# Patient Record
Sex: Female | Born: 1935 | Race: White | Hispanic: No | Smoking: Former smoker
Health system: Southern US, Community
[De-identification: ages and names within clinical notes are randomized; demographics above are authoritative.]

## PROBLEM LIST (undated history)

## (undated) DIAGNOSIS — I10 Essential (primary) hypertension: Secondary | ICD-10-CM

## (undated) DIAGNOSIS — I609 Nontraumatic subarachnoid hemorrhage, unspecified: Secondary | ICD-10-CM

## (undated) DIAGNOSIS — I671 Cerebral aneurysm, nonruptured: Secondary | ICD-10-CM

## (undated) DIAGNOSIS — H5440 Blindness, one eye, unspecified eye: Secondary | ICD-10-CM

## (undated) DIAGNOSIS — F329 Major depressive disorder, single episode, unspecified: Secondary | ICD-10-CM

## (undated) DIAGNOSIS — D7581 Myelofibrosis: Secondary | ICD-10-CM

## (undated) DIAGNOSIS — Z720 Tobacco use: Secondary | ICD-10-CM

## (undated) DIAGNOSIS — F32A Depression, unspecified: Secondary | ICD-10-CM

## (undated) HISTORY — PX: CATARACT EXTRACTION: SUR2

## (undated) HISTORY — PX: CHOLECYSTECTOMY: SHX55

## (undated) HISTORY — DX: Myelofibrosis: D75.81

## (undated) HISTORY — PX: ABDOMINAL HYSTERECTOMY: SHX81

## (undated) HISTORY — PX: ANEURYSM COILING: SHX5349

---

## 1998-02-20 ENCOUNTER — Ambulatory Visit (HOSPITAL_COMMUNITY): Admission: RE | Admit: 1998-02-20 | Discharge: 1998-02-20 | Payer: Self-pay

## 2004-04-02 ENCOUNTER — Ambulatory Visit: Payer: Self-pay | Admitting: Unknown Physician Specialty

## 2004-09-01 ENCOUNTER — Ambulatory Visit: Payer: Self-pay | Admitting: Internal Medicine

## 2005-05-04 ENCOUNTER — Ambulatory Visit: Payer: Self-pay | Admitting: Unknown Physician Specialty

## 2006-02-07 DIAGNOSIS — I609 Nontraumatic subarachnoid hemorrhage, unspecified: Secondary | ICD-10-CM

## 2006-02-07 HISTORY — DX: Nontraumatic subarachnoid hemorrhage, unspecified: I60.9

## 2006-03-03 ENCOUNTER — Inpatient Hospital Stay (HOSPITAL_COMMUNITY): Admission: EM | Admit: 2006-03-03 | Discharge: 2006-03-24 | Payer: Self-pay | Admitting: Neurosurgery

## 2006-03-03 ENCOUNTER — Ambulatory Visit: Payer: Self-pay | Admitting: Internal Medicine

## 2006-03-03 ENCOUNTER — Emergency Department: Payer: Self-pay | Admitting: Internal Medicine

## 2006-03-24 ENCOUNTER — Inpatient Hospital Stay (HOSPITAL_COMMUNITY)
Admission: RE | Admit: 2006-03-24 | Discharge: 2006-04-05 | Payer: Self-pay | Admitting: Physical Medicine & Rehabilitation

## 2006-03-24 ENCOUNTER — Ambulatory Visit: Payer: Self-pay | Admitting: Physical Medicine & Rehabilitation

## 2006-04-10 ENCOUNTER — Encounter: Payer: Self-pay | Admitting: Physical Medicine & Rehabilitation

## 2006-04-20 ENCOUNTER — Encounter: Payer: Self-pay | Admitting: Interventional Radiology

## 2006-05-04 ENCOUNTER — Ambulatory Visit: Payer: Self-pay | Admitting: Physical Medicine & Rehabilitation

## 2006-05-04 ENCOUNTER — Encounter
Admission: RE | Admit: 2006-05-04 | Discharge: 2006-08-02 | Payer: Self-pay | Admitting: Physical Medicine & Rehabilitation

## 2006-05-09 ENCOUNTER — Encounter: Payer: Self-pay | Admitting: Physical Medicine & Rehabilitation

## 2006-05-19 ENCOUNTER — Ambulatory Visit (HOSPITAL_COMMUNITY): Admission: RE | Admit: 2006-05-19 | Discharge: 2006-05-19 | Payer: Self-pay | Admitting: Interventional Radiology

## 2006-05-31 ENCOUNTER — Emergency Department: Payer: Self-pay | Admitting: Emergency Medicine

## 2006-06-02 ENCOUNTER — Ambulatory Visit (HOSPITAL_COMMUNITY): Admission: RE | Admit: 2006-06-02 | Discharge: 2006-06-02 | Payer: Self-pay | Admitting: Interventional Radiology

## 2006-06-09 ENCOUNTER — Emergency Department: Payer: Self-pay | Admitting: Unknown Physician Specialty

## 2006-06-12 ENCOUNTER — Encounter: Payer: Self-pay | Admitting: Physical Medicine & Rehabilitation

## 2006-08-21 ENCOUNTER — Ambulatory Visit: Payer: Self-pay | Admitting: Physical Medicine & Rehabilitation

## 2006-08-21 ENCOUNTER — Encounter
Admission: RE | Admit: 2006-08-21 | Discharge: 2006-08-22 | Payer: Self-pay | Admitting: Physical Medicine & Rehabilitation

## 2007-02-26 ENCOUNTER — Ambulatory Visit (HOSPITAL_COMMUNITY): Admission: RE | Admit: 2007-02-26 | Discharge: 2007-02-26 | Payer: Self-pay | Admitting: Interventional Radiology

## 2007-04-24 ENCOUNTER — Ambulatory Visit: Payer: Self-pay | Admitting: Ophthalmology

## 2007-05-08 ENCOUNTER — Ambulatory Visit: Payer: Self-pay | Admitting: Internal Medicine

## 2007-07-17 ENCOUNTER — Ambulatory Visit: Payer: Self-pay | Admitting: Ophthalmology

## 2007-07-23 ENCOUNTER — Ambulatory Visit: Payer: Self-pay | Admitting: Ophthalmology

## 2007-08-24 ENCOUNTER — Ambulatory Visit (HOSPITAL_COMMUNITY): Admission: RE | Admit: 2007-08-24 | Discharge: 2007-08-24 | Payer: Self-pay | Admitting: Interventional Radiology

## 2007-11-22 IMAGING — CT CT HEAD W/O CM
1 of 2 series · 15 of 30 positions shown, 19 images · non-contrast
Comparison: 03/13/2006.

CLINICAL DATA: Followup subarachnoid and intraventricular hemorrhage.

HEAD CT WITHOUT CONTRAST
TECHNIQUE: 5mm collimated images were obtained from the base of the skull
through the vertex according to standard protocol without contrast.

[Series 2: brain · axial · 0.47mm/px · z∈[+140,+271]mm · 15 of 64 slices shown, 19 images]
[im 4/64  brain]
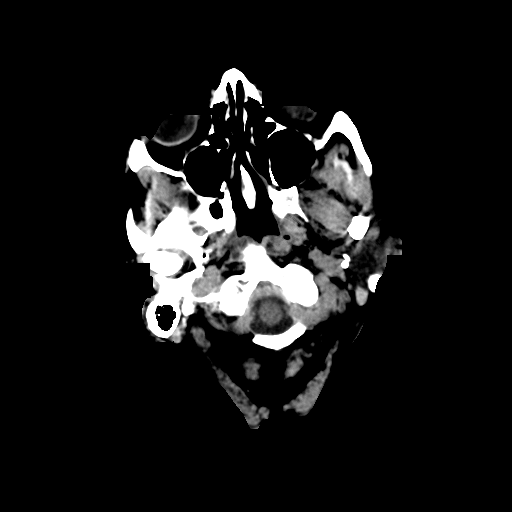
[im 4/64  bone]
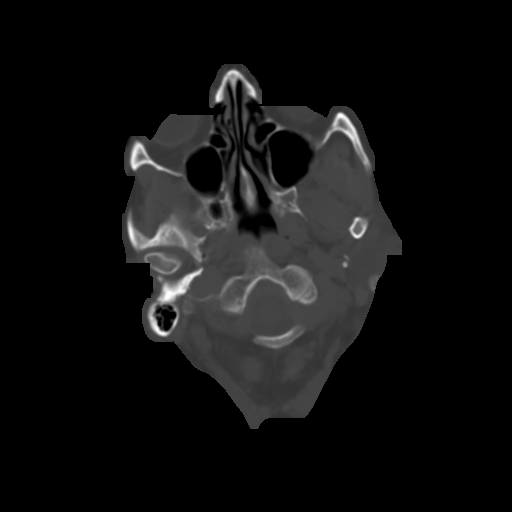
[im 8/64  brain]
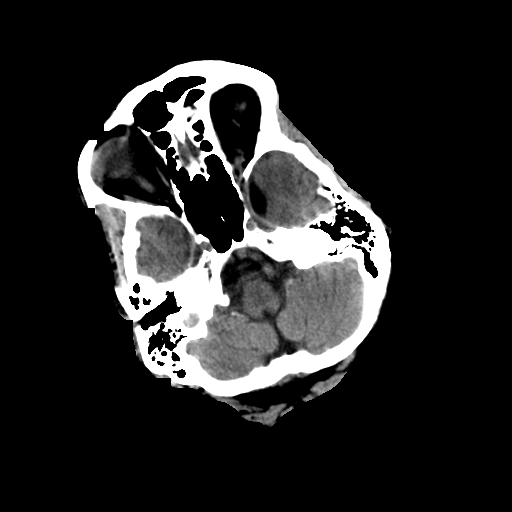
[im 12/64  brain]
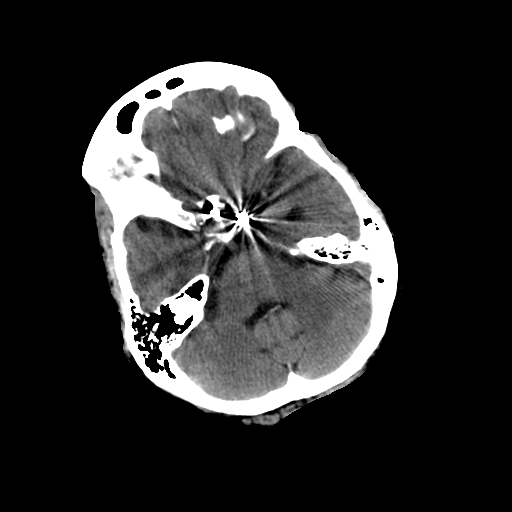
[im 16/64  brain]
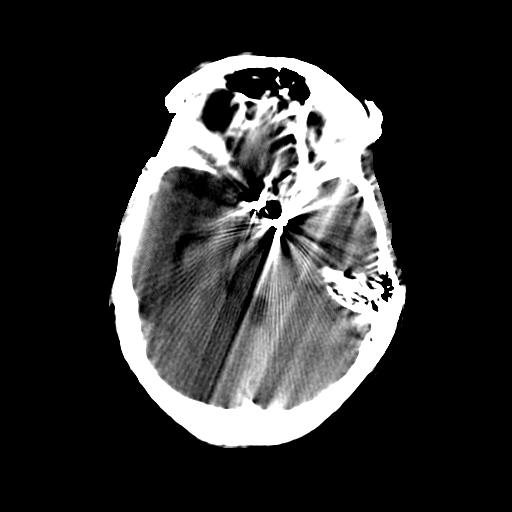
[im 20/64  brain]
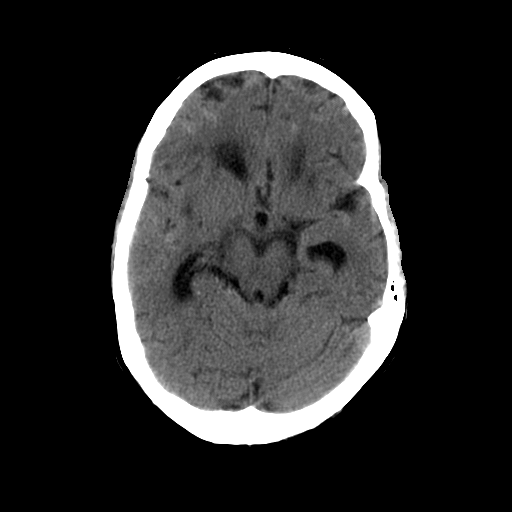
[im 20/64  bone]
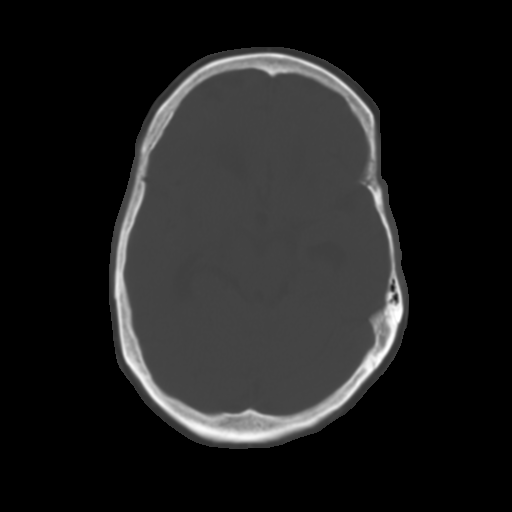
[im 24/64  brain]
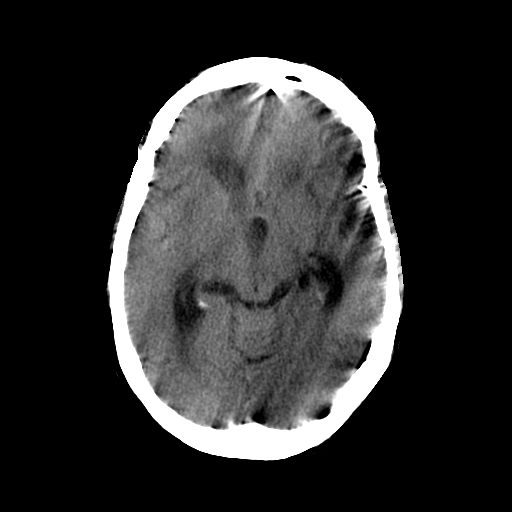
[im 28/64  brain]
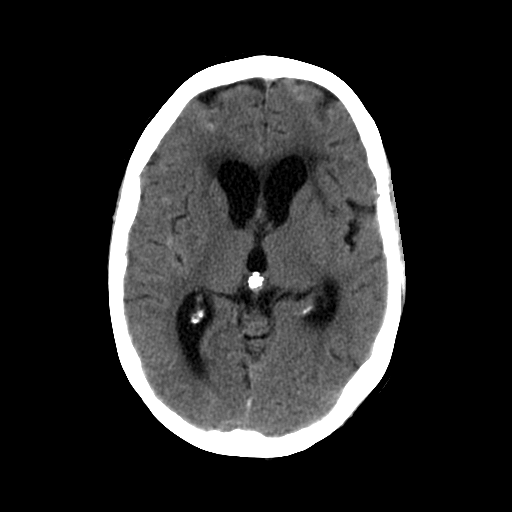
[im 32/64  brain]
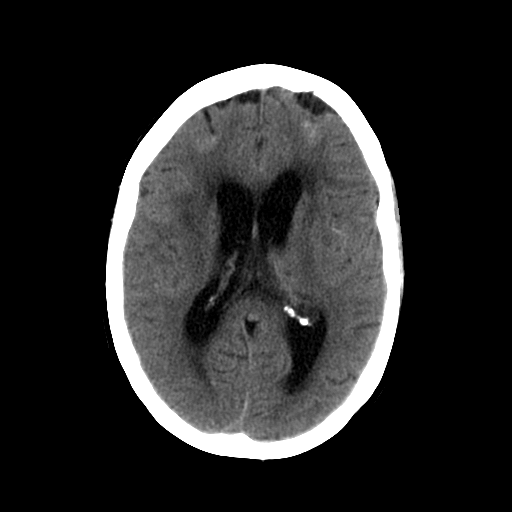
[im 36/64  brain]
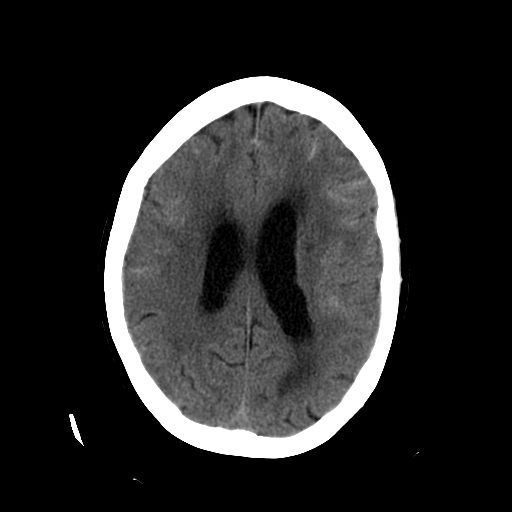
[im 36/64  bone]
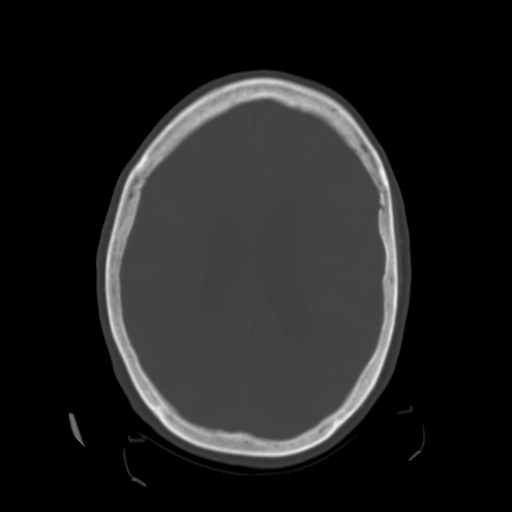
[im 40/64  brain]
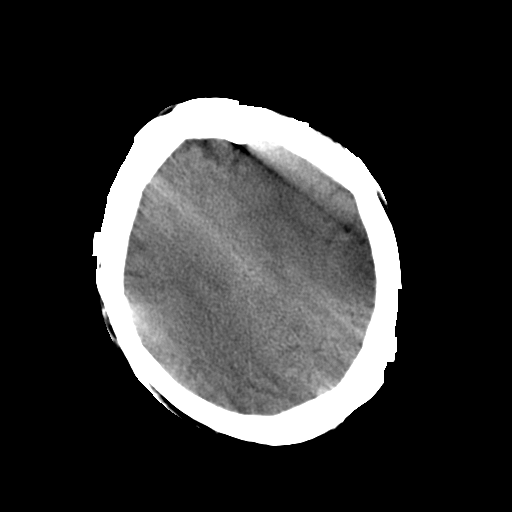
[im 44/64  brain]
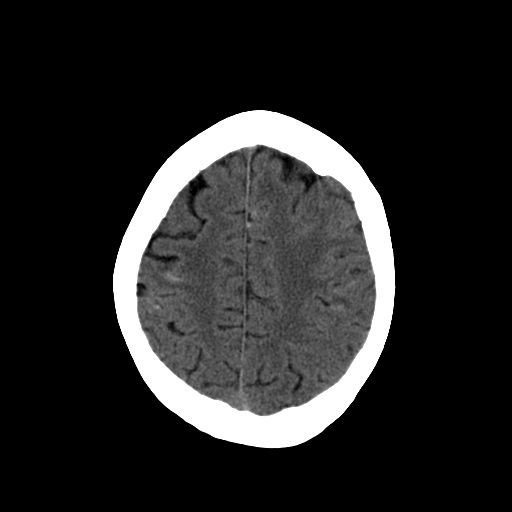
[im 48/64  brain]
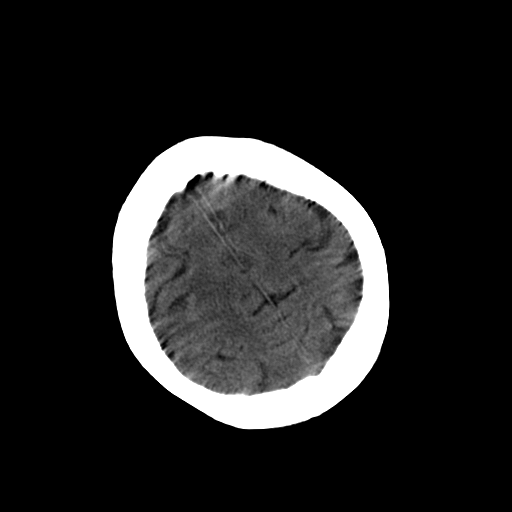
[im 52/64  brain]
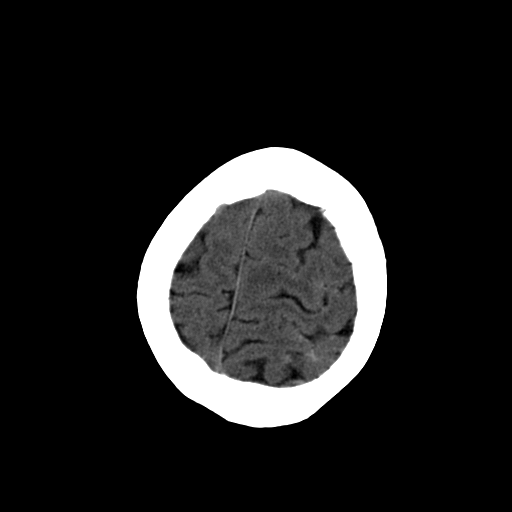
[im 52/64  bone]
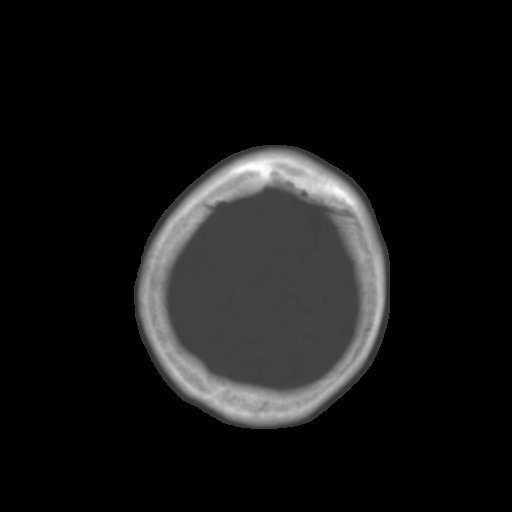
[im 56/64  brain]
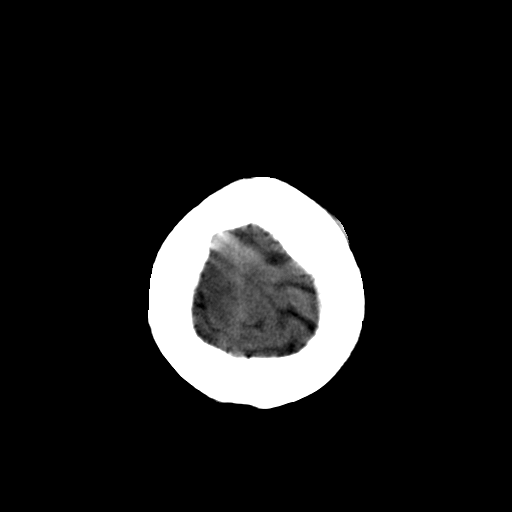
[im 60/64  brain]
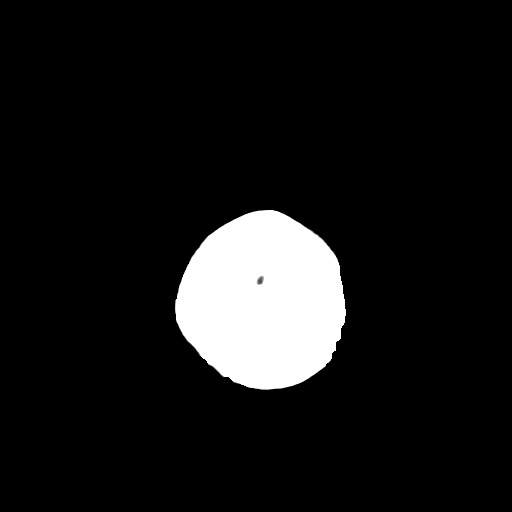

[15 of 30 positions shown; findings below may reference images not displayed]

FINDINGS: Interval decrease in subarachnoid and intraventricular hemorrhage.
Stable mild enlargement of the ventricles. Patchy white matter low density in
the cerebral hemispheres is more prominent. And left parietal lacunar infarct
and small bilateral basal ganglia lacunar infarcts are stable. No no new
hemorrhage or mass-effect. Stable aneurysm coils. A right nasal feeding tube is
in place.

IMPRESSION

1. Improving subarachnoid and intraventricular hemorrhage.
2. Stable mild hydrocephalus.
3. Progressively visible small vessel white matter ischemic changes in both
cerebral hemispheres.
4. Stable left parietal and bilateral basal ganglia lacunar infarcts.

## 2008-04-17 ENCOUNTER — Ambulatory Visit (HOSPITAL_COMMUNITY): Admission: RE | Admit: 2008-04-17 | Discharge: 2008-04-17 | Payer: Self-pay | Admitting: Interventional Radiology

## 2008-07-29 ENCOUNTER — Ambulatory Visit: Payer: Self-pay | Admitting: Internal Medicine

## 2008-08-20 ENCOUNTER — Ambulatory Visit: Payer: Self-pay | Admitting: Unknown Physician Specialty

## 2009-01-14 ENCOUNTER — Ambulatory Visit (HOSPITAL_COMMUNITY): Admission: RE | Admit: 2009-01-14 | Discharge: 2009-01-14 | Payer: Self-pay | Admitting: Interventional Radiology

## 2009-02-16 ENCOUNTER — Encounter: Payer: Self-pay | Admitting: Interventional Radiology

## 2009-04-07 ENCOUNTER — Inpatient Hospital Stay (HOSPITAL_COMMUNITY): Admission: RE | Admit: 2009-04-07 | Discharge: 2009-04-08 | Payer: Self-pay | Admitting: Interventional Radiology

## 2009-04-22 ENCOUNTER — Ambulatory Visit (HOSPITAL_COMMUNITY): Admission: RE | Admit: 2009-04-22 | Discharge: 2009-04-22 | Payer: Self-pay | Admitting: Interventional Radiology

## 2009-10-22 ENCOUNTER — Ambulatory Visit: Payer: Self-pay | Admitting: Internal Medicine

## 2009-10-30 ENCOUNTER — Ambulatory Visit (HOSPITAL_COMMUNITY): Admission: RE | Admit: 2009-10-30 | Discharge: 2009-10-30 | Payer: Self-pay | Admitting: Interventional Radiology

## 2010-04-22 LAB — CBC
MCHC: 31.7 g/dL (ref 30.0–36.0)
Platelets: 338 10*3/uL (ref 150–400)

## 2010-04-22 LAB — BASIC METABOLIC PANEL
BUN: 16 mg/dL (ref 6–23)
Chloride: 106 mEq/L (ref 96–112)
Creatinine, Ser: 1.02 mg/dL (ref 0.4–1.2)
Glucose, Bld: 98 mg/dL (ref 70–99)
Potassium: 4.2 mEq/L (ref 3.5–5.1)
Sodium: 142 mEq/L (ref 135–145)

## 2010-04-22 LAB — PROTIME-INR
INR: 0.92 (ref 0.00–1.49)
Prothrombin Time: 12.6 s (ref 11.6–15.2)

## 2010-04-22 LAB — APTT: aPTT: 30 s (ref 24–37)

## 2010-04-26 ENCOUNTER — Other Ambulatory Visit (HOSPITAL_COMMUNITY): Payer: Self-pay | Admitting: Interventional Radiology

## 2010-04-26 DIAGNOSIS — I729 Aneurysm of unspecified site: Secondary | ICD-10-CM

## 2010-04-26 DIAGNOSIS — I609 Nontraumatic subarachnoid hemorrhage, unspecified: Secondary | ICD-10-CM

## 2010-04-28 LAB — CBC
HCT: 39.9 % (ref 36.0–46.0)
MCHC: 34.4 g/dL (ref 30.0–36.0)
MCV: 95.8 fL (ref 78.0–100.0)
RBC: 4.17 MIL/uL (ref 3.87–5.11)
WBC: 8.9 10*3/uL (ref 4.0–10.5)

## 2010-04-28 LAB — DIFFERENTIAL
Basophils Relative: 0 % (ref 0–1)
Eosinophils Absolute: 0.5 10*3/uL (ref 0.0–0.7)
Eosinophils Relative: 6 % — ABNORMAL HIGH (ref 0–5)
Lymphs Abs: 1.8 10*3/uL (ref 0.7–4.0)
Monocytes Absolute: 0.5 10*3/uL (ref 0.1–1.0)
Monocytes Relative: 6 % (ref 3–12)
Neutrophils Relative %: 69 % (ref 43–77)

## 2010-04-28 LAB — BASIC METABOLIC PANEL
BUN: 16 mg/dL (ref 6–23)
CO2: 29 mEq/L (ref 19–32)
Chloride: 105 mEq/L (ref 96–112)
Creatinine, Ser: 0.88 mg/dL (ref 0.4–1.2)
GFR calc Af Amer: >60 mL/min (ref >60)
Potassium: 4.1 mEq/L (ref 3.5–5.1)

## 2010-05-02 LAB — CBC
HCT: 28.1 % — ABNORMAL LOW (ref 36.0–46.0)
Hemoglobin: 12.7 g/dL (ref 12.0–15.0)
Hemoglobin: 9.7 g/dL — ABNORMAL LOW (ref 12.0–15.0)
MCHC: 34.6 g/dL (ref 30.0–36.0)
MCV: 95.9 fL (ref 78.0–100.0)
MCV: 97.2 fL (ref 78.0–100.0)
RBC: 2.93 MIL/uL — ABNORMAL LOW (ref 3.87–5.11)
RBC: 3.83 MIL/uL — ABNORMAL LOW (ref 3.87–5.11)
WBC: 7.8 10*3/uL (ref 4.0–10.5)
WBC: 8.6 10*3/uL (ref 4.0–10.5)

## 2010-05-02 LAB — BASIC METABOLIC PANEL
CO2: 25 mEq/L (ref 19–32)
Chloride: 108 mEq/L (ref 96–112)
Creatinine, Ser: 0.73 mg/dL (ref 0.4–1.2)
GFR calc Af Amer: >60 mL/min (ref >60)
Potassium: 3.5 mEq/L (ref 3.5–5.1)
Sodium: 138 mEq/L (ref 135–145)

## 2010-05-02 LAB — PROTIME-INR: Prothrombin Time: 14.7 seconds (ref 11.6–15.2)

## 2010-05-02 LAB — HEPARIN LEVEL (UNFRACTIONATED): Heparin Unfractionated: 0.16 IU/mL — ABNORMAL LOW (ref 0.30–0.70)

## 2010-05-04 ENCOUNTER — Ambulatory Visit (HOSPITAL_COMMUNITY): Admission: RE | Admit: 2010-05-04 | Payer: MEDICARE | Source: Ambulatory Visit

## 2010-05-04 ENCOUNTER — Ambulatory Visit (HOSPITAL_COMMUNITY)
Admission: RE | Admit: 2010-05-04 | Discharge: 2010-05-04 | Disposition: A | Discharge status: Home / Self Care | Payer: MEDICARE | Source: Ambulatory Visit | Attending: Interventional Radiology | Admitting: Interventional Radiology

## 2010-05-04 ENCOUNTER — Other Ambulatory Visit (HOSPITAL_COMMUNITY): Payer: Self-pay | Admitting: Interventional Radiology

## 2010-05-04 DIAGNOSIS — I609 Nontraumatic subarachnoid hemorrhage, unspecified: Secondary | ICD-10-CM

## 2010-05-04 DIAGNOSIS — I729 Aneurysm of unspecified site: Secondary | ICD-10-CM

## 2010-05-04 DIAGNOSIS — R51 Headache: Secondary | ICD-10-CM | POA: Insufficient documentation

## 2010-05-04 DIAGNOSIS — I671 Cerebral aneurysm, nonruptured: Secondary | ICD-10-CM | POA: Insufficient documentation

## 2010-05-04 LAB — BUN: BUN: 16 mg/dL (ref 6–23)

## 2010-05-04 LAB — CREATININE, SERUM: GFR calc non Af Amer: 57 mL/min — ABNORMAL LOW (ref >60)

## 2010-05-04 MED ORDER — GADOBENATE DIMEGLUMINE 529 MG/ML IV SOLN
13.0000 mL | Freq: Once | INTRAVENOUS | Status: AC
  Administered 2010-05-04: 13 mL via INTRAVENOUS

## 2010-05-11 LAB — CBC
Hemoglobin: 14.8 g/dL (ref 12.0–15.0)
MCV: 97.1 fL (ref 78.0–100.0)
RBC: 4.47 MIL/uL (ref 3.87–5.11)
WBC: 9 10*3/uL (ref 4.0–10.5)

## 2010-05-11 LAB — PROTIME-INR
INR: 0.95 (ref 0.00–1.49)
Prothrombin Time: 12.6 seconds (ref 11.6–15.2)

## 2010-05-11 LAB — BASIC METABOLIC PANEL
Calcium: 8.9 mg/dL (ref 8.4–10.5)
Chloride: 101 mEq/L (ref 96–112)
Creatinine, Ser: 0.9 mg/dL (ref 0.4–1.2)
GFR calc Af Amer: >60 mL/min (ref >60)
Sodium: 138 mEq/L (ref 135–145)

## 2010-05-20 LAB — CBC
HCT: 41.5 % (ref 36.0–46.0)
Hemoglobin: 14.4 g/dL (ref 12.0–15.0)
RBC: 4.38 MIL/uL (ref 3.87–5.11)
RDW: 14.1 % (ref 11.5–15.5)
WBC: 10.2 10*3/uL (ref 4.0–10.5)

## 2010-05-20 LAB — BASIC METABOLIC PANEL
Calcium: 9 mg/dL (ref 8.4–10.5)
GFR calc Af Amer: >60 mL/min (ref >60)
GFR calc non Af Amer: >60 mL/min (ref >60)
Sodium: 142 mEq/L (ref 135–145)

## 2010-05-20 LAB — PROTIME-INR: INR: 1 (ref 0.00–1.49)

## 2010-05-20 LAB — APTT: aPTT: 29 seconds (ref 24–37)

## 2010-06-22 NOTE — Assessment & Plan Note (Signed)
FOLLOWUP OFFICE NOTE   [name] is back regarding her left subarachnoid hemorrhage.  She is now  home alone and doing well.  She denies pain.  She has had some extra  stress related to her son, who has been very ill and staying with her.  She has driven some short distances without problems.  She has had no  problem coming off the Ritalin.  She still has some issues with day-to-  day memory, but apparently is safe within the house according to the  daughter.  Blood pressure has been improved.   REVIEW OF SYSTEMS:  Notable for occasional bowel and bladder control  problems.  Other pertinent positives listed below and full review is in  the written health and history section.   SOCIAL HISTORY:  The patient is widowed.   PHYSICAL EXAM:  Blood pressure 151/68, pulse is 66, respiratory rate is  18.  She is satting 95% on room air.  The patient is pleasant, alert and oriented x3.  Affect is bright and  appropriate.  She occasionally has some word-finding issues, but this is  minimal.  She has some issues with attention and focus at times, but usually when  she takes her time and settles down, she does fairly well.  Reflexes are  2+.  Coordination is excellent.  Sensation is intact.  She really has  minimal side-to-side differences in strength and really was near 5/5  bilaterally.  She had some difficulty with sequencing some numbers  today, but when she took her time, she was able to perform the task.  She was able to spell the word world forward and backwards without  significant problems.  She displayed fair insight and awareness overall.   ASSESSMENT:  1. Subarachnoid hemorrhage.  2. Hypertension.  3. History of depression.   PLAN:  1. The patient is doing well at this point.  I gave her permission to      drive locally during the day.  2. Encourage use of regular memory book/calendar, which she needs to      keep on her person throughout the day.  She still has lapses in  short term memory and attention, which this will help.  3. I will see her back on an as needed basis in the future.  She will      follow up with Dr. Clearance Coots for further medical care.      Ranelle Oyster, M.D.  Electronically Signed     ZTS/MedQ  D:  08/22/2006 13:10:23  T:  08/22/2006 22:00:01  Job #:  [id number]   cc:   Diona Fanti  Fax: [phone]

## 2010-06-25 NOTE — Discharge Summary (Signed)
[name], [name]            ACCOUNT NO.:  [id number]   MEDICAL RECORD NO.:  [id number]          PATIENT TYPE:  INP   LOCATION:  3103                         FACILITY:  [facility]   PHYSICIAN:  Clydene Fake, M.D.  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   PROCEDURES PERFORMED:  Coiling of both aneurysms.   REASON FOR ADMISSION:  The patient is a 77 year old woman with sudden  headache, nausea and vomiting, with lethargy.  [facility] to [name]ency room.  CT scan showed subarachnoid hemorrhage and the patient  was transferred to [facility] and admitted to the ICU.   HOSPITAL COURSE:  The patient was lethargic, but arousable, followed  commands on admission.  CT scan did show some early hydrocephalus, and a  significant fixed subarachnoid hemorrhage.  The patient had an angiogram  and found to have A-COM aneurysm, small; and a left P-COM aneurysm;  unsure which hemorrhaged, but our guess was the P-COM aneurysm, but it  was hard to tell.  After consultation with Dr. Corliss Skains, it was thought  to go ahead and coil these aneurysms, and she underwent that procedure  on March 04, 2006.  She was then admitted back to the ICU.   Repeat CT scan showed a slight increase in the ventricular size, but she  was able to open her eyes spontaneously to voice, follow commands.  She  was re intubated at that point but continued support.  She was able to  extubated by the 27th.  Again, oriented x one, followed commands, and  was symmetric.  Another CT scan was done showing less dense subarachnoid  hemorrhage, the same ventricular size, so we still held off on  ventriculostomy since she was neurologically stable.  Her TCDs were then  started to be followed  and they were followed throughout the first 2-1/2  weeks of her admission and remained normal throughout.   We continued to watch her very carefully in the ICU, placed an NG tube  for starting tube feedings.  Started watching the serial sodiums and  increased her fluids to start triple-H therapy, as she was on Nimotop  through this period of time.  On February 29 she was even oriented x 2,  no drift, a little more awake, and continued making progress, but very  first, a little more confused.  We started increasing her fluids,  monitoring CVP through a PICC line and watching her sodiums.  We  continued with another CT scan on February 4, showed ventricles stable  with less subarachnoid blood.  She was a bit more confused, though no  focal findings.  Sodium did drop and she ended up being started on 3%  sodium, was already on some tube feeding-type extra sodium chloride.  We  did get an A-line in and started her on dopamine to push her pressure up  which only minimally worked, but we kept her  on that a few days.  She  remained stable, would follow commands, oriented x one, face symmetric.  Sodium was stable on the 3%.  TCDs never changed through this period of  time.   We were able to wean her off dopamine on March 17, 2006, and work on  getting rid of the 3% sodium and sodiums remained stable.  We slowly  decreased her oral sodium also.  Repeated CT scans.  The ventricles were  stable and with much less subarachnoid blood.  Started to increase her  activity, working with therapies.  She was eating more and more, and we  stopped her tube feeds during the day and then, on February 14, stopped  tube feedings altogether.  She was transferred to the stepdown unit on  February 12.  We started a lot more transfers and had her working with  therapies.  Rehab was consulted and, after evaluating her and discussing  with the family, it was decided she would be a good rehab candidate, and  was  accepted to rehab; and March 24, 2006, we discharged her from the  acute hospital into the rehab service for continued care.  She is off  Nimotop, off the extra sodium.  Follow-up will need to be with Dr.  Corliss Skains to follow up his coiling; and with her primary medical doctor  for hypertension medical control; and we would see her on a p.r.n.  basis.           ______________________________  Clydene Fake, M.D.     JRH/MEDQ  D:  03/24/2006  T:  03/25/2006  Job:  [id number]

## 2010-06-25 NOTE — H&P (Signed)
[name], [name]            ACCOUNT NO.:  [id number]   MEDICAL RECORD NO.:  [id number]          PATIENT TYPE:  IPS   LOCATION:  4027                         FACILITY:  [facility]   PHYSICIAN:  Ranelle Oyster, M.D.DATE OF BIRTH:  1928/06/07   DATE OF ADMISSION:  03/24/2006  DATE OF DISCHARGE:                              HISTORY & PHYSICAL   CHIEF COMPLAINT:  Confusion, aphasia, as well as weakness.   HISTORY OF PRESENT ILLNESS:  This 77 year old white female with a  history of hypertension and depression admitted to [facility] initially with subarachnoid hemorrhage in hypertensive crisis.  Transferred to [facility] on March 03, 2006.  The patient had an  emergent cerebral angiogram and endovascular obliteration of the left  PCOM and an anterior communicating artery region aneurysm on March 04, 2006 by Dr. Corliss Skains.  The patient was placed on Nimotop for blood  pressure control and vasospasm.  Patient has been placed on a D2 thin  liquid diet after initially being on Panda tube feeds.  The patient has  had some anxiety and restlessness since admission.  Some question of  waxing and waning of language and cognitive status.  The patient has  come off of the Nimotop currently per protocol.  She presents today with  ongoing functional and cognitive deficits, which affects her ability to  provide her own care and mobility.   REVIEW OF SYSTEMS:  Is positive for the above.  Full review is in the  written H&P.   PAST MEDICAL HISTORY:  Is positive for:  1. Hypertension.  2. Depression.  3. Hysterectomy.  4. Cholecystectomy.  5. Eye surgery.   There is no reported alcohol or tobacco use.   FAMILY HISTORY:  Is negative.   SOCIAL HISTORY:  The patient is retired.  Lives with her son who can  assist her as needed apparently.  She is completely independent prior to  arrival.   ALLERGIES:  NONE.   MEDS AT HOME:  Include:  1. Lexapro 10 mg daily.  2.  Diovan 160/12.5 b.i.d.  3. Zoloft 50 mg daily.   LABS:  Include hemoglobin 13.5, white count 16,000.  Sodium 140,  potassium 3.7, BUN and creatinine 11 and 0.6.   PHYSICAL EXAMINATION:  VITAL SIGNS:  The patient is afebrile and vital  signs were stable today.  HEENT:  Pupils are equal, round, and reactive to light and  accommodation.  Extraocular eye movements are intact.  Nose and throat  exam was difficult to obtained, although the mucosa appeared pink and  moist.  CHEST:  Was clear to auscultation bilaterally without wheezes, rales or  rhonchi.  HEART:  Was regular rate and rhythm without murmurs, rubs, or gallops.  ABDOMEN:  Soft and nontender.  SKIN:  Was intact.  NEUROLOGICAL EXAMINATION:  I found no focal cranial nerve deficits  today.  The patient was globally aphasic.  She did utter a few  intelligible phrases.  She was able to nod yes to most questions.  She  was able to follow simple commands.  There is some question of some  apraxia as well.  She has right inattention.  She did withdraw to pain  in all four extremities and moved all four extremities, although was  unable to test full motor skills.   ASSESSMENT AND PLAN:  1. Functional deficit secondary to subarachnoid hemorrhage with      generalized weakness and global aphasia/dysphasia.  He will be in      comprehensive inpatient rehab with PT to assess and treat for range      of motion, strengthening and transfers equipment.  OT will assess      and treat for range of motion, strengthening, ADLs,      cognitive/perceptual training.  A 24-hour rehab nurse will follow      up bowel, bladder, skin and medication issues.  Speech will follow      up for dysphasia and language deficits.  Case management/social      worker will assess for psychosocial needs and discharge planning.      Estimated length of stay is two weeks.  Goals:  Supervision.      Occasional min-assist the language difficulties.  Prognosis is       fair.  2. Hypertension:  Nimotop has stopped.  May need to resume on blood      pressure medications.  Observe daily pressures for now.  3. Mood:  Resume Lexapro and Zoloft as prior to arrival.  4. Sleep:  Monitor sleep cycle 8 p.m. to 8 a.m.  5. Deep venous thrombosis prophylaxis:  Thigh high TED hose.      Ranelle Oyster, M.D.  Electronically Signed     ZTS/MEDQ  D:  03/24/2006  T:  03/25/2006  Job:  [id number]   cc:   Dr. Clearance Coots

## 2010-06-25 NOTE — Assessment & Plan Note (Signed)
[name] is here in follow up of her subarachnoid hemorrhage.  She was  discharged from rehab on April 05, 2006 and has been with her family  on 24-hour supervision.  She has been receiving outpatient therapies in  [place].  She has been making gradual gains.  She is independent with  her bathing, toileting and feeding.  She needs supervision for meal prep  and household duties still.  Her son reports safety awareness issues.  The patient has been impulsive at times.  She still has problems with  short term memory, although he notes no obvious safety issues within the  home.  She does like to go out of the house and sometimes is risky with  her gait and walks on uneven surfaces which puts her at risk for  falling.  She uses a cane sometimes out of the house as well as walker  for longer distances.   The patient does report a rash and pruritus lately.  She feels that it  may be due to the Ritalin but she is not sure.  Ritalin has been  continued until yesterday when she ran out.  Her son feels that she  still could benefit from this due to poor memory and attention.  The  patient does occasionally get anxious, but it is more irritability than  anything else that she suffers from when she becomes fatigued.   REVIEW OF SYSTEMS:  Pertinent positives as listed above.  The patient  still has some urgency with bowel and bladder.  Sleep is fair.  She has  had great appetite.  Other pertinent positives listed above.   SOCIAL HISTORY:  The patient is widowed and living with the family as  mentioned above.  The family is very supportive.   PHYSICAL EXAMINATION:  VITAL SIGNS:  Blood pressure is 160/97, pulse is  88, respiratory rate 16.  The patient is sating 96% on room air.  GENERAL:  The patient is pleasant.  She is alert and oriented x3.  Affect is bright and appropriate.  Gait is normal for regular stride.  She had difficulty with heel and toe ambulation.  Motor function is  generally  5/5 as was sensory function.  The patient was easily able to  follow 1 step commands.  She needed extra time for 3 step commands due  to poor attention and memory.  No tremor was seen today.  No spasticity  was seen.  reflexes are 2+.  She was able to organize simple information  for me.  She is able to sequence days of the month.  She did have some  word finding deficits at times and difficulty with more higher level  organizational tasks.  HEART:  Regular.  CHEST:  Clear.  ABDOMEN:  Soft, nontender.   ASSESSMENT:  1. Subarachnoid hemorrhage.  2. Hypertension.  3. History of depression.   PLAN:  1. We will hold the Ritalin for now to see how she does this week off      the medication in respect to her rash as well as her attention to      focus.  My guess is that she will need to continue with the      medication on the short term to improve upon these levels.  If her      rash stops after coming off the Ritalin, certainly we will then      stop Ritalin for the long run.  2. I asked the patient to  be keeping a memory book to organize her      daily tasks.  I think that she is at the point where her family can      leave her alone for short periods of time to see how she does from      a safety standpoint.  The patient needs to buy into the supervision      aspect of her life and learn to be realistic about her goals and      function in the short term.  She wants to be better yesterday but      has to realize that this is a long process.  Overall I am very      pleased with her progress as she has made extraordinary gains since      I last saw her.  I will see her back in about 3 months' time.  3. I did increase her Diovan HCT to 320/12.5 today due to ongoing      hypertension.  4. We need to continue Lexapro.      Ranelle Oyster, M.D.  Electronically Signed     ZTS/MedQ  D:  05/08/2006 13:56:46  T:  05/08/2006 14:37:28  Job #:  [id number]   cc:   Diona Fanti  Fax:  [phone]

## 2010-06-25 NOTE — Discharge Summary (Signed)
[name], [name]            ACCOUNT NO.:  [id number]   MEDICAL RECORD NO.:  [id number]          PATIENT TYPE:  IPS   LOCATION:  4027                         FACILITY:  [facility]   PHYSICIAN:  Ranelle Oyster, M.D.DATE OF BIRTH:  1928/04/14   DATE OF ADMISSION:  03/24/2006  DATE OF DISCHARGE:  04/05/2006                               DISCHARGE SUMMARY   DISCHARGE DIAGNOSES:  1. Subarachnoid hemorrhage with endovascular treatment secondary to      rupture, March 03, 2006.  2. Pain control.  3. Hypertension.  4. Depression.  5. Enterobacter urinary tract infection, resolved.   HISTORY:  A 77 year old white female transferred from [facility] with subarachnoid hemorrhage and hypertensive crisis,  January 25, to [facility].  Cerebral angiogram with  endovascular obliteration of left PCOM and ACM artery region aneurysm,  March 04, 2006, per Dr. Corliss Skains.  Placed on Nimotop for blood  pressure vasospasm control.  Bedside swallow evaluation, January 31,  placed on dysphagia II thin liquid diet.  Initially with Panda tube  feeds; these were since discontinued.  Bouts of restlessness and  monitoring of mental status with a bed alarm in place for safety.  Her  Nimotop was later discontinued on March 24, 2006 per Neurosurgery.  She was admitted for a comprehensive rehab program.   PAST MEDICAL HISTORY:  See discharge diagnoses.   HABITS:  No alcohol or tobacco.   ALLERGIES:  None.   SOCIAL HISTORY:  Retired, lives with son and assistance as needed.   MEDICATIONS PRIOR TO ADMISSION:  1. Lexapro 10 mg daily.  2. Diovan 160/12.5 mg twice daily.  3. Sertraline 50 mg daily.   REHABILITATION HOSPITAL COURSE:  The patient was admitted to inpatient  rehab services with therapies initiated on a 3-hour daily basis  consisting of physical therapy, occupational therapy, speech therapy and  rehabilitation nursing.  The following issues were addressed  during the  patient's rehabilitation stay:  Pertaining to [name]'s  subarachnoid hemorrhage, she had undergone endovascular treatment  secondary to rupture, March 03, 2006, per Dr. Corliss Skains.  She is doing  quite nicely.  Dr. Fatima Sanger office was contacted prior to the  patient's departure from the hospital with recommendations to follow up  and call for appointment.  She was using only Tylenol as needed for pain  with good results, initially on Nimotop for blood pressure control; this  was discontinued February 15.  She is presently on no antihypertensive  medications with diastolic pressures 54-65.  She had been on Diovan  prior to hospital admission.  She would follow up with Dr. Diona Fanti  in [place], [place].  She had her Lexapro resumed for history  of depression and monitoring.  She was quite motivated for her therapy.  She continued to progress in all areas.   Overall, for functional status, she was close supervision for  ambulation, minimal assistance without an assist device and minimal  assist for stairs.  She is minimal assist for functional communication.  She still had trouble with some word-finding difficulties.  Her diet was  advanced to a  mechanical soft, again of which she tolerated quite  nicely.  Initially, she was quite impulsive; again, this improved.  Bed  alarm was initially used for safety; this was later discontinued.   Latest labs showed an Enterobacter urinary tract infection which was  treated with Bactrim for 5 days.  She denied any dysuria or hematuria.  Sodium 142, potassium 3.4, BUN 25, creatinine 0.9.  Hemoglobin 12,  hematocrit 34.6, WBC 5.5, platelet 414,000.   DISCHARGE MEDICATIONS AT TIME OF DICTATION:  1. Lexapro 10 mg daily.  2. Ritalin 5 mg twice daily at 7 a.m. and noon, which had been      initiated during her hospital stay to help the patient focus better      and regained any attention deficits.  3. Tylenol as  needed.   ACTIVITY:  As tolerated.  Advised no driving, no smoking, no alcohol.  Full family teaching was completed.   DISPOSITION:  She was discharged to home.   FOLLOWUP:  She would follow up with Dr. Faith Rogue at the outpatient  rehab center, Dr. Corliss Skains of Interventional Radiology, call for  appointment, and Dr. Leane Call of [place], [facility], medical  management.      Mariam Dollar, P.A.      Ranelle Oyster, M.D.  Electronically Signed    DA/MEDQ  D:  04/04/2006  T:  04/04/2006  Job:  [id number]   cc:   Patton Salles. Corliss Skains, M.D.  Coletta Memos, M.D.

## 2010-06-25 NOTE — Consult Note (Signed)
[name], [name]            ACCOUNT NO.:  [id number]   MEDICAL RECORD NO.:  [id number]          PATIENT TYPE:  OUT   LOCATION:  XRAY                         FACILITY:  [facility]   PHYSICIAN:  Sanjeev K. Deveshwar, M.D.DATE OF BIRTH:  [birth year]   DATE OF CONSULTATION:  04/20/2006  DATE OF DISCHARGE:  04/20/2006                                 CONSULTATION   DATE OF CONSULTATION:  April 20, 2006.   CHIEF COMPLAINT:  Status post aneurysm coiling.   HISTORY OF PRESENT ILLNESS:  This is a very pleasant 77 year old female  who was admitted to [facility] on March 03, 2006 by Dr. Colon Branch with a subarachnoid hemorrhage.  She presented with a  hypertensive crisis I believe to [facility] and was transferred  to [facility] for further evaluation.   On March 04, 2006, the patient underwent a cerebral angiogram  performed by Dr. Corliss Skains with subsequent coiling of a 5 mm x 3.5 mm  left posterior communicating artery aneurysm and coiling of a 3.5 x 2.5  mm anterior communicating artery aneurysm.  The patient tolerated the  procedure well.  She was admitted to the neuro intensive care unit at  [facility] where she made very slow progress.  She eventually  was admitted to the [facility] rehabilitation center where she  had physical, speech and occupational therapy.  She was discharged from  the rehab unit on March 24, 2006.  She now resides at home with her  son in [place].  She continues to participate in physical,  occupational and speech therapy at the [facility].   PAST MEDICAL HISTORY:  Significant for depression, hypertension and the  above-noted subarachnoid hemorrhage.  Apparently the patient ran out of  her blood pressure medications approximately 2 weeks prior to her  hemorrhage and at time of presentation she had an extremely elevated  blood pressure.   PAST SURGICAL HISTORY:  1. Cholecystectomy.  2.  Hysterectomy.  3. Eye surgery.   ALLERGIES:  No known drug allergies.   CURRENT MEDICATIONS:  1. Lexapro 10 mg daily.  2. Ritalin 5 mg b.i.d.  3. Tylenol p.r.n.   SOCIAL HISTORY:  The patient is widowed.  She has three children.  She  lives with her son in [place].  She quit smoking at the time of  admission with her subarachnoid hemorrhage.  She did smoke a pack per  day for at least 50 years.  She does not use alcohol.  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   FAMILY HISTORY:  Mother died at age 52 from throat cancer.  Her father  died at age 45 from cancer of the brain.  There is no family history of  aneurysms.  She had three brothers who have died of cancer.  One brother  who recently had prostate cancer diagnosed and treated.   IMPRESSION AND PLAN:  As noted the patient returns today to be seen in  follow-up with Dr. Corliss Skains after undergoing coiling of two cerebral  artery aneurysms on March 04, 2006, in  the setting of a subarachnoid  hemorrhage.  The patient is accompanied by her son and daughter.  Overall, the patient looks great at this time.  Her communication skills  have improved significantly.  She is completely oriented x3.  She does  admit to occasionally losing her train of thought in mid sentence  although she converses appropriately today.  She is  ambulating  household distances independently.  She uses a walker when outside the  home.  As noted, she is still participating in physical, occupational,  and speech therapies.  She is anxious to start driving again.  However,  she will no doubt need to retake a driving test prior to considering  resuming driving.  The patient's family reports that she tires easily.  They notice a significant difference in her strength and her thought  processes by the end of the day.  They understand the need for  supervision and they usually have someone with her throughout the day.   Dr.  Corliss Skains reviewed the images of the before and after angiogram  pictures.  The coiled aneurysms were pointed out.  He did recommend a  follow-up cerebral angiogram in approximately 3 months to further  evaluate the stability of the aneurysms.  The patient was also cautioned  not to resume smoking and to be sure her blood pressure is kept under  good control.  All of their questions were answered.   Greater than 40 minutes was spent on this consult.  Dr. Corliss Skains also  recommended that the patient follow-up with Dr. Phoebe Perch.      Delton See, P.A.    ______________________________  [place] Silos. Corliss Skains, M.D.    DR/MEDQ  D:  04/20/2006  T:  04/22/2006  Job:  [id number]   cc:   Clydene Fake, M.D.  Ranelle Oyster, M.D.  Diona Fanti

## 2010-10-25 ENCOUNTER — Ambulatory Visit: Payer: Self-pay | Admitting: Family Medicine

## 2010-10-28 LAB — BASIC METABOLIC PANEL
BUN: 17
Chloride: 106
Creatinine, Ser: 0.83
GFR calc Af Amer: >60
GFR calc non Af Amer: >60
Potassium: 4

## 2010-10-28 LAB — CBC
HCT: 39.2
Platelets: 273
RBC: 4.19
WBC: 6.9

## 2010-10-28 LAB — PROTIME-INR: Prothrombin Time: 12.8

## 2010-11-05 LAB — CBC
HCT: 40.2
Hemoglobin: 13.7
RBC: 4.28

## 2010-11-05 LAB — BASIC METABOLIC PANEL
Calcium: 8.8
GFR calc Af Amer: >60
GFR calc non Af Amer: >60
Potassium: 3.9
Sodium: 143

## 2010-11-05 LAB — PROTIME-INR: INR: 1

## 2011-03-09 ENCOUNTER — Telehealth (HOSPITAL_COMMUNITY): Payer: Self-pay

## 2011-03-21 ENCOUNTER — Telehealth (HOSPITAL_COMMUNITY): Payer: Self-pay

## 2011-04-26 NOTE — Telephone Encounter (Signed)
Contacts         Type  Contact  Phone    03/21/2011 3:13 PM  Phone (Outgoing)  BOGER,DEBBIE (Emergency Contact)  [phone] (H)    Completed- spoke w/ Mrs Willette Brace about scheduling MRI/MRA need updated ins. pt to call back when she returns from her sons house

## 2011-04-26 NOTE — Telephone Encounter (Signed)
Contacts         Type  Contact  Phone    03/09/2011 11:15 AM  Phone (Outgoing)  BOGER,DEBBIE (Emergency Contact)  [phone] (H)    Left Message- lft message to call to set up pts mri mra f/u scans

## 2011-06-28 ENCOUNTER — Other Ambulatory Visit (HOSPITAL_COMMUNITY): Payer: Self-pay | Admitting: Interventional Radiology

## 2011-06-28 DIAGNOSIS — I729 Aneurysm of unspecified site: Secondary | ICD-10-CM

## 2011-06-29 ENCOUNTER — Other Ambulatory Visit: Payer: Self-pay | Admitting: Radiology

## 2011-06-30 ENCOUNTER — Ambulatory Visit (HOSPITAL_COMMUNITY)
Admission: RE | Admit: 2011-06-30 | Discharge: 2011-06-30 | Disposition: A | Discharge status: Home / Self Care | Payer: Medicare Other | Source: Ambulatory Visit | Attending: Interventional Radiology | Admitting: Interventional Radiology

## 2011-06-30 ENCOUNTER — Encounter (HOSPITAL_COMMUNITY): Payer: Self-pay

## 2011-06-30 ENCOUNTER — Other Ambulatory Visit (HOSPITAL_COMMUNITY): Payer: Self-pay | Admitting: Interventional Radiology

## 2011-06-30 DIAGNOSIS — I10 Essential (primary) hypertension: Secondary | ICD-10-CM | POA: Insufficient documentation

## 2011-06-30 DIAGNOSIS — I729 Aneurysm of unspecified site: Secondary | ICD-10-CM

## 2011-06-30 DIAGNOSIS — F17200 Nicotine dependence, unspecified, uncomplicated: Secondary | ICD-10-CM | POA: Insufficient documentation

## 2011-06-30 DIAGNOSIS — I671 Cerebral aneurysm, nonruptured: Secondary | ICD-10-CM | POA: Insufficient documentation

## 2011-06-30 HISTORY — DX: Essential (primary) hypertension: I10

## 2011-06-30 HISTORY — DX: Tobacco use: Z72.0

## 2011-06-30 HISTORY — DX: Blindness, one eye, unspecified eye: H54.40

## 2011-06-30 HISTORY — DX: Depression, unspecified: F32.A

## 2011-06-30 HISTORY — DX: Nontraumatic subarachnoid hemorrhage, unspecified: I60.9

## 2011-06-30 HISTORY — DX: Cerebral aneurysm, nonruptured: I67.1

## 2011-06-30 HISTORY — DX: Major depressive disorder, single episode, unspecified: F32.9

## 2011-06-30 LAB — APTT: aPTT: 32 seconds (ref 24–37)

## 2011-06-30 LAB — BASIC METABOLIC PANEL
Chloride: 105 mEq/L (ref 96–112)
GFR calc Af Amer: 59 mL/min — ABNORMAL LOW (ref >90)
Potassium: 3.9 mEq/L (ref 3.5–5.1)
Sodium: 143 mEq/L (ref 135–145)

## 2011-06-30 LAB — CBC
HCT: 36.1 % (ref 36.0–46.0)
Hemoglobin: 11.1 g/dL — ABNORMAL LOW (ref 12.0–15.0)
RBC: 3.78 MIL/uL — ABNORMAL LOW (ref 3.87–5.11)
RDW: 15.8 % — ABNORMAL HIGH (ref 11.5–15.5)
WBC: 5.5 10*3/uL (ref 4.0–10.5)

## 2011-06-30 LAB — PROTIME-INR: INR: 0.95 (ref 0.00–1.49)

## 2011-06-30 MED ORDER — SODIUM CHLORIDE 0.9 % IV SOLN: INTRAVENOUS | Status: AC

## 2011-06-30 MED ORDER — FENTANYL CITRATE 0.05 MG/ML IJ SOLN
INTRAMUSCULAR | Status: DC | PRN
  Administered 2011-06-30 (×2): 25 ug via INTRAVENOUS

## 2011-06-30 MED ORDER — MIDAZOLAM HCL 5 MG/5ML IJ SOLN
INTRAMUSCULAR | Status: DC | PRN
  Administered 2011-06-30: 1 mg via INTRAVENOUS

## 2011-06-30 MED ORDER — IOHEXOL 300 MG/ML  SOLN
150.0000 mL | Freq: Once | INTRAMUSCULAR | Status: AC | PRN
  Administered 2011-06-30: 50 mL via INTRAVENOUS

## 2011-06-30 MED ORDER — SODIUM CHLORIDE 0.45 % IV SOLN
INTRAVENOUS | Status: DC
  Administered 2011-06-30: 1000 mL via INTRAVENOUS

## 2011-06-30 MED ORDER — HYDRALAZINE HCL 20 MG/ML IJ SOLN
INTRAMUSCULAR | Status: AC
  Filled 2011-06-30: qty 1

## 2011-06-30 MED ORDER — FENTANYL CITRATE 0.05 MG/ML IJ SOLN
INTRAMUSCULAR | Status: AC
  Filled 2011-06-30: qty 4

## 2011-06-30 MED ORDER — SODIUM CHLORIDE 0.45 % IV BOLUS
100.0000 mL | Freq: Once | INTRAVENOUS | Status: AC
  Administered 2011-06-30: 100 mL via INTRAVENOUS

## 2011-06-30 MED ORDER — MIDAZOLAM HCL 2 MG/2ML IJ SOLN
INTRAMUSCULAR | Status: AC
  Filled 2011-06-30: qty 4

## 2011-06-30 MED ORDER — HEPARIN SOD (PORK) LOCK FLUSH 100 UNIT/ML IV SOLN
INTRAVENOUS | Status: DC | PRN
  Administered 2011-06-30: 500 [IU] via INTRAVENOUS

## 2011-06-30 NOTE — Procedures (Signed)
S/P 4 vessel cerebral arteriogram. RT CFA approach. Preliminary findings. 1..Obliterated Acom aneurysm,and nearly completely obliterated Lt Pcom aneurysm 2.4.35mmx 3.41mm Rt Pcom aneurysm

## 2011-06-30 NOTE — ED Notes (Signed)
O2 d/c'd 

## 2011-06-30 NOTE — H&P (Signed)
[name] is an 78 y.o. female.   Chief Complaint: intracranial aneurysm on left - s/p intravascular treatment. HPI: MRA examination last performed suggested new findings of possible aneurysms not previously noted. Patient presents today for formal cerebral angiogram to further evaluate for definitive diagnosis and treatment planning if suggested.  Past Medical History  Diagnosis Date  . Aneurysm of anterior cerebral artery   . SAH (subarachnoid hemorrhage) 02/2006  . Hypertension   . Tobacco abuse   . Depression   . Blindness of left eye     secondary to cataract surgery complication    Past Surgical History  Procedure Date  . Cholecystectomy   . Abdominal hysterectomy   . Cataract extraction   . Aneurysm coiling     Social History:  does not have a smoking history on file. She does not have any smokeless tobacco history on file. Her alcohol and drug histories not on file.  Allergies: No Known Allergies   Results for orders placed during the hospital encounter of 06/30/11 (from the past 48 hour(s))  BASIC METABOLIC PANEL     Status: Abnormal   Collection Time   06/30/11  8:38 AM      Component Value Range Comment   Sodium 143  135 - 145 (mEq/L)    Potassium 3.9  3.5 - 5.1 (mEq/L)    Chloride 105  96 - 112 (mEq/L)    CO2 28  19 - 32 (mEq/L)    Glucose, Bld 97  70 - 99 (mg/dL)    BUN 19  6 - 23 (mg/dL)    Creatinine, Ser 4.09  0.50 - 1.10 (mg/dL)    Calcium 9.4  8.4 - 10.5 (mg/dL)    GFR calc non Af Amer 51 (*) >90 (mL/min)    GFR calc Af Amer 59 (*) >90 (mL/min)   CBC     Status: Abnormal   Collection Time   06/30/11  8:38 AM      Component Value Range Comment   WBC 5.5  4.0 - 10.5 (K/uL)    RBC 3.78 (*) 3.87 - 5.11 (MIL/uL)    Hemoglobin 11.1 (*) 12.0 - 15.0 (g/dL)    HCT 81.1  91.4 - 78.2 (%)    MCV 95.5  78.0 - 100.0 (fL)    MCH 29.4  26.0 - 34.0 (pg)    MCHC 30.7  30.0 - 36.0 (g/dL)    RDW 95.6 (*) 21.3 - 15.5 (%)    Platelets 365  150 - 400 (K/uL)      Review of Systems  Constitutional: Positive for malaise/fatigue. Negative for fever and chills.  HENT: Negative.   Eyes:       Left eye blindness   Respiratory: Negative.   Cardiovascular: Positive for leg swelling. Negative for chest pain and palpitations.  Gastrointestinal: Negative.   Genitourinary: Negative.   Skin: Negative.   Neurological: Negative.   Psychiatric/Behavioral: Positive for depression.    Physical Exam  Constitutional: She is oriented to person, place, and time. She appears well-developed and well-nourished. No distress.  HENT:  Head: Normocephalic and atraumatic.  Eyes: Pupils are equal, round, and reactive to light.  Cardiovascular: Normal rate, regular rhythm and intact distal pulses.  Exam reveals no gallop and no friction rub.   Murmur heard. Respiratory: Effort normal and breath sounds normal. No respiratory distress. She has no wheezes. She has no rales.  GI: Soft. Bowel sounds are normal.  Musculoskeletal: Normal range of motion. She exhibits no  edema and no tenderness.  Neurological: She is alert and oriented to person, place, and time.  Psychiatric: She has a normal mood and affect. Her behavior is normal. Judgment and thought content normal.     Assessment/Plan Patient and daughter talked with in regards to cerebral angiogram procedure. Patient has undergone in past and is aware of procedure. Potential complications reviewed were that of possible infection, bleeding, vessel damage, contrast reaction, complications with moderate sedation, and stroke with her apparent understanding.  Patient aware of benefits of this study to further evaluate MRA findings and to determine if any further treatment is necessary for aneurysms.  Written consent obtained.   [name] 06/30/2011, 9:32 AM

## 2011-06-30 NOTE — Discharge Instructions (Signed)

## 2011-08-16 ENCOUNTER — Telehealth (HOSPITAL_COMMUNITY): Payer: Self-pay

## 2011-08-16 NOTE — Telephone Encounter (Signed)
Dr. Shelah Lewandowsky PA Fransico Setters called asking for clearance to proceed with an IV sedation for a colonoscopy and endoscopy.  Dr. Corliss Skains stated it is ok to proceed.  I called Fransico Setters back to let her know as well as faxing a signed note to her.

## 2011-09-26 ENCOUNTER — Ambulatory Visit: Payer: Self-pay | Admitting: Unknown Physician Specialty

## 2012-06-21 ENCOUNTER — Ambulatory Visit: Payer: Self-pay | Admitting: Internal Medicine

## 2013-06-18 ENCOUNTER — Other Ambulatory Visit (HOSPITAL_COMMUNITY): Payer: Self-pay | Admitting: Interventional Radiology

## 2013-06-18 DIAGNOSIS — I729 Aneurysm of unspecified site: Secondary | ICD-10-CM

## 2013-07-03 ENCOUNTER — Ambulatory Visit (HOSPITAL_COMMUNITY)
Admission: RE | Admit: 2013-07-03 | Discharge: 2013-07-03 | Disposition: A | Discharge status: Home / Self Care | Payer: Medicare Other | Source: Ambulatory Visit | Attending: Interventional Radiology | Admitting: Interventional Radiology

## 2013-07-03 DIAGNOSIS — I671 Cerebral aneurysm, nonruptured: Secondary | ICD-10-CM | POA: Insufficient documentation

## 2013-07-03 DIAGNOSIS — I729 Aneurysm of unspecified site: Secondary | ICD-10-CM

## 2013-07-03 LAB — CREATININE, SERUM
CREATININE: 0.92 mg/dL (ref 0.50–1.10)
GFR, EST AFRICAN AMERICAN: 65 mL/min — AB (ref >90)
GFR, EST NON AFRICAN AMERICAN: 56 mL/min — AB (ref >90)

## 2013-07-03 MED ORDER — GADOBENATE DIMEGLUMINE 529 MG/ML IV SOLN
15.0000 mL | Freq: Once | INTRAVENOUS | Status: AC
  Administered 2013-07-03: 12 mL via INTRAVENOUS

## 2013-07-08 DIAGNOSIS — F32A Depression, unspecified: Secondary | ICD-10-CM | POA: Diagnosis present

## 2013-07-12 ENCOUNTER — Telehealth (HOSPITAL_COMMUNITY): Payer: Self-pay | Admitting: Interventional Radiology

## 2013-07-12 NOTE — Telephone Encounter (Signed)
Called pt, told her that we would contact her in 2 yrs for her next MRI/MRA f/u. She states understanding and is in agreement with this plan of care. JMichaux

## 2013-12-09 ENCOUNTER — Ambulatory Visit: Payer: Self-pay | Admitting: Internal Medicine

## 2015-06-11 ENCOUNTER — Other Ambulatory Visit (HOSPITAL_COMMUNITY): Payer: Self-pay | Admitting: Interventional Radiology

## 2015-06-11 DIAGNOSIS — I729 Aneurysm of unspecified site: Secondary | ICD-10-CM

## 2015-06-25 ENCOUNTER — Ambulatory Visit (HOSPITAL_COMMUNITY): Payer: Medicare Other

## 2015-06-25 ENCOUNTER — Ambulatory Visit (HOSPITAL_COMMUNITY)
Admission: RE | Admit: 2015-06-25 | Discharge: 2015-06-25 | Disposition: A | Discharge status: Home / Self Care | Payer: Medicare Other | Source: Ambulatory Visit | Attending: Interventional Radiology | Admitting: Interventional Radiology

## 2015-06-25 DIAGNOSIS — I639 Cerebral infarction, unspecified: Secondary | ICD-10-CM | POA: Diagnosis not present

## 2015-06-25 DIAGNOSIS — I729 Aneurysm of unspecified site: Secondary | ICD-10-CM

## 2015-06-25 DIAGNOSIS — I671 Cerebral aneurysm, nonruptured: Secondary | ICD-10-CM | POA: Diagnosis not present

## 2015-06-25 LAB — POCT I-STAT CREATININE: Creatinine, Ser: 0.9 mg/dL (ref 0.44–1.00)

## 2015-06-25 MED ORDER — GADOBENATE DIMEGLUMINE 529 MG/ML IV SOLN
10.0000 mL | Freq: Once | INTRAVENOUS | Status: AC | PRN
  Administered 2015-06-25: 10 mL via INTRAVENOUS

## 2015-07-08 ENCOUNTER — Other Ambulatory Visit (HOSPITAL_COMMUNITY): Payer: Self-pay | Admitting: Interventional Radiology

## 2015-07-08 DIAGNOSIS — I729 Aneurysm of unspecified site: Secondary | ICD-10-CM

## 2015-07-27 ENCOUNTER — Ambulatory Visit (HOSPITAL_COMMUNITY)
Admission: RE | Admit: 2015-07-27 | Discharge: 2015-07-27 | Disposition: A | Discharge status: Home / Self Care | Payer: Medicare Other | Source: Ambulatory Visit | Attending: Interventional Radiology | Admitting: Interventional Radiology

## 2015-07-27 DIAGNOSIS — I729 Aneurysm of unspecified site: Secondary | ICD-10-CM

## 2015-07-28 ENCOUNTER — Other Ambulatory Visit (HOSPITAL_COMMUNITY): Payer: Self-pay | Admitting: Interventional Radiology

## 2015-07-28 DIAGNOSIS — I771 Stricture of artery: Secondary | ICD-10-CM

## 2015-07-29 ENCOUNTER — Other Ambulatory Visit: Payer: Self-pay | Admitting: Radiology

## 2015-07-31 ENCOUNTER — Encounter (HOSPITAL_COMMUNITY): Payer: Self-pay

## 2015-07-31 ENCOUNTER — Other Ambulatory Visit (HOSPITAL_COMMUNITY): Payer: Self-pay | Admitting: Interventional Radiology

## 2015-07-31 ENCOUNTER — Ambulatory Visit (HOSPITAL_COMMUNITY)
Admission: RE | Admit: 2015-07-31 | Discharge: 2015-07-31 | Disposition: A | Discharge status: Home / Self Care | Payer: Medicare Other | Source: Ambulatory Visit | Attending: Interventional Radiology | Admitting: Interventional Radiology

## 2015-07-31 DIAGNOSIS — Z7982 Long term (current) use of aspirin: Secondary | ICD-10-CM | POA: Insufficient documentation

## 2015-07-31 DIAGNOSIS — I771 Stricture of artery: Secondary | ICD-10-CM

## 2015-07-31 DIAGNOSIS — I10 Essential (primary) hypertension: Secondary | ICD-10-CM | POA: Insufficient documentation

## 2015-07-31 DIAGNOSIS — R4701 Aphasia: Secondary | ICD-10-CM | POA: Diagnosis not present

## 2015-07-31 DIAGNOSIS — F329 Major depressive disorder, single episode, unspecified: Secondary | ICD-10-CM | POA: Diagnosis not present

## 2015-07-31 DIAGNOSIS — H540 Blindness, both eyes: Secondary | ICD-10-CM | POA: Insufficient documentation

## 2015-07-31 DIAGNOSIS — I671 Cerebral aneurysm, nonruptured: Secondary | ICD-10-CM | POA: Insufficient documentation

## 2015-07-31 DIAGNOSIS — R51 Headache: Secondary | ICD-10-CM | POA: Diagnosis present

## 2015-07-31 DIAGNOSIS — R29810 Facial weakness: Secondary | ICD-10-CM | POA: Diagnosis not present

## 2015-07-31 LAB — BASIC METABOLIC PANEL
Anion gap: 7 (ref 5–15)
BUN: 12 mg/dL (ref 6–20)
CHLORIDE: 107 mmol/L (ref 101–111)
CO2: 28 mmol/L (ref 22–32)
CREATININE: 0.99 mg/dL (ref 0.44–1.00)
Calcium: 9 mg/dL (ref 8.9–10.3)
GFR calc Af Amer: 59 mL/min — ABNORMAL LOW (ref >60)
GFR, EST NON AFRICAN AMERICAN: 51 mL/min — AB (ref >60)
Glucose, Bld: 86 mg/dL (ref 65–99)
Potassium: 4.2 mmol/L (ref 3.5–5.1)
SODIUM: 142 mmol/L (ref 135–145)

## 2015-07-31 LAB — CBC
HCT: 40.1 % (ref 36.0–46.0)
Hemoglobin: 12.2 g/dL (ref 12.0–15.0)
MCH: 29.8 pg (ref 26.0–34.0)
MCHC: 30.4 g/dL (ref 30.0–36.0)
MCV: 98 fL (ref 78.0–100.0)
PLATELETS: 519 10*3/uL — AB (ref 150–400)
RBC: 4.09 MIL/uL (ref 3.87–5.11)
RDW: 16.5 % — AB (ref 11.5–15.5)
WBC: 8.3 10*3/uL (ref 4.0–10.5)

## 2015-07-31 LAB — PROTIME-INR
INR: 1.16 (ref 0.00–1.49)
Prothrombin Time: 15 seconds (ref 11.6–15.2)

## 2015-07-31 LAB — APTT: APTT: 31 s (ref 24–37)

## 2015-07-31 MED ORDER — IOPAMIDOL (ISOVUE-300) INJECTION 61%
INTRAVENOUS | Status: AC
  Administered 2015-07-31: 66 mL
  Filled 2015-07-31: qty 150

## 2015-07-31 MED ORDER — MIDAZOLAM HCL 2 MG/2ML IJ SOLN
INTRAMUSCULAR | Status: AC
  Filled 2015-07-31: qty 2

## 2015-07-31 MED ORDER — FENTANYL CITRATE (PF) 100 MCG/2ML IJ SOLN
INTRAMUSCULAR | Status: AC
  Filled 2015-07-31: qty 2

## 2015-07-31 MED ORDER — HEPARIN SOD (PORK) LOCK FLUSH 100 UNIT/ML IV SOLN
1000.0000 [IU] | Freq: Once | INTRAVENOUS | Status: AC
  Administered 2015-07-31: 1000 [IU] via INTRAVENOUS

## 2015-07-31 MED ORDER — HEPARIN SOD (PORK) LOCK FLUSH 100 UNIT/ML IV SOLN: 500.0000 [IU] | Freq: Once | INTRAVENOUS | Status: DC

## 2015-07-31 MED ORDER — FENTANYL CITRATE (PF) 100 MCG/2ML IJ SOLN
INTRAMUSCULAR | Status: AC | PRN
  Administered 2015-07-31: 25 ug via INTRAVENOUS

## 2015-07-31 MED ORDER — HEPARIN SOD (PORK) LOCK FLUSH 100 UNIT/ML IV SOLN
INTRAVENOUS | Status: AC
Start: 2015-07-31 — End: 2015-07-31
  Filled 2015-07-31: qty 10

## 2015-07-31 MED ORDER — SODIUM CHLORIDE 0.9 % IV SOLN
Freq: Once | INTRAVENOUS | Status: AC
  Administered 2015-07-31: 09:00:00 via INTRAVENOUS

## 2015-07-31 MED ORDER — LIDOCAINE HCL 1 % IJ SOLN
INTRAMUSCULAR | Status: AC
  Administered 2015-07-31: 8 mL
  Filled 2015-07-31: qty 20

## 2015-07-31 MED ORDER — MIDAZOLAM HCL 2 MG/2ML IJ SOLN
INTRAMUSCULAR | Status: AC | PRN
  Administered 2015-07-31: 1 mg via INTRAVENOUS

## 2015-07-31 MED ORDER — SODIUM CHLORIDE 0.9 % IV SOLN: INTRAVENOUS | Status: AC

## 2015-07-31 MED ORDER — IOPAMIDOL (ISOVUE-300) INJECTION 61%
INTRAVENOUS | Status: AC
  Administered 2015-07-31: 1 mL
  Filled 2015-07-31: qty 50

## 2015-07-31 NOTE — H&P (Signed)
Chief Complaint: Patient was seen in consultation today for cerebral arteriogram at the request of Dr Tracie Harrier   Referring Physician(s): Dr Tracie Harrier  Supervising Physician: Luanne Bras  Patient Status: Outpatient  History of Present Illness: [name] is a 82 y.o. female   Known to [facility] Previous Anterior communicating artery aneurysm embolization 2008 Previous Lt Posterior communicating artery aneurysm embolization 2011 Followed with Dr Estanislado Pandy since then Noted symptoms of Rt sided weakness; Rt facial droop and word finding difficulty MRI/MRA 06/25/2015: IMPRESSION: 1. Stable intracranial MRA. 2. Unchanged to decreased flow in the neck or face of treated left posterior communicating artery aneurysm. 3. Unchanged 3-4 mm outpouching at the treated anterior communicating artery aneurysm. 4. Unchanged 4 mm right supraclinoid ICA aneurysm. 5. Small infarcts in the left cerebral hemisphere have increased since 2015 comparison with a developing watershed pattern. Flow gap in the stented left ICA is chronic but in stent stenosis could be superimposed.  Has been consulted with Dr Estanislado Pandy and feels best to perform cerebral arteriogram for full evaluation Symptoms remain of slight Rt side face droop; Rt arm weakness and word finding difficulty----better daily  Past Medical History  Diagnosis Date  . Aneurysm of anterior cerebral artery   . SAH (subarachnoid hemorrhage) ([place]) 02/2006  . Hypertension   . Tobacco abuse   . Depression   . Blindness of left eye     secondary to cataract surgery complication    Past Surgical History  Procedure Laterality Date  . Cholecystectomy    . Abdominal hysterectomy    . Cataract extraction    . Aneurysm coiling      Allergies: Review of patient's allergies indicates no known allergies.  Medications: Prior to Admission medications   Medication Sig Start Date End Date Taking? Authorizing Provider    aspirin 81 MG tablet Take 81 mg by mouth daily.   Yes Historical Provider, MD  citalopram (CELEXA) 20 MG tablet Take 20 mg by mouth daily.   Yes Historical Provider, MD  ferrous sulfate 325 (65 FE) MG tablet Take 325 mg by mouth daily with breakfast.   Yes Historical Provider, MD  lisinopril-hydrochlorothiazide (PRINZIDE,ZESTORETIC) 20-25 MG tablet Take 1 tablet by mouth daily.   Yes Historical Provider, MD  omeprazole (PRILOSEC) 20 MG capsule Take 20 mg by mouth every morning.   Yes Historical Provider, MD  Vitamin D, Ergocalciferol, (DRISDOL) 50000 units CAPS capsule Take 50,000 Units by mouth every Sunday.   Yes Historical Provider, MD     History reviewed. No pertinent family history.  Social History   Social History  . Marital Status: Widowed    Spouse Name: N/A  . Number of Children: 1  . Years of Education: N/A   Social History Main Topics  . Smoking status: None  . Smokeless tobacco: None  . Alcohol Use: None  . Drug Use: None  . Sexual Activity: Not Asked   Other Topics Concern  . None   Social History Narrative     Review of Systems: A 12 point ROS discussed and pertinent positives are indicated in the HPI above.  All other systems are negative.  Review of Systems  Constitutional: Negative for fever, activity change and fatigue.  HENT: Negative for tinnitus, trouble swallowing and voice change.   Eyes: Negative for photophobia and visual disturbance.  Respiratory: Negative for cough and shortness of breath.   Cardiovascular: Negative for chest pain.  Gastrointestinal: Negative for abdominal pain.  Neurological: Positive for facial  asymmetry, speech difficulty, weakness and numbness. Negative for dizziness, tremors, seizures, syncope, light-headedness and headaches.  Psychiatric/Behavioral: Negative for behavioral problems and confusion.    Vital Signs: BP 143/54 mmHg  Pulse 63  Temp(Src) 98.5 F (36.9 C) (Oral)  Resp 16  Ht 5\' 2"  (1.575 m)  Wt 113 lb  (51.256 kg)  BMI 20.66 kg/m2  SpO2 96%  Physical Exam  Constitutional: She is oriented to person, place, and time.  Thin and frail  HENT:  Face with slight Rt face droop  Eyes: EOM are normal.  Neck: Neck supple.  Cardiovascular: Normal rate, regular rhythm and normal heart sounds.   Pulmonary/Chest: Effort normal and breath sounds normal. She has no wheezes.  Abdominal: Soft. Bowel sounds are normal. There is no tenderness.  Musculoskeletal: Normal range of motion.  Neurological: She is alert and oriented to person, place, and time.  Speech noted difficult with some wording   Skin: Skin is warm and dry.  Psychiatric: She has a normal mood and affect. Her behavior is normal. Judgment and thought content normal.  Nursing note and vitals reviewed.   Mallampati Score:  MD Evaluation Airway: WNL Heart: WNL Abdomen: WNL Chest/ Lungs: WNL ASA  Classification: 3 Mallampati/Airway Score: One  Imaging: Ir Radiologist Eval & Mgmt  07/29/2015  EXAM: ESTABLISHED PATIENT OFFICE VISIT CHIEF COMPLAINT: New symptoms of right-sided weakness. Current Pain Level: 1-10 HISTORY OF PRESENT ILLNESS: The patient is an 82 year old, lady who is status post endovascular treatment of ruptured anterior communicating artery region aneurysm which was first treated in January of 2008. She subsequently underwent angiographic follow-ups with endovascular stent assisted coiling of left posterior communicating artery region aneurysm in March of 2011. Thereafter the patient was followed with intermittent catheter angiograms and MRI/MRA of the brain. Her most recent MRI/MRA of the brain was on Jun 25, 2015. The patient is accompanied by her son and daughter. According to them and also according to the patient, the patient developed a witnessed right-sided weakness involving the right arm and also right facial droop which lasted for about 6-8 hours about two months ago. This apparently resolved near completely. This was  also associated at the time with mild dysarthria which also nearly completely improved, but left her with some difficulty with word-finding. She subsequently reportedly had two spells of vertigo and passing out. Both times the patient did not seek medical attention. Since that time, the patient has been coping independently with minimal difficulty in the right upper extremity. She ambulates independently without any help or stumbling. She denies any visual aberrations, diplopia, amaurosis fugax, blindness, swallowing difficulties, hearing difficulties, subsequent motor weakness in the extremities. She denies any recent seizures or episodes of loss of consciousness. Her appetite remains very picky which is herself. Her weight has remains essentially unchanged also. She denies any other symptoms of chest pain, shortness of breath, dyspnea on exertion, palpitations, pedal edema. She denies any cough or hemoptysis or wheezing. She denies recent abdominal pain, nausea, vomiting, constipation or melena. The patient underwent recent MRI/MRA of the brain which is discussed below. Medications: The patient is supposed to be taking a baby aspirin every day. She admits to only taking it periodically. She is on Lexapro and lisinopril. She has no known drug allergies. She continues to smoke up to 1/4 to 1/2 a pack of cigarettes a day. She denies drinking alcohol or using illicit chemicals. Family history as per previously. Mother and dad both expired from cancers. Brother apparently died of an MI  at age 56. Patient's son died of cirrhosis at the age of 79. Brother died of leukemia at age 45. REVIEW OF SYSTEMS: As above. PHYSICAL EXAMINATION: In no acute distress.  Affect normal. Neurologically, she has a right upper motor neuron facial droop with intermittent word-finding difficulties though is able to hold a reasonable coherent conversation. Right upper extremity and right lower extremity or almost 5 out of 5 proximally and  distally. Coordination is intact to finger and finger-to-nose. Station and gait, patient walks normally without wide-based gait or falling to one side or the other. ASSESSMENT AND PLAN: The patient's recent MRI/MRA of the brain was reviewed with her and her family. Of note is the interval development of her recent remote ischemic infarcts involving the left cerebral hemisphere in the left frontal subcortical and left parietal regions. This would probably account for her recent symptoms of a right-sided weakness as described above about two months ago. The MRA again reveals the presence of a 3-4 mm residual neck remnant of the previously treated anterior communicating artery region aneurysm, with a 4-4.5 mm right internal carotid artery distal supraclinoid ICA aneurysm. The left internal carotid artery, posterior communicating artery region remains obliterated with significantly decreased caliber. Although this could be accounted for by the previously positioned stent with the coils, given the patient's recent history of new ischemic insults in the left cerebral hemisphere in the watershed area, question of whether there is an end stent stenosis is raised. The patient was given the option of a catheter angiogram to accurately evaluate the possibility of an end stent severe stenosis which could account for her recent watershed ischemic infarcts. This would also allow a more accurate interval assessment of the previously untreated intracranial aneurysms for further treatment planning. This was discussed in detail. The patient and her son are in favor to proceed with the diagnostic catheter angiograms. This will be scheduled at the earliest possible. In the meantime, patient has been again advised strongly to take 81 mg of aspirin every day without fail and to refrain from smoking. Questions were answered to their satisfaction. More recommendations to depend on findings of the diagnostic catheter cerebral angiogram.  Electronically Signed   By: Luanne Bras M.D.   On: 07/27/2015 16:56    Labs:  CBC:  Recent Labs  07/31/15 0859  WBC 8.3  HGB 12.2  HCT 40.1  PLT 519*    COAGS:  Recent Labs  07/31/15 0859  INR 1.16  APTT 31    BMP:  Recent Labs  06/25/15 1112 07/31/15 0859  NA  --  142  K  --  4.2  CL  --  107  CO2  --  28  GLUCOSE  --  86  BUN  --  12  CALCIUM  --  9.0  CREATININE 0.90 0.99  GFRNONAA  --  51*  GFRAA  --  59*    LIVER FUNCTION TESTS: No results for input(s): BILITOT, AST, ALT, ALKPHOS, PROT, ALBUMIN in the last 8760 hours.  TUMOR MARKERS: No results for input(s): AFPTM, CEA, CA199, CHROMGRNA in the last 8760 hours.  Assessment and Plan:  Hx ACOM aneurysm embo 2008 Hx L PCOM aneurysm embo 2011 New Rt side face droop and numbness in Rt arm; speech change x 2 mo Now for cerebral arteriogram for evaluation Risks and Benefits discussed with the patient including, but not limited to bleeding, infection, vascular injury, contrast induced renal failure, stroke or even death. All of the patient's questions were  answered, patient is agreeable to proceed. Consent signed and in chart.  Thank you for this interesting consult.  I greatly enjoyed meeting [name] and look forward to participating in their care.  A copy of this report was sent to the requesting provider on this date.  Electronically Signed: Monia Sabal A 07/31/2015, 9:56 AM   I spent a total of  30 Minutes   in face to face in clinical consultation, greater than 50% of which was counseling/coordinating care for cerebral arteriogram

## 2015-07-31 NOTE — Progress Notes (Signed)
Assumed care of patient from H[place], RN. Assessment documented.

## 2015-07-31 NOTE — Procedures (Signed)
S/P 4 vessel cerebral arteriogram RT CFA approach.. Findings. 1.Approx 4.9 mm x 3.54mm ACOM artery aneurysm. 2.Approx 8.38mm x 5.69mm RT ICA supraclnoid aneurysm with a wideneck post saccular component 4.38mm x 4.85mm.. 32.28mm x 1.7 mm neck remnant of previously treated Lt post com aneurysm.No coil compaction. 4.Mild FMD LT VA at C2

## 2015-07-31 NOTE — Procedures (Deleted)
No note

## 2015-07-31 NOTE — Discharge Instructions (Signed)
Angiogram, Care After °Refer to this sheet in the next few weeks. These instructions provide you with information about caring for yourself after your procedure. Your health care provider may also give you more specific instructions. Your treatment has been planned according to current medical practices, but problems sometimes occur. Call your health care provider if you have any problems or questions after your procedure. °WHAT TO EXPECT AFTER THE PROCEDURE °After your procedure, it is typical to have the following: °· Bruising at the catheter insertion site that usually fades within 1-2 weeks. °· Blood collecting in the tissue (hematoma) that may be painful to the touch. It should usually decrease in size and tenderness within 1-2 weeks. °HOME CARE INSTRUCTIONS °· Take medicines only as directed by your health care provider. °· You may shower 24-48 hours after the procedure or as directed by your health care provider. Remove the bandage (dressing) and gently wash the site with plain soap and water. Pat the area dry with a clean towel. Do not rub the site, because this may cause bleeding. °· Do not take baths, swim, or use a hot tub until your health care provider approves. °· Check your insertion site every day for redness, swelling, or drainage. °· Do not apply powder or lotion to the site. °· Do not lift over 10 lb (4.5 kg) for 5 days after your procedure or as directed by your health care provider. °· Ask your health care provider when it is okay to: °¨ Return to work or school. °¨ Resume usual physical activities or sports. °¨ Resume sexual activity. °· Do not drive home if you are discharged the same day as the procedure. Have someone else drive you. °· You may drive 24 hours after the procedure unless otherwise instructed by your health care provider. °· Do not operate machinery or power tools for 24 hours after the procedure or as directed by your health care provider. °· If your procedure was done as an  outpatient procedure, which means that you went home the same day as your procedure, a responsible adult should be with you for the first 24 hours after you arrive home. °· Keep all follow-up visits as directed by your health care provider. This is important. °SEEK MEDICAL CARE IF: °· You have a fever. °· You have chills. °· You have increased bleeding from the catheter insertion site. Hold pressure on the site. °SEEK IMMEDIATE MEDICAL CARE IF: °· You have unusual pain at the catheter insertion site. °· You have redness, warmth, or swelling at the catheter insertion site. °· You have drainage (other than a small amount of blood on the dressing) from the catheter insertion site. °· The catheter insertion site is bleeding, and the bleeding does not stop after 30 minutes of holding steady pressure on the site. °· The area near or just beyond the catheter insertion site becomes pale, cool, tingly, or numb. °  °This information is not intended to replace advice given to you by your health care provider. Make sure you discuss any questions you have with your health care provider. °  °Document Released: 08/12/2004 Document Revised: 02/14/2014 Document Reviewed: 06/27/2012 °Elsevier Interactive Patient Education ©2016 [employer] ° °

## 2015-08-03 ENCOUNTER — Other Ambulatory Visit (HOSPITAL_COMMUNITY): Payer: Self-pay | Admitting: Interventional Radiology

## 2015-08-03 DIAGNOSIS — I671 Cerebral aneurysm, nonruptured: Secondary | ICD-10-CM

## 2015-08-17 ENCOUNTER — Ambulatory Visit (HOSPITAL_COMMUNITY)
Admission: RE | Admit: 2015-08-17 | Discharge: 2015-08-17 | Disposition: A | Discharge status: Home / Self Care | Payer: Medicare Other | Source: Ambulatory Visit | Attending: Interventional Radiology | Admitting: Interventional Radiology

## 2015-08-17 DIAGNOSIS — I671 Cerebral aneurysm, nonruptured: Secondary | ICD-10-CM

## 2015-10-11 ENCOUNTER — Emergency Department: Payer: Medicare Other

## 2015-10-11 ENCOUNTER — Emergency Department
Admission: EM | Admit: 2015-10-11 | Discharge: 2015-10-12 | Disposition: A | Discharge status: Home / Self Care | Payer: Medicare Other | Attending: Emergency Medicine | Admitting: Emergency Medicine

## 2015-10-11 ENCOUNTER — Encounter: Payer: Self-pay | Admitting: Emergency Medicine

## 2015-10-11 DIAGNOSIS — M7989 Other specified soft tissue disorders: Secondary | ICD-10-CM

## 2015-10-11 DIAGNOSIS — M7121 Synovial cyst of popliteal space [Baker], right knee: Secondary | ICD-10-CM

## 2015-10-11 DIAGNOSIS — Z7982 Long term (current) use of aspirin: Secondary | ICD-10-CM | POA: Insufficient documentation

## 2015-10-11 DIAGNOSIS — Z79899 Other long term (current) drug therapy: Secondary | ICD-10-CM | POA: Insufficient documentation

## 2015-10-11 DIAGNOSIS — I10 Essential (primary) hypertension: Secondary | ICD-10-CM | POA: Diagnosis not present

## 2015-10-11 DIAGNOSIS — T148XXA Other injury of unspecified body region, initial encounter: Secondary | ICD-10-CM

## 2015-10-11 LAB — COMPREHENSIVE METABOLIC PANEL
ALK PHOS: 58 U/L (ref 38–126)
ALT: 16 U/L (ref 14–54)
AST: 29 U/L (ref 15–41)
Albumin: 3.9 g/dL (ref 3.5–5.0)
Anion gap: 5 (ref 5–15)
BUN: 23 mg/dL — ABNORMAL HIGH (ref 6–20)
CALCIUM: 8.8 mg/dL — AB (ref 8.9–10.3)
CO2: 27 mmol/L (ref 22–32)
CREATININE: 1.08 mg/dL — AB (ref 0.44–1.00)
Chloride: 109 mmol/L (ref 101–111)
GFR calc non Af Amer: 46 mL/min — ABNORMAL LOW (ref >60)
GFR, EST AFRICAN AMERICAN: 53 mL/min — AB (ref >60)
Glucose, Bld: 91 mg/dL (ref 65–99)
Potassium: 3.9 mmol/L (ref 3.5–5.1)
SODIUM: 141 mmol/L (ref 135–145)
Total Bilirubin: 0.5 mg/dL (ref 0.3–1.2)
Total Protein: 6.7 g/dL (ref 6.5–8.1)

## 2015-10-11 LAB — PROTIME-INR
INR: 1.06
Prothrombin Time: 13.8 seconds (ref 11.4–15.2)

## 2015-10-11 LAB — CBC
HEMATOCRIT: 31.4 % — AB (ref 35.0–47.0)
HEMOGLOBIN: 10.8 g/dL — AB (ref 12.0–16.0)
MCH: 33.1 pg (ref 26.0–34.0)
MCHC: 34.3 g/dL (ref 32.0–36.0)
MCV: 96.4 fL (ref 80.0–100.0)
Platelets: 618 10*3/uL — ABNORMAL HIGH (ref 150–440)
RBC: 3.25 MIL/uL — AB (ref 3.80–5.20)
RDW: 17.4 % — ABNORMAL HIGH (ref 11.5–14.5)
WBC: 14.9 10*3/uL — ABNORMAL HIGH (ref 3.6–11.0)

## 2015-10-11 LAB — APTT: aPTT: 39 seconds — ABNORMAL HIGH (ref 24–36)

## 2015-10-11 NOTE — ED Notes (Signed)
Patient transported to Ultrasound 

## 2015-10-11 NOTE — ED Triage Notes (Signed)
Pt presents to ED with right calf pain with right lower leg and foot swelling. Calf painful with palpation per family. No redness noted. Pt states she started to notice slight pain yesterday but worsened today. Pt family concerned this evening when pt was unable to ambulate at home due to her increase in pain.

## 2015-10-12 MED ORDER — ONDANSETRON 4 MG PO TBDP
4.0000 mg | ORAL_TABLET | Freq: Once | ORAL | Status: AC
  Administered 2015-10-12: 4 mg via ORAL
  Filled 2015-10-12: qty 1

## 2015-10-12 MED ORDER — HYDROCODONE-ACETAMINOPHEN 5-325 MG PO TABS: 1.0000 | ORAL_TABLET | Freq: Four times a day (QID) | ORAL | 0 refills | Status: DC | PRN

## 2015-10-12 MED ORDER — HYDROCODONE-ACETAMINOPHEN 5-325 MG PO TABS
1.0000 | ORAL_TABLET | Freq: Once | ORAL | Status: AC
  Administered 2015-10-12: 1 via ORAL
  Filled 2015-10-12: qty 1

## 2015-10-12 NOTE — Discharge Instructions (Signed)
1. You may unwrap your leg 2 days and sleep. 2. Elevate affected area and apply ice several times daily. 3. Your ultrasound shows either a cyst to the back of your knee or a collection of broken blood vessels. Please follow-up with the orthopedic doctor for recheck next week. 4. You may take Norco as needed for pain. 5. Use walker to ambulate and for balance. 6. Return to the ER for worsening symptoms, persistent vomiting, difficulty breathing or other concerns.

## 2015-10-12 NOTE — ED Notes (Signed)
Pt back to room att 

## 2015-10-12 NOTE — ED Provider Notes (Signed)
[facility] Emergency Department Provider Note   ____________________________________________   First MD Initiated Contact with Patient 10/12/15 0008     (approximate)  I have reviewed the triage vital signs and the nursing notes.   HISTORY  Chief Complaint Leg Swelling    HPI [name] is a 82 y.o. female who presents to the ED from home with a chief complaint of right leg pain and swelling. Patient reports noticing swelling and pain to right lower extremity yesterday. Denies fall, injury or trauma. Progressive swelling throughout the day today and painful to ambulate. Denies associated fever, chills, chest pain, shortness of breath, abdominal pain, nausea, vomiting, diarrhea. Denies recent travel or trauma. Denies hormone use. Nothing makes her symptoms better or worse.   Past Medical History:  Diagnosis Date  . Aneurysm of anterior cerebral artery   . Blindness of left eye    secondary to cataract surgery complication  . Depression   . Hypertension   . SAH (subarachnoid hemorrhage) ([place]) 02/2006  . Tobacco abuse     There are no active problems to display for this patient.   Past Surgical History:  Procedure Laterality Date  . ABDOMINAL HYSTERECTOMY    . ANEURYSM COILING    . CATARACT EXTRACTION    . CHOLECYSTECTOMY      Prior to Admission medications   Medication Sig Start Date End Date Taking? Authorizing Provider  aspirin 81 MG tablet Take 81 mg by mouth daily.    Historical Provider, MD  citalopram (CELEXA) 20 MG tablet Take 20 mg by mouth daily.    Historical Provider, MD  ferrous sulfate 325 (65 FE) MG tablet Take 325 mg by mouth daily with breakfast.    Historical Provider, MD  HYDROcodone-acetaminophen (NORCO) 5-325 MG tablet Take 1 tablet by mouth every 6 (six) hours as needed for moderate pain. 10/12/15   Paulette Blanch, MD  lisinopril-hydrochlorothiazide (PRINZIDE,ZESTORETIC) 20-25 MG tablet Take 1 tablet by mouth daily.     Historical Provider, MD  omeprazole (PRILOSEC) 20 MG capsule Take 20 mg by mouth every morning.    Historical Provider, MD  Vitamin D, Ergocalciferol, (DRISDOL) 50000 units CAPS capsule Take 50,000 Units by mouth every Sunday.    Historical Provider, MD    Allergies Review of patient's allergies indicates no known allergies.  No family history on file.  Social History Social History  Substance Use Topics  . Smoking status: Former Smoker    Quit date: 07/31/2015  . Smokeless tobacco: Never Used  . Alcohol use Yes    Review of Systems  Constitutional: No fever/chills. Eyes: No visual changes. ENT: No sore throat. Cardiovascular: Denies chest pain. Respiratory: Denies shortness of breath. Gastrointestinal: No abdominal pain.  No nausea, no vomiting.  No diarrhea.  No constipation. Genitourinary: Negative for dysuria. Musculoskeletal: Positive for right leg pain and swelling. Negative for back pain. Skin: Negative for rash. Neurological: Negative for headaches, focal weakness or numbness.  10-point ROS otherwise negative.  ____________________________________________   PHYSICAL EXAM:  VITAL SIGNS: ED Triage Vitals  Enc Vitals Group     BP 10/11/15 2216 (!) 182/74     Pulse Rate 10/11/15 2216 71     Resp 10/11/15 2216 18     Temp 10/11/15 2216 98 F (36.7 C)     Temp Source 10/11/15 2216 Oral     SpO2 10/11/15 2216 96 %     Weight 10/11/15 2217 111 lb (50.3 kg)     Height  10/11/15 2217 5\' 2"  (1.575 m)     Head Circumference --      Peak Flow --      Pain Score 10/11/15 2218 8     Pain Loc --      Pain Edu? --      Excl. in [place]? --     Constitutional: Alert and oriented. Well appearing and in no acute distress. Eyes: Conjunctivae are normal. PERRL. EOMI. Head: Atraumatic. Nose: No congestion/rhinnorhea. Mouth/Throat: Mucous membranes are moist.  Oropharynx non-erythematous. Neck: No stridor.   Cardiovascular: Normal rate, regular rhythm. Grossly normal heart  sounds.  Good peripheral circulation. Respiratory: Normal respiratory effort.  No retractions. Lungs CTAB. Gastrointestinal: Soft and nontender. No distention. No abdominal bruits. No CVA tenderness. Musculoskeletal:  Left calf 31 cm. Right calf 34 cm. Right calf with swelling below the knee to foot. 1+ pitting edema. 2+ distal pulses. Calf is tender to palpation. No ecchymosis. Leg is symmetrically warm without evidence for ischemia. Neurologic:  Normal speech and language. No gross focal neurologic deficits are appreciated.  Skin:  Skin is warm, dry and intact. No rash noted. Psychiatric: Mood and affect are normal. Speech and behavior are normal.  ____________________________________________   LABS (all labs ordered are listed, but only abnormal results are displayed)  Labs Reviewed  CBC - Abnormal; Notable for the following:       Result Value   WBC 14.9 (*)    RBC 3.25 (*)    Hemoglobin 10.8 (*)    HCT 31.4 (*)    RDW 17.4 (*)    Platelets 618 (*)    All other components within normal limits  COMPREHENSIVE METABOLIC PANEL - Abnormal; Notable for the following:    BUN 23 (*)    Creatinine, Ser 1.08 (*)    Calcium 8.8 (*)    GFR calc non Af Amer 46 (*)    GFR calc Af Amer 53 (*)    All other components within normal limits  APTT - Abnormal; Notable for the following:    aPTT 39 (*)    All other components within normal limits  PROTIME-INR   ____________________________________________  EKG  None ____________________________________________  RADIOLOGY  Right lower extremity Doppler ultrasound interpreted per Dr. Jamesetta Geralds: No evidence of deep venous thrombosis in the right lower extremity.    Large complex Baker's cyst versus hematoma.   ____________________________________________   PROCEDURES  Procedure(s) performed: None  Procedures  Critical Care performed: No  ____________________________________________   INITIAL IMPRESSION / ASSESSMENT AND  PLAN / ED COURSE  Pertinent labs & imaging results that were available during my care of the patient were reviewed by me and considered in my medical decision making (see chart for details).  82 year old female who presents with nontraumatic right leg pain and swelling. Laboratory results remarkable for mild leukocytosis. Doppler ultrasound is pending. Will administer Norco and reassess.  Clinical Course  Comment By Time  Updated patient and family members of ultrasound results. Reports pain is improved. Will ambulate patient with walker and reassess. Paulette Blanch, MD 09/04 [phone]  Patient did very well ambulating with walker. Ace wrap applied to right knee. Strict return precautions given. All verbalize understanding and agree with plan of care. Paulette Blanch, MD 09/04 0222     ____________________________________________   FINAL CLINICAL IMPRESSION(S) / ED DIAGNOSES  Final diagnoses:  Right leg swelling  Baker's cyst of knee, right  Hematoma      NEW MEDICATIONS STARTED DURING THIS VISIT:  Discharge Medication List as of 10/12/2015  2:13 AM    START taking these medications   Details  HYDROcodone-acetaminophen (NORCO) 5-325 MG tablet Take 1 tablet by mouth every 6 (six) hours as needed for moderate pain., Starting Mon 10/12/2015, Print         Note:  This document was prepared using Dragon voice recognition software and may include unintentional dictation errors.    Paulette Blanch, MD 10/12/15 [id number]

## 2015-12-03 DIAGNOSIS — F0150 Vascular dementia without behavioral disturbance: Secondary | ICD-10-CM | POA: Diagnosis present

## 2015-12-07 ENCOUNTER — Other Ambulatory Visit: Payer: Self-pay | Admitting: Internal Medicine

## 2015-12-07 DIAGNOSIS — Z1231 Encounter for screening mammogram for malignant neoplasm of breast: Secondary | ICD-10-CM

## 2016-01-12 ENCOUNTER — Ambulatory Visit
Admission: RE | Admit: 2016-01-12 | Discharge: 2016-01-12 | Disposition: A | Discharge status: Home / Self Care | Payer: Medicare Other | Source: Ambulatory Visit | Attending: Internal Medicine | Admitting: Internal Medicine

## 2016-01-12 DIAGNOSIS — Z1231 Encounter for screening mammogram for malignant neoplasm of breast: Secondary | ICD-10-CM

## 2016-07-11 ENCOUNTER — Emergency Department: Payer: Medicare Other

## 2016-07-11 ENCOUNTER — Inpatient Hospital Stay
Admission: EM | Admit: 2016-07-11 | Discharge: 2016-07-13 | DRG: 190 | Disposition: A | Discharge status: Home Health | Payer: Medicare Other | Attending: Internal Medicine | Admitting: Internal Medicine

## 2016-07-11 ENCOUNTER — Encounter: Payer: Self-pay | Admitting: *Deleted

## 2016-07-11 DIAGNOSIS — J069 Acute upper respiratory infection, unspecified: Secondary | ICD-10-CM

## 2016-07-11 DIAGNOSIS — T380X5A Adverse effect of glucocorticoids and synthetic analogues, initial encounter: Secondary | ICD-10-CM | POA: Diagnosis present

## 2016-07-11 DIAGNOSIS — E876 Hypokalemia: Secondary | ICD-10-CM | POA: Diagnosis present

## 2016-07-11 DIAGNOSIS — I10 Essential (primary) hypertension: Secondary | ICD-10-CM | POA: Diagnosis present

## 2016-07-11 DIAGNOSIS — R059 Cough, unspecified: Secondary | ICD-10-CM

## 2016-07-11 DIAGNOSIS — R0902 Hypoxemia: Secondary | ICD-10-CM

## 2016-07-11 DIAGNOSIS — J449 Chronic obstructive pulmonary disease, unspecified: Secondary | ICD-10-CM | POA: Diagnosis present

## 2016-07-11 DIAGNOSIS — Z87891 Personal history of nicotine dependence: Secondary | ICD-10-CM

## 2016-07-11 DIAGNOSIS — M25472 Effusion, left ankle: Secondary | ICD-10-CM

## 2016-07-11 DIAGNOSIS — H5462 Unqualified visual loss, left eye, normal vision right eye: Secondary | ICD-10-CM | POA: Diagnosis present

## 2016-07-11 DIAGNOSIS — Z79899 Other long term (current) drug therapy: Secondary | ICD-10-CM

## 2016-07-11 DIAGNOSIS — Z66 Do not resuscitate: Secondary | ICD-10-CM | POA: Diagnosis present

## 2016-07-11 DIAGNOSIS — M25471 Effusion, right ankle: Secondary | ICD-10-CM

## 2016-07-11 DIAGNOSIS — J9621 Acute and chronic respiratory failure with hypoxia: Secondary | ICD-10-CM | POA: Diagnosis present

## 2016-07-11 DIAGNOSIS — R05 Cough: Secondary | ICD-10-CM | POA: Diagnosis present

## 2016-07-11 DIAGNOSIS — Z7982 Long term (current) use of aspirin: Secondary | ICD-10-CM | POA: Diagnosis not present

## 2016-07-11 DIAGNOSIS — Z803 Family history of malignant neoplasm of breast: Secondary | ICD-10-CM | POA: Diagnosis not present

## 2016-07-11 DIAGNOSIS — J441 Chronic obstructive pulmonary disease with (acute) exacerbation: Principal | ICD-10-CM | POA: Diagnosis present

## 2016-07-11 DIAGNOSIS — J440 Chronic obstructive pulmonary disease with acute lower respiratory infection: Secondary | ICD-10-CM | POA: Diagnosis present

## 2016-07-11 DIAGNOSIS — J209 Acute bronchitis, unspecified: Secondary | ICD-10-CM | POA: Diagnosis present

## 2016-07-11 DIAGNOSIS — D72829 Elevated white blood cell count, unspecified: Secondary | ICD-10-CM | POA: Diagnosis present

## 2016-07-11 LAB — BASIC METABOLIC PANEL
ANION GAP: 8 (ref 5–15)
BUN: 16 mg/dL (ref 6–20)
CHLORIDE: 104 mmol/L (ref 101–111)
CO2: 26 mmol/L (ref 22–32)
Calcium: 8.8 mg/dL — ABNORMAL LOW (ref 8.9–10.3)
Creatinine, Ser: 0.71 mg/dL (ref 0.44–1.00)
GFR calc Af Amer: >60 mL/min (ref >60)
GLUCOSE: 102 mg/dL — AB (ref 65–99)
Potassium: 4 mmol/L (ref 3.5–5.1)
Sodium: 138 mmol/L (ref 135–145)

## 2016-07-11 LAB — CBC
HCT: 35.6 % (ref 35.0–47.0)
HEMOGLOBIN: 11.4 g/dL — AB (ref 12.0–16.0)
MCH: 28.8 pg (ref 26.0–34.0)
MCHC: 31.9 g/dL — AB (ref 32.0–36.0)
MCV: 90.3 fL (ref 80.0–100.0)
Platelets: 337 10*3/uL (ref 150–440)
RBC: 3.94 MIL/uL (ref 3.80–5.20)
RDW: 20.8 % — AB (ref 11.5–14.5)
WBC: 18.1 10*3/uL — ABNORMAL HIGH (ref 3.6–11.0)

## 2016-07-11 LAB — BRAIN NATRIURETIC PEPTIDE: B Natriuretic Peptide: 258 pg/mL — ABNORMAL HIGH (ref 0.0–100.0)

## 2016-07-11 MED ORDER — DEXTROSE 5 % IV SOLN
500.0000 mg | Freq: Once | INTRAVENOUS | Status: AC
  Administered 2016-07-11: 500 mg via INTRAVENOUS
  Filled 2016-07-11: qty 500

## 2016-07-11 MED ORDER — DEXTROSE 5 % IV SOLN
1.0000 g | Freq: Once | INTRAVENOUS | Status: AC
  Administered 2016-07-11: 1 g via INTRAVENOUS
  Filled 2016-07-11: qty 10

## 2016-07-11 MED ORDER — MOMETASONE FURO-FORMOTEROL FUM 100-5 MCG/ACT IN AERO
2.0000 | INHALATION_SPRAY | Freq: Two times a day (BID) | RESPIRATORY_TRACT | Status: DC
  Administered 2016-07-11 – 2016-07-12 (×2): 2 via RESPIRATORY_TRACT
  Filled 2016-07-11: qty 8.8

## 2016-07-11 MED ORDER — LEVOFLOXACIN 500 MG PO TABS: 500.0000 mg | ORAL_TABLET | Freq: Every day | ORAL | Status: DC

## 2016-07-11 MED ORDER — IPRATROPIUM-ALBUTEROL 0.5-2.5 (3) MG/3ML IN SOLN
3.0000 mL | Freq: Once | RESPIRATORY_TRACT | Status: AC
  Administered 2016-07-11: 3 mL via RESPIRATORY_TRACT
  Filled 2016-07-11: qty 3

## 2016-07-11 MED ORDER — FERROUS SULFATE 325 (65 FE) MG PO TABS: 325.0000 mg | ORAL_TABLET | ORAL | Status: DC

## 2016-07-11 MED ORDER — ALPRAZOLAM 0.25 MG PO TABS
0.2500 mg | ORAL_TABLET | Freq: Three times a day (TID) | ORAL | Status: DC | PRN
  Administered 2016-07-12 – 2016-07-13 (×2): 0.25 mg via ORAL
  Filled 2016-07-11 (×2): qty 1

## 2016-07-11 MED ORDER — HEPARIN SODIUM (PORCINE) 5000 UNIT/ML IJ SOLN
5000.0000 [IU] | Freq: Three times a day (TID) | INTRAMUSCULAR | Status: DC
  Administered 2016-07-11 – 2016-07-13 (×6): 5000 [IU] via SUBCUTANEOUS
  Filled 2016-07-11 (×6): qty 1

## 2016-07-11 MED ORDER — LISINOPRIL 20 MG PO TABS
40.0000 mg | ORAL_TABLET | Freq: Every day | ORAL | Status: DC
  Administered 2016-07-11 – 2016-07-13 (×3): 40 mg via ORAL
  Filled 2016-07-11 (×3): qty 2

## 2016-07-11 MED ORDER — IPRATROPIUM-ALBUTEROL 0.5-2.5 (3) MG/3ML IN SOLN
3.0000 mL | RESPIRATORY_TRACT | Status: DC
  Administered 2016-07-11 – 2016-07-13 (×11): 3 mL via RESPIRATORY_TRACT
  Filled 2016-07-11 (×11): qty 3

## 2016-07-11 MED ORDER — BUDESONIDE 0.25 MG/2ML IN SUSP
0.2500 mg | Freq: Two times a day (BID) | RESPIRATORY_TRACT | Status: DC
Start: 2016-07-11 — End: 2016-07-13
  Administered 2016-07-11 – 2016-07-13 (×4): 0.25 mg via RESPIRATORY_TRACT
  Filled 2016-07-11 (×5): qty 2

## 2016-07-11 MED ORDER — CITALOPRAM HYDROBROMIDE 20 MG PO TABS
20.0000 mg | ORAL_TABLET | Freq: Every day | ORAL | Status: DC
  Administered 2016-07-11 – 2016-07-13 (×3): 20 mg via ORAL
  Filled 2016-07-11 (×3): qty 1

## 2016-07-11 MED ORDER — LEVOFLOXACIN 500 MG PO TABS
250.0000 mg | ORAL_TABLET | Freq: Every day | ORAL | Status: DC
  Administered 2016-07-12 – 2016-07-13 (×2): 250 mg via ORAL
  Filled 2016-07-11 (×2): qty 1

## 2016-07-11 MED ORDER — METHYLPREDNISOLONE SODIUM SUCC 125 MG IJ SOLR
60.0000 mg | Freq: Four times a day (QID) | INTRAMUSCULAR | Status: DC
  Administered 2016-07-11 – 2016-07-13 (×7): 60 mg via INTRAVENOUS
  Filled 2016-07-11 (×7): qty 2

## 2016-07-11 MED ORDER — LEVOFLOXACIN 500 MG PO TABS
500.0000 mg | ORAL_TABLET | Freq: Once | ORAL | Status: AC
  Administered 2016-07-11: 500 mg via ORAL
  Filled 2016-07-11: qty 1

## 2016-07-11 MED ORDER — CEFTRIAXONE SODIUM IN DEXTROSE 20 MG/ML IV SOLN
1.0000 g | Freq: Once | INTRAVENOUS | Status: DC
  Filled 2016-07-11 (×2): qty 50

## 2016-07-11 MED ORDER — METHYLPREDNISOLONE SODIUM SUCC 125 MG IJ SOLR
125.0000 mg | Freq: Once | INTRAMUSCULAR | Status: AC
Start: 2016-07-11 — End: 2016-07-11
  Administered 2016-07-11: 125 mg via INTRAVENOUS
  Filled 2016-07-11: qty 2

## 2016-07-11 MED ORDER — DOCUSATE SODIUM 100 MG PO CAPS: 100.0000 mg | ORAL_CAPSULE | Freq: Two times a day (BID) | ORAL | Status: DC | PRN

## 2016-07-11 MED ORDER — PANTOPRAZOLE SODIUM 40 MG PO TBEC
40.0000 mg | DELAYED_RELEASE_TABLET | Freq: Every day | ORAL | Status: DC
  Administered 2016-07-11 – 2016-07-13 (×3): 40 mg via ORAL
  Filled 2016-07-11 (×3): qty 1

## 2016-07-11 MED ORDER — TIOTROPIUM BROMIDE MONOHYDRATE 18 MCG IN CAPS
18.0000 ug | ORAL_CAPSULE | Freq: Every day | RESPIRATORY_TRACT | Status: DC
  Administered 2016-07-11 – 2016-07-12 (×2): 18 ug via RESPIRATORY_TRACT
  Filled 2016-07-11: qty 5

## 2016-07-11 MED ORDER — ASPIRIN EC 81 MG PO TBEC
81.0000 mg | DELAYED_RELEASE_TABLET | Freq: Every day | ORAL | Status: DC
  Administered 2016-07-11 – 2016-07-13 (×3): 81 mg via ORAL
  Filled 2016-07-11 (×3): qty 1

## 2016-07-11 NOTE — Progress Notes (Signed)
Left leg more swollen than right.  MD notified.  Order obtained for ultrasound.

## 2016-07-11 NOTE — Progress Notes (Signed)
PHARMACY NOTE -  ANTIBIOTIC RENAL DOSE ADJUSTMENT   Request received for Pharmacy to assist with antibiotic renal dose adjustment.  Patient has been initiated on Levaquin for COPD exacerbation. SCr 0.71, estimated CrCl 38.3 ml/min Will adjust Levaquin dosing to 500 mg oral once then 250 mg oral daily.  Will sign off at this time.  Please reconsult if a change in clinical status warrants re-evaluation of dosage.  Ulice Dash, PharmD Clinical Pharmacist

## 2016-07-11 NOTE — Progress Notes (Signed)
Family Meeting Note  Advance Directive:yes  Today a meeting took place with the Patient and daughter.  The following clinical team members were present during this meeting:MD  The following were discussed:Patient's diagnosis: COPD, respi failure , Patient's progosis: Unable to determine and Goals for treatment: DNR  Additional follow-up to be provided:PMD  Time spent during discussion:20 minutes  [name], Rosalio Macadamia, MD

## 2016-07-11 NOTE — ED Notes (Signed)
Message sent to pharmacy regarding missing Rocephin. States they will send shortly.

## 2016-07-11 NOTE — H&P (Signed)
[facility] NAME: [name]    MR#:  [id number]  DATE OF BIRTH:  1928-03-19  DATE OF ADMISSION:  07/11/2016  PRIMARY CARE PHYSICIAN: Tracie Harrier, MD   REQUESTING/REFERRING PHYSICIAN: Paduchowski  CHIEF COMPLAINT:   Chief Complaint  Patient presents with  . Cough    HISTORY OF PRESENT ILLNESS: [name]  is a 83 y.o. female with a known history of Aneurysm in pain, depression, hypertension, subarachnoid hemorrhage, smoking in the past- for last 1 week having some shortness of breath and cough with white colored sputum production and chills. And she is also feeling excessively tired and short of breath with activities for last 1 week. Concerned with this she went to her primary care doctor's office at [facility], they found her to be hypoxic and sent her to the emergency room. In ER she is noted to have oxygen saturation 85% and wheezing so she is given to hospitalist team for further management.  PAST MEDICAL HISTORY:   Past Medical History:  Diagnosis Date  . Aneurysm of anterior cerebral artery   . Blindness of left eye    secondary to cataract surgery complication  . Depression   . Hypertension   . SAH (subarachnoid hemorrhage) ([place]) 02/2006  . Tobacco abuse     PAST SURGICAL HISTORY: Past Surgical History:  Procedure Laterality Date  . ABDOMINAL HYSTERECTOMY    . ANEURYSM COILING    . CATARACT EXTRACTION    . CHOLECYSTECTOMY      SOCIAL HISTORY:  Social History  Substance Use Topics  . Smoking status: Former Smoker    Quit date: 07/31/2015  . Smokeless tobacco: Never Used  . Alcohol use Yes    FAMILY HISTORY:  Family History  Problem Relation Age of Onset  . Breast cancer Daughter 69  . Breast cancer Sister     DRUG ALLERGIES: No Known Allergies  REVIEW OF SYSTEMS:   CONSTITUTIONAL: No fever,Positive for fatigue or weakness.  EYES: No blurred or double vision.  EARS, NOSE, AND THROAT: No  tinnitus or ear pain.  RESPIRATORY: Positive for cough, shortness of breath, wheezing or hemoptysis.  CARDIOVASCULAR: No chest pain, orthopnea, edema.  GASTROINTESTINAL: No nausea, vomiting, diarrhea or abdominal pain.  GENITOURINARY: No dysuria, hematuria.  ENDOCRINE: No polyuria, nocturia,  HEMATOLOGY: No anemia, easy bruising or bleeding SKIN: No rash or lesion. MUSCULOSKELETAL: No joint pain or arthritis.   NEUROLOGIC: No tingling, numbness, weakness.  PSYCHIATRY: No anxiety or depression.   MEDICATIONS AT HOME:  Prior to Admission medications   Medication Sig Start Date End Date Taking? Authorizing Provider  aspirin 81 MG tablet Take 81 mg by mouth daily.   Yes [provider]  citalopram (CELEXA) 20 MG tablet Take 20 mg by mouth daily.   Yes [provider]  ferrous sulfate 325 (65 FE) MG tablet Take 325 mg by mouth once a week. Take 1 tablet on Sundays.   Yes [provider]  lisinopril (PRINIVIL,ZESTRIL) 40 MG tablet Take 40 mg by mouth daily. 06/30/16  Yes [provider]  omeprazole (PRILOSEC) 20 MG capsule Take 20 mg by mouth every morning.   Yes [provider]  Vitamin D, Ergocalciferol, (DRISDOL) 50000 units CAPS capsule Take 50,000 Units by mouth every Sunday.   Yes [provider]  ALPRAZolam (XANAX) 0.25 MG tablet Take 0.25 mg by mouth daily as needed.    [provider]      PHYSICAL EXAMINATION:  VITAL SIGNS: Blood pressure (!) 178/63, pulse 79, temperature 98.7 F (37.1 C), temperature source Oral, resp. rate 18, height 5\' 2"  (1.575 m), weight 47.2 kg (104 lb), SpO2 94 %.  GENERAL:  83 y.o.-year-old patient lying in the bed with no acute distress.  EYES: Pupils equal, round, reactive to light and accommodation. No scleral icterus. Extraocular muscles intact.  HEENT: Head atraumatic, normocephalic. Oropharynx and nasopharynx clear.  NECK:  Supple, no jugular venous distention. No thyroid enlargement, no  tenderness.  LUNGS: Normal breath sounds bilaterally, Some wheezing, no crepitation. No use of accessory muscles of respiration.  CARDIOVASCULAR: S1, S2 normal. No murmurs, rubs, or gallops.  ABDOMEN: Soft, nontender, nondistended. Bowel sounds present. No organomegaly or mass.  EXTREMITIES: No pedal edema, cyanosis, or clubbing.  NEUROLOGIC: Cranial nerves II through XII are intact. Muscle strength 5/5 in all extremities. Sensation intact. Gait not checked.  PSYCHIATRIC: The patient is alert and oriented x 3.  SKIN: No obvious rash, lesion, or ulcer.   LABORATORY PANEL:   CBC  Recent Labs Lab 07/11/16 1339  WBC 18.1*  HGB 11.4*  HCT 35.6  PLT 337  MCV 90.3  MCH 28.8  MCHC 31.9*  RDW 20.8*   ------------------------------------------------------------------------------------------------------------------  Chemistries   Recent Labs Lab 07/11/16 1339  NA 138  K 4.0  CL 104  CO2 26  GLUCOSE 102*  BUN 16  CREATININE 0.71  CALCIUM 8.8*   ------------------------------------------------------------------------------------------------------------------ estimated creatinine clearance is 38.3 mL/min (by C-G formula based on SCr of 0.71 mg/dL). ------------------------------------------------------------------------------------------------------------------ No results for input(s): TSH, T4TOTAL, T3FREE, THYROIDAB in the last 72 hours.  Invalid input(s): FREET3   Coagulation profile No results for input(s): INR, PROTIME in the last 168 hours. ------------------------------------------------------------------------------------------------------------------- No results for input(s): DDIMER in the last 72 hours. -------------------------------------------------------------------------------------------------------------------  Cardiac Enzymes No results for input(s): CKMB, TROPONINI, MYOGLOBIN in the last 168 hours.  Invalid input(s):  CK ------------------------------------------------------------------------------------------------------------------ Invalid input(s): POCBNP  ---------------------------------------------------------------------------------------------------------------  Urinalysis No results found for: COLORURINE, APPEARANCEUR, LABSPEC, PHURINE, GLUCOSEU, HGBUR, BILIRUBINUR, KETONESUR, PROTEINUR, UROBILINOGEN, NITRITE, LEUKOCYTESUR   RADIOLOGY: Dg Chest 2 View  Result Date: 07/11/2016 CLINICAL DATA:  Productive cough for 1 week. EXAM: CHEST  2 VIEW COMPARISON:  None. FINDINGS: Cardiomediastinal silhouette is mildly enlarged. Mediastinal contours appear intact. Tortuosity and calcific atherosclerotic disease of the aorta noted. There is no evidence of focal airspace consolidation, pleural effusion or pneumothorax. The lungs are hyperinflated with diffuse coarsening of the interstitium. Chronic appearing anterior compression deformity of L1 vertebral body with approximately 40% height loss. Soft tissues are grossly normal. IMPRESSION: Hyperinflation of the lungs with chronic interstitial lung changes and bronchiectasis. No evidence of focal airspace consolidation. Calcific atherosclerotic disease of the aorta. Age-indeterminate, likely chronic compression deformity of L1 vertebral body. Please correlate clinically. Electronically Signed   By: Fidela Salisbury M.D.   On: 07/11/2016 14:05    EKG: Orders placed or performed during the hospital encounter of 07/11/16  . ED EKG within 10 minutes  . ED EKG within 10 minutes    IMPRESSION AND PLAN:  * Acute hypoxic respiratory failure and COPD exacerbation   Acute bronchitis    IV steroid and inhaled nebulizer, Spiriva.   Try to get sputum culture.    Continue supplemental oxygen now and try to taper.   Levaquin oral.  * Hypertension   Continue lisinopril.  * Elevated white blood cell count   Due to above.  All the records are reviewed and case  discussed with ED provider. Management plans discussed  with the patient, family and they are in agreement.  CODE STATUS: DO NOT RESUSCITATE. Code Status History    This patient does not have a recorded code status. Please follow your organizational policy for patients in this situation.     Patient's daughter was present in the room during my visit.  TOTAL TIME TAKING CARE OF THIS PATIENT: 50 minutes.    Vaughan Basta M.D on 07/11/2016   Between 7am to 6pm - Pager - [phone]  After 6pm go to [email or web address] - password [email or web address] [facility]  Office  [phone]  CC: Primary care physician; Tracie Harrier, MD   Note: This dictation was prepared with Dragon dictation along with smaller phrase technology. Any transcriptional errors that result from this process are unintentional.

## 2016-07-11 NOTE — ED Provider Notes (Signed)
[facility] Emergency Department Provider Note  Time seen: 2:15 PM  I have reviewed the triage vital signs and the nursing notes.   HISTORY  Chief Complaint Cough    HPI [name] is a 83 y.o. female with a past medical history of depression, hypertension, presents to the emergency department with cough and difficulty breathing. According to the patient for the past one week she has had a frequent cough productive of white sputum. She states her shortness of breath has worsened so she went to her doctor today for evaluation. Patient was found to be hypoxic to 85% on room air with no home O2 baseline requirements of the patient was transferred to the emergency department for further evaluation. Patient denies any chest pain, abdominal pain, nausea vomiting or diarrhea. She states chills but denies fever. Patient denies leg pain or swelling. Describes her shortness of breath as mild at rest, moderate with any type of exertion.  Past Medical History:  Diagnosis Date  . Aneurysm of anterior cerebral artery   . Blindness of left eye    secondary to cataract surgery complication  . Depression   . Hypertension   . SAH (subarachnoid hemorrhage) ([place]) 02/2006  . Tobacco abuse     There are no active problems to display for this patient.   Past Surgical History:  Procedure Laterality Date  . ABDOMINAL HYSTERECTOMY    . ANEURYSM COILING    . CATARACT EXTRACTION    . CHOLECYSTECTOMY      Prior to Admission medications   Medication Sig Start Date End Date Taking? Authorizing Provider  aspirin 81 MG tablet Take 81 mg by mouth daily.    [provider]  citalopram (CELEXA) 20 MG tablet Take 20 mg by mouth daily.    [provider]  ferrous sulfate 325 (65 FE) MG tablet Take 325 mg by mouth daily with breakfast.    [provider]  HYDROcodone-acetaminophen (NORCO) 5-325 MG tablet Take 1 tablet by mouth every 6 (six) hours as  needed for moderate pain. 10/12/15   Paulette Blanch, MD  lisinopril-hydrochlorothiazide (PRINZIDE,ZESTORETIC) 20-25 MG tablet Take 1 tablet by mouth daily.    [provider]  omeprazole (PRILOSEC) 20 MG capsule Take 20 mg by mouth every morning.    [provider]  Vitamin D, Ergocalciferol, (DRISDOL) 50000 units CAPS capsule Take 50,000 Units by mouth every Sunday.    [provider]    No Known Allergies  Family History  Problem Relation Age of Onset  . Breast cancer Daughter 14    Social History Social History  Substance Use Topics  . Smoking status: Former Smoker    Quit date: 07/31/2015  . Smokeless tobacco: Never Used  . Alcohol use Yes    Review of Systems Constitutional: Negative for fever. ENT: Negative for congestion Cardiovascular: Negative for chest pain. Respiratory: Positive for shortness of breath. Positive for cough. Gastrointestinal: Negative for abdominal pain, vomiting and diarrhea. Genitourinary: Negative for dysuria. Musculoskeletal: Negative for back pain. Skin: Negative for rash. Neurological: Negative for headache All other ROS negative  ____________________________________________   PHYSICAL EXAM:  VITAL SIGNS: ED Triage Vitals [07/11/16 1335]  Enc Vitals Group     BP (!) 178/63     Pulse Rate 79     Resp 18     Temp 98.7 F (37.1 C)     Temp Source Oral     SpO2 94 %     Weight 104  lb (47.2 kg)     Height 5\' 2"  (1.575 m)     Head Circumference      Peak Flow      Pain Score      Pain Loc      Pain Edu?      Excl. in [place]?    Constitutional: Alert and oriented. Well appearing and in no distress. Eyes: Normal exam ENT   Head: Normocephalic and atraumatic   Mouth/Throat: Mucous membranes are moist. Cardiovascular: Normal rate, regular rhythm. No murmur Respiratory: Normal respiratory rate however the patient is moderate diffuse rhonchi in all lung fields. No obvious wheeze. Gastrointestinal: Soft and  nontender. No distention.   Musculoskeletal: Nontender with normal range of motion in all extremities. No lower extremity tenderness or edema. Neurologic:  Normal speech and language. No gross focal neurologic deficits Skin:  Skin is warm, dry and intact.  Psychiatric: Mood and affect are normal.   ____________________________________________    EKG  EKG reviewed and interpreted by myself shows normal sinus rhythm at 77 bpm, narrow QRS, normal axis, normal intervals, no concerning ST changes.  ____________________________________________    RADIOLOGY  Chest x-ray is read as negative.  ____________________________________________   INITIAL IMPRESSION / ASSESSMENT AND PLAN / ED COURSE  Pertinent labs & imaging results that were available during my care of the patient were reviewed by me and considered in my medical decision making (see chart for details).  The patient presents to the emergency department with cough, chest congestion and hypoxia. On exam the patient has diffuse rhonchi in all lung fields. Daughter states the patient has mild COPD at baseline. No wheezes on exam. At this time it is not entirely clear if this is COPD versus CHF versus infectious. The CT official read as negative, however given the patient's productive cough with diffuse rhonchi, leukocytosis, and hypoxia we will cover with antibiotics, breathing treatments and Solu-Medrol. Patient's lower extremities are normal without edema or tenderness.  ____________________________________________   FINAL CLINICAL IMPRESSION(S) / ED DIAGNOSES  COPD exacerbation Hypoxia Dyspnea    Harvest Dark, MD 07/11/16 1421

## 2016-07-11 NOTE — ED Triage Notes (Signed)
Pt arrives from [facility], pt has had productive cough for 1 week with SOB, [place] reports o2 sat 85% on RA, pt states hx of COPD, no home 02 use, pt placed on 2L [place]

## 2016-07-12 ENCOUNTER — Inpatient Hospital Stay: Payer: Medicare Other

## 2016-07-12 LAB — CBC
HEMATOCRIT: 34.5 % — AB (ref 35.0–47.0)
Hemoglobin: 10.9 g/dL — ABNORMAL LOW (ref 12.0–16.0)
MCH: 29 pg (ref 26.0–34.0)
MCHC: 31.6 g/dL — ABNORMAL LOW (ref 32.0–36.0)
MCV: 91.7 fL (ref 80.0–100.0)
PLATELETS: 341 10*3/uL (ref 150–440)
RBC: 3.76 MIL/uL — ABNORMAL LOW (ref 3.80–5.20)
RDW: 20.3 % — ABNORMAL HIGH (ref 11.5–14.5)
WBC: 21.2 10*3/uL — AB (ref 3.6–11.0)

## 2016-07-12 LAB — BASIC METABOLIC PANEL
Anion gap: 10 (ref 5–15)
BUN: 20 mg/dL (ref 6–20)
CALCIUM: 9 mg/dL (ref 8.9–10.3)
CO2: 26 mmol/L (ref 22–32)
Chloride: 107 mmol/L (ref 101–111)
Creatinine, Ser: 0.92 mg/dL (ref 0.44–1.00)
GFR calc Af Amer: >60 mL/min (ref >60)
GFR, EST NON AFRICAN AMERICAN: 55 mL/min — AB (ref >60)
Glucose, Bld: 188 mg/dL — ABNORMAL HIGH (ref 65–99)
POTASSIUM: 3.3 mmol/L — AB (ref 3.5–5.1)
SODIUM: 143 mmol/L (ref 135–145)

## 2016-07-12 MED ORDER — POTASSIUM CHLORIDE 20 MEQ PO PACK
40.0000 meq | PACK | Freq: Once | ORAL | Status: AC
  Administered 2016-07-12: 40 meq via ORAL
  Filled 2016-07-12: qty 2

## 2016-07-12 NOTE — Care Management Note (Signed)
Case Management Note  Patient Details  Name: SULEYMA WAFER MRN: [id number] Date of Birth: 11-Aug-1928  Subjective/Objective:  Me with patient and her granddaughter at bedside. Patient lives at home with hr daughetr. Prior to admission, she was independent and required no assistance with ADL's. She used no DME. She does say that her dgt is disabled so they have 3 walkers in the home that she can use if needed. Patient is verbal and very interactive during conversation. She is not on home O2 and does not have any home health services. Granddaughter does request a PT consult to assess her fall risk. Will watch for home O2 needs, now on 3 L. And monitor for PT assessment.                    Action/Plan: PT assessment pending.   Expected Discharge Date:  07/13/16               Expected Discharge Plan:     In-House Referral:     Discharge planning Services  CM Consult  Post Acute Care Choice:    Choice offered to:     DME Arranged:    DME Agency:     HH Arranged:    HH Agency:     Status of Service:  In process, will continue to follow  If discussed at Long Length of Stay Meetings, dates discussed:    Additional Comments:  Jolly Mango, RN 07/12/2016, 2:41 PM

## 2016-07-12 NOTE — Progress Notes (Signed)
Pt remains alert and oriented. Pt is still remaining on 3L of oxygen. Lungs with expiratory wheezing bilaterally. Up to bedside commode to void.

## 2016-07-12 NOTE — Progress Notes (Signed)
[place] [facility] [place] NAME: [name]    MR#:  [id number]  DATE OF BIRTH:  1929-01-29  SUBJECTIVE:  CHIEF COMPLAINT:  Shortness of breath is better  REVIEW OF SYSTEMS:  CONSTITUTIONAL: No fever, fatigue or weakness.  EYES: No blurred or double vision.  EARS, NOSE, AND THROAT: No tinnitus or ear pain.  RESPIRATORY: No cough, Reports exertional shortness of breath, wheezing, denies hemoptysis.  CARDIOVASCULAR: No chest pain, orthopnea, edema.  GASTROINTESTINAL: No nausea, vomiting, diarrhea or abdominal pain.  GENITOURINARY: No dysuria, hematuria.  ENDOCRINE: No polyuria, nocturia,  HEMATOLOGY: No anemia, easy bruising or bleeding SKIN: No rash or lesion. MUSCULOSKELETAL: No joint pain or arthritis.   NEUROLOGIC: No tingling, numbness, weakness.  PSYCHIATRY: No anxiety or depression.   DRUG ALLERGIES:  No Known Allergies  VITALS:  Blood pressure (!) 131/56, pulse 88, temperature 97.8 F (36.6 C), temperature source Oral, resp. rate (!) 22, height 5\' 2"  (1.575 m), weight 47.2 kg (104 lb), SpO2 94 %.  PHYSICAL EXAMINATION:  GENERAL:  83 y.o.-year-old patient lying in the bed with no acute distress.  EYES: Pupils equal, round, reactive to light and accommodation. No scleral icterus. Extraocular muscles intact.  HEENT: Head atraumatic, normocephalic. Oropharynx and nasopharynx clear.  NECK:  Supple, no jugular venous distention. No thyroid enlargement, no tenderness.  LUNGS:  coArse bronchial breath sounds bilaterally, minimal diffuse  wheezing, no  rales,rhonchi or crepitation. No use of accessory muscles of respiration.  CARDIOVASCULAR: S1, S2 normal. No murmurs, rubs, or gallops.  ABDOMEN: Soft, nontender, nondistended. Bowel sounds present. No organomegaly or mass.  EXTREMITIES: No pedal edema, cyanosis, or clubbing.  NEUROLOGIC: Cranial nerves II through XII are intact. Muscle strength 5/5 in all extremities. Sensation  intact. Gait not checked.  PSYCHIATRIC: The patient is alert and oriented x 3.  SKIN: No obvious rash, lesion, or ulcer.    LABORATORY PANEL:   CBC  Recent Labs Lab 07/12/16 0424  WBC 21.2*  HGB 10.9*  HCT 34.5*  PLT 341   ------------------------------------------------------------------------------------------------------------------  Chemistries   Recent Labs Lab 07/12/16 0424  NA 143  K 3.3*  CL 107  CO2 26  GLUCOSE 188*  BUN 20  CREATININE 0.92  CALCIUM 9.0   ------------------------------------------------------------------------------------------------------------------  Cardiac Enzymes No results for input(s): TROPONINI in the last 168 hours. ------------------------------------------------------------------------------------------------------------------  RADIOLOGY:  Dg Chest 2 View  Result Date: 07/11/2016 CLINICAL DATA:  Productive cough for 1 week. EXAM: CHEST  2 VIEW COMPARISON:  None. FINDINGS: Cardiomediastinal silhouette is mildly enlarged. Mediastinal contours appear intact. Tortuosity and calcific atherosclerotic disease of the aorta noted. There is no evidence of focal airspace consolidation, pleural effusion or pneumothorax. The lungs are hyperinflated with diffuse coarsening of the interstitium. Chronic appearing anterior compression deformity of L1 vertebral body with approximately 40% height loss. Soft tissues are grossly normal. IMPRESSION: Hyperinflation of the lungs with chronic interstitial lung changes and bronchiectasis. No evidence of focal airspace consolidation. Calcific atherosclerotic disease of the aorta. Age-indeterminate, likely chronic compression deformity of L1 vertebral body. Please correlate clinically. Electronically Signed   By: Fidela Salisbury M.D.   On: 07/11/2016 14:05   US Venous Img Lower Bilateral  Result Date: 07/12/2016 CLINICAL DATA:  Bilateral lower extremity edema EXAM: BILATERAL LOWER EXTREMITY VENOUS DUPLEX  ULTRASOUND TECHNIQUE: Gray-scale sonography with graded compression, as well as color Doppler and duplex ultrasound were performed to evaluate the lower extremity deep venous systems from the level of the common femoral vein  and including the common femoral, femoral, profunda femoral, popliteal and calf veins including the posterior tibial, peroneal and gastrocnemius veins when visible. The superficial great saphenous vein was also interrogated. Spectral Doppler was utilized to evaluate flow at rest and with distal augmentation maneuvers in the common femoral, femoral and popliteal veins. COMPARISON:  Right lower extremity venous duplex ultrasound October 11, 2015 FINDINGS: RIGHT LOWER EXTREMITY Common Femoral Vein: No evidence of thrombus. Normal compressibility, respiratory phasicity and response to augmentation. Saphenofemoral Junction: No evidence of thrombus. Normal compressibility and flow on color Doppler imaging. Profunda Femoral Vein: No evidence of thrombus. Normal compressibility and flow on color Doppler imaging. Femoral Vein: No evidence of thrombus. Normal compressibility, respiratory phasicity and response to augmentation. Popliteal Vein: No evidence of thrombus. Normal compressibility, respiratory phasicity and response to augmentation. Calf Veins: No evidence of thrombus. Normal compressibility and flow on color Doppler imaging. Note that the right peroneal vein not well seen due to surrounding edema. Superficial Great Saphenous Vein: No evidence of thrombus. Normal compressibility and flow on color Doppler imaging. Venous Reflux:  None. Other Findings: The previously noted presumed hematoma in the right popliteal fossa has resolved since prior study. LEFT LOWER EXTREMITY Common Femoral Vein: No evidence of thrombus. Normal compressibility, respiratory phasicity and response to augmentation. Saphenofemoral Junction: No evidence of thrombus. Normal compressibility and flow on color Doppler imaging.  Profunda Femoral Vein: No evidence of thrombus. Normal compressibility and flow on color Doppler imaging. Femoral Vein: No evidence of thrombus. Normal compressibility, respiratory phasicity and response to augmentation. Popliteal Vein: No evidence of thrombus. Normal compressibility, respiratory phasicity and response to augmentation. Calf Veins: No evidence of thrombus. Normal compressibility and flow on color Doppler imaging. Superficial Great Saphenous Vein: No evidence of thrombus. Normal compressibility and flow on color Doppler imaging. Venous Reflux:  None. Other Findings: There is a prominent lymph node in the left inguinal region with central fatty hilum, felt to be of benign etiology. This lymph node measures 2.8 x 0.7 x 2.0 cm. IMPRESSION: No evidence of deep venous thrombosis within either lower extremity. Note that the right peroneal vein is not well seen due to edema. Interval resolution of presumed right popliteal fossa hematoma. Prominent benign-appearing left inguinal lymph node, likely of reactive etiology. Electronically Signed   By: Lowella Grip III M.D.   On: 07/12/2016 09:34    EKG:   Orders placed or performed during the hospital encounter of 07/11/16  . ED EKG within 10 minutes  . ED EKG within 10 minutes    ASSESSMENT AND PLAN:   * Acute hypoxic respiratory failure 2/2Acute bronchitis and COPD exacerbation    clinically better than yesterday  IV steroid and inhaled nebulizer, Spiriva.   Try to get sputum culture.    Continue supplemental oxygen now and wean off as tolerated   Levaquin oral. Blood cultures with no growth so far Bilateral lower extremity venous Dopplers negative DVT  * Hypertension   Continue lisinopril.  * Elevated white blood cell count   Due to steroids and bronchitis  *Hypokalemia replete and recheck in a.m.     All the records are reviewed and case discussed with Care Management/Social Workerr. Management plans discussed with the  patient, family and they are in agreement.  CODE STATUS: DNR  TOTAL TIME TAKING CARE OF THIS PATIENT: 32minutes.   POSSIBLE D/C IN 2  DAYS, DEPENDING ON CLINICAL CONDITION.  Note: This dictation was prepared with Dragon dictation along with smaller phrase technology. Any transcriptional  errors that result from this process are unintentional.   Nicholes Mango M.D on 07/12/2016 at 3:41 PM  Between 7am to 6pm - Pager - [phone] After 6pm go to [email or web address] - password [email or web address] [facility]  Office  [phone]  CC: Primary care physician; Tracie Harrier, MD

## 2016-07-13 LAB — BASIC METABOLIC PANEL
ANION GAP: 6 (ref 5–15)
BUN: 31 mg/dL — ABNORMAL HIGH (ref 6–20)
CALCIUM: 9 mg/dL (ref 8.9–10.3)
CHLORIDE: 110 mmol/L (ref 101–111)
CO2: 28 mmol/L (ref 22–32)
CREATININE: 0.99 mg/dL (ref 0.44–1.00)
GFR calc non Af Amer: 51 mL/min — ABNORMAL LOW (ref >60)
GFR, EST AFRICAN AMERICAN: 59 mL/min — AB (ref >60)
Glucose, Bld: 156 mg/dL — ABNORMAL HIGH (ref 65–99)
Potassium: 4 mmol/L (ref 3.5–5.1)
SODIUM: 144 mmol/L (ref 135–145)

## 2016-07-13 LAB — CBC
HCT: 32.9 % — ABNORMAL LOW (ref 35.0–47.0)
HEMOGLOBIN: 10.3 g/dL — AB (ref 12.0–16.0)
MCH: 29 pg (ref 26.0–34.0)
MCHC: 31.2 g/dL — AB (ref 32.0–36.0)
MCV: 93 fL (ref 80.0–100.0)
Platelets: 331 10*3/uL (ref 150–440)
RBC: 3.53 MIL/uL — ABNORMAL LOW (ref 3.80–5.20)
RDW: 21.4 % — ABNORMAL HIGH (ref 11.5–14.5)
WBC: 20 10*3/uL — AB (ref 3.6–11.0)

## 2016-07-13 MED ORDER — LEVOFLOXACIN 250 MG PO TABS: 250.0000 mg | ORAL_TABLET | Freq: Every day | ORAL | 0 refills | Status: AC

## 2016-07-13 MED ORDER — ALBUTEROL SULFATE HFA 108 (90 BASE) MCG/ACT IN AERS: 2.0000 | INHALATION_SPRAY | Freq: Four times a day (QID) | RESPIRATORY_TRACT | 2 refills | Status: DC | PRN

## 2016-07-13 MED ORDER — FLUTICASONE-SALMETEROL 100-50 MCG/DOSE IN AEPB: 1.0000 | INHALATION_SPRAY | Freq: Two times a day (BID) | RESPIRATORY_TRACT | 0 refills | Status: DC

## 2016-07-13 MED ORDER — PREDNISONE 10 MG (21) PO TBPK: ORAL_TABLET | ORAL | 0 refills | Status: DC

## 2016-07-13 MED ORDER — IPRATROPIUM-ALBUTEROL 0.5-2.5 (3) MG/3ML IN SOLN
3.0000 mL | Freq: Four times a day (QID) | RESPIRATORY_TRACT | Status: DC
  Filled 2016-07-13: qty 3

## 2016-07-13 NOTE — Evaluation (Signed)
Physical Therapy Evaluation Patient Details Name: [name] MRN: [id number] DOB: 1928-04-15 Today's Date: 07/13/2016   History of Present Illness  Pt is a 83 y/o F who presents with SOB.  Pt's SpO2 85% in the ED and pt was placed on [place] O2.  Pt's PMH includes aneurysm ACA, blindness L eye, SAH, tobacco abuse.    Clinical Impression  Pt admitted with above diagnosis. Pt currently with functional limitations due to the deficits listed below (see PT Problem List). Ms. Heacock was Ind PTA, although she has experienced 3 falls over the past 6 months.  She demonstrates instability and tendency to reach out for support when ambulating without AD but when RW introduced she ambulates with a steady gait with no instability.  SpO2 drops down to 87% on RA which improves quickly to mid 90s when on 1L via [place].  SpO2 in mid 90s on RA sitting in chair at end of session. Encouraged pt to use RW at d/c.  Pt will benefit from skilled PT to increase their independence and safety with mobility to allow discharge to the venue listed below.      Follow Up Recommendations Home health PT    Equipment Recommendations  None recommended by PT    Recommendations for Other Services       Precautions / Restrictions Precautions Precautions: Fall;Other (comment) Precaution Comments: monitor O2 Restrictions Weight Bearing Restrictions: No      Mobility  Bed Mobility               General bed mobility comments: Pt sitting in chair at start and end of session.  Transfers Overall transfer level: Needs assistance Equipment used: Rolling walker (2 wheeled);None Transfers: Sit to/from Stand Sit to Stand: Supervision         General transfer comment: Supervision for safety, pt performs sit>stand safely without cues.  She requires cues to bring RW with her when preparing to sit.  Ambulation/Gait Ambulation/Gait assistance: Supervision;Min guard Ambulation Distance (Feet): 200 Feet Assistive device:  None;Rolling walker (2 wheeled) Gait Pattern/deviations: Step-through pattern;Decreased stride length;Trunk flexed Gait velocity: decreased Gait velocity interpretation: Below normal speed for age/gender General Gait Details: Pt demonstrates some instability when ambulating first 20 ft without AD and reaches out for bed rail, countertop, etc, for support.  Once RW introduced pt demonstrates a steady gait with no signs of instability.  SpO2 drops down to 87% on RA which improves quickly to mid 90s when on 1L via [place].    Stairs            Wheelchair Mobility    Modified Rankin (Stroke Patients Only)       Balance Overall balance assessment: Needs assistance;History of Falls Sitting-balance support: No upper extremity supported;Feet supported Sitting balance-Leahy Scale: Good     Standing balance support: No upper extremity supported;During functional activity Standing balance-Leahy Scale: Fair Standing balance comment: Pt able to stand statically without UE support but requires UE support for dynamic activities.                             Pertinent Vitals/Pain Pain Assessment: No/denies pain    Home Living Family/patient expects to be discharged to:: Private residence Living Arrangements: Children Available Help at Discharge: Family;Available PRN/intermittently Type of Home: House Home Access: Stairs to enter Entrance Stairs-Rails: Right Entrance Stairs-Number of Steps: 2 Home Layout: One level (one short step with no rail up from [employer]) Home Equipment: Gilford Rile -  2 wheels;Hand held shower head;Shower seat;Wheelchair - manual      Prior Function Level of Independence: Independent         Comments: Pt reports she ambulates without AD but reports 3 falls in the past 6 months.  Her daughter lives with her and the pt does the cooking and house chores as her daughter is disabled due to back pain.       Hand Dominance        Extremity/Trunk Assessment    Upper Extremity Assessment Upper Extremity Assessment:  (BUE strength grossly 3+/5)    Lower Extremity Assessment Lower Extremity Assessment:  (BLE strength grossly 4+/5)    Cervical / Trunk Assessment Cervical / Trunk Assessment: Kyphotic  Communication   Communication: No difficulties  Cognition Arousal/Alertness: Awake/alert Behavior During Therapy: WFL for tasks assessed/performed Overall Cognitive Status:  (Unable to ask family in front of pt)                                 General Comments: Pt appears to have some memory deficits.  She did not recall her 3 falls she had in the past 6 months, her son had to remind her of these.      General Comments General comments (skin integrity, edema, etc.): SpO2 in mid 90s on RA sitting in chair at end of session.  Encouraged pt to use RW at d/c for improved safety.  She will likely be able to transition to using no AD after working with [place].    Exercises Other Exercises Other Exercises: Encouraged pt to peform pursed lip breathing while ambulating which she demonstrated back to this PT.     Assessment/Plan    PT Assessment Patient needs continued PT services  PT Problem List Decreased strength;Decreased balance;Cardiopulmonary status limiting activity;Decreased knowledge of use of DME;Decreased safety awareness;Decreased cognition       PT Treatment Interventions DME instruction;Gait training;Stair training;Functional mobility training;Therapeutic activities;Therapeutic exercise;Balance training;Patient/family education    PT Goals (Current goals can be found in the Care Plan section)  Acute Rehab PT Goals Patient Stated Goal: to go home and return to PLOF PT Goal Formulation: With patient Time For Goal Achievement: 07/27/16 Potential to Achieve Goals: Good    Frequency Min 2X/week   Barriers to discharge        Co-evaluation               AM-PAC PT "6 Clicks" Daily Activity  Outcome Measure  Difficulty turning over in bed (including adjusting bedclothes, sheets and blankets)?: None Difficulty moving from lying on back to sitting on the side of the bed? : None Difficulty sitting down on and standing up from a chair with arms (e.g., wheelchair, bedside commode, etc,.)?: A Little Help needed moving to and from a bed to chair (including a wheelchair)?: A Little Help needed walking in hospital room?: A Little Help needed climbing 3-5 steps with a railing? : A Little 6 Click Score: 20    End of Session Equipment Utilized During Treatment: Gait belt;Oxygen Activity Tolerance: Patient tolerated treatment well Patient left: in chair;with call bell/phone within reach;with chair alarm set;with family/visitor present Nurse Communication: Mobility status;Other (comment) (SpO2) PT Visit Diagnosis: Muscle weakness (generalized) (M62.81);Unsteadiness on feet (R26.81)    Time: 6237-6283 PT Time Calculation (min) (ACUTE ONLY): 28 min   Charges:   PT Evaluation $PT Eval Low Complexity: 1 Procedure PT Treatments $Gait Training: 8-22 mins  PT G Codes:        Collie Siad PT, DPT 07/13/2016, 1:37 PM

## 2016-07-13 NOTE — Discharge Summary (Signed)
[facility] NAME: [name]    MR#:  [id number]  DATE OF BIRTH:  [birth year]  DATE OF ADMISSION:  07/11/2016 ADMITTING PHYSICIAN: Vaughan Basta, MD  DATE OF DISCHARGE: 07/13/2016   PRIMARY CARE PHYSICIAN: Tracie Harrier, MD    ADMISSION DIAGNOSIS:  Cough [R05] Hypoxia [R09.02] Upper respiratory tract infection, unspecified type [J06.9]  DISCHARGE DIAGNOSIS:  Principal Problem:   Acute on chronic respiratory failure with hypoxia (HCC) Active Problems:   COPD exacerbation (HCC)   SECONDARY DIAGNOSIS:   Past Medical History:  Diagnosis Date  . Aneurysm of anterior cerebral artery   . Blindness of left eye    secondary to cataract surgery complication  . Depression   . Hypertension   . SAH (subarachnoid hemorrhage) ([place]) 02/2006  . Tobacco abuse     HOSPITAL COURSE:   * Acute hypoxic respiratory failure 2/2Acute bronchitis and COPD exacerbation  clinically better than yesterday IV steroid and inhaled nebulizer, Spiriva. Continue supplemental oxygen now and wean off as tolerated Levaquin oral.  Blood cultures with no growth so far   Bilateral lower extremity venous Dopplers negative DVT  Improved, walked with PT.   Will d/c today with oral steroids and Abx.  * Hypertension Continue lisinopril.  * Elevated white blood cell count Due to steroids and bronchitis  *Hypokalemia replete and recheck in a.m. Improved.  DISCHARGE CONDITIONS:   Stable.  CONSULTS OBTAINED:    DRUG ALLERGIES:  No Known Allergies  DISCHARGE MEDICATIONS:   Current Discharge Medication List    START taking these medications   Details  albuterol (PROVENTIL HFA;VENTOLIN HFA) 108 (90 Base) MCG/ACT inhaler Inhale 2 puffs into the lungs every 6 (six) hours as needed for wheezing or shortness of breath. Qty: 1 Inhaler, Refills: 2    Fluticasone-Salmeterol (ADVAIR DISKUS) 100-50 MCG/DOSE AEPB  Inhale 1 puff into the lungs 2 (two) times daily. Qty: 60 each, Refills: 0    levofloxacin (LEVAQUIN) 250 MG tablet Take 1 tablet (250 mg total) by mouth daily. Qty: 3 tablet, Refills: 0    predniSONE (STERAPRED UNI-PAK 21 TAB) 10 MG (21) TBPK tablet Take 6 tabs first day, 5 tab on day 2, then 4 on day 3rd, 3 tabs on day 4th , 2 tab on day 5th, and 1 tab on 6th day. Qty: 21 tablet, Refills: 0      CONTINUE these medications which have NOT CHANGED   Details  aspirin 81 MG tablet Take 81 mg by mouth daily.    citalopram (CELEXA) 20 MG tablet Take 20 mg by mouth daily.    ferrous sulfate 325 (65 FE) MG tablet Take 325 mg by mouth once a week. Take 1 tablet on Sundays.    lisinopril (PRINIVIL,ZESTRIL) 40 MG tablet Take 40 mg by mouth daily.    omeprazole (PRILOSEC) 20 MG capsule Take 20 mg by mouth every morning.    Vitamin D, Ergocalciferol, (DRISDOL) 50000 units CAPS capsule Take 50,000 Units by mouth every Sunday.    ALPRAZolam (XANAX) 0.25 MG tablet Take 0.25 mg by mouth daily as needed.         DISCHARGE INSTRUCTIONS:    Follow with PMD in 1 week.  If you experience worsening of your admission symptoms, develop shortness of breath, life threatening emergency, suicidal or homicidal thoughts you must seek medical attention immediately by calling 911 or calling your MD immediately  if symptoms less severe.  You Must read complete instructions/literature along with  all the possible adverse reactions/side effects for all the Medicines you take and that have been prescribed to you. Take any new Medicines after you have completely understood and accept all the possible adverse reactions/side effects.   Please note  You were cared for by a hospitalist during your hospital stay. If you have any questions about your discharge medications or the care you received while you were in the hospital after you are discharged, you can call the unit and asked to speak with the hospitalist on  call if the hospitalist that took care of you is not available. Once you are discharged, your primary care physician will handle any further medical issues. Please note that NO REFILLS for any discharge medications will be authorized once you are discharged, as it is imperative that you return to your primary care physician (or establish a relationship with a primary care physician if you do not have one) for your aftercare needs so that they can reassess your need for medications and monitor your lab values.    Today   CHIEF COMPLAINT:   Chief Complaint  Patient presents with  . Cough    HISTORY OF PRESENT ILLNESS:  [name]  is a 83 y.o. female with a known history of Aneurysm in pain, depression, hypertension, subarachnoid hemorrhage, smoking in the past- for last 1 week having some shortness of breath and cough with white colored sputum production and chills. And she is also feeling excessively tired and short of breath with activities for last 1 week. Concerned with this she went to her primary care doctor's office at [facility], they found her to be hypoxic and sent her to the emergency room. In ER she is noted to have oxygen saturation 85% and wheezing so she is given to hospitalist team for further management.  VITAL SIGNS:  Blood pressure (!) 128/59, pulse 70, temperature 98.4 F (36.9 C), temperature source Oral, resp. rate 20, height 5\' 2"  (1.575 m), weight 47.2 kg (104 lb), SpO2 94 %.  I/O:   Intake/Output Summary (Last 24 hours) at 07/13/16 1356 Last data filed at 07/13/16 0800  Gross per 24 hour  Intake              720 ml  Output                0 ml  Net              720 ml    PHYSICAL EXAMINATION:   GENERAL:  83 y.o.-year-old patient lying in the bed with no acute distress.  EYES: Pupils equal, round, reactive to light and accommodation. No scleral icterus. Extraocular muscles intact.  HEENT: Head atraumatic, normocephalic. Oropharynx and nasopharynx clear.  NECK:   Supple, no jugular venous distention. No thyroid enlargement, no tenderness.  LUNGS:  coArse bronchial breath sounds bilaterally, minimal diffuse  wheezing, no  rales,rhonchi or crepitation. No use of accessory muscles of respiration.  CARDIOVASCULAR: S1, S2 normal. No murmurs, rubs, or gallops.  ABDOMEN: Soft, nontender, nondistended. Bowel sounds present. No organomegaly or mass.  EXTREMITIES: No pedal edema, cyanosis, or clubbing.  NEUROLOGIC: Cranial nerves II through XII are intact. Muscle strength 5/5 in all extremities. Sensation intact. Gait not checked.  PSYCHIATRIC: The patient is alert and oriented x 3.  SKIN: No obvious rash, lesion, or ulcer.    DATA REVIEW:   CBC  Recent Labs Lab 07/13/16 0517  WBC 20.0*  HGB 10.3*  HCT 32.9*  PLT 331  Chemistries   Recent Labs Lab 07/13/16 0517  NA 144  K 4.0  CL 110  CO2 28  GLUCOSE 156*  BUN 31*  CREATININE 0.99  CALCIUM 9.0    Cardiac Enzymes No results for input(s): TROPONINI in the last 168 hours.  Microbiology Results  Results for orders placed or performed during the hospital encounter of 07/11/16  Blood culture (routine x 2)     Status: None (Preliminary result)   Collection Time: 07/11/16  2:15 PM  Result Value Ref Range Status   Specimen Description BLOOD BLOOD LEFT FOREARM  Final   Special Requests   Final    BOTTLES DRAWN AEROBIC AND ANAEROBIC Blood Culture results may not be optimal due to an excessive volume of blood received in culture bottles   Culture NO GROWTH 2 DAYS  Final   Report Status PENDING  Incomplete  Blood culture (routine x 2)     Status: None (Preliminary result)   Collection Time: 07/11/16  3:02 PM  Result Value Ref Range Status   Specimen Description BLOOD BLOOD LEFT FOREARM  Final   Special Requests   Final    BOTTLES DRAWN AEROBIC AND ANAEROBIC Blood Culture results may not be optimal due to an excessive volume of blood received in culture bottles   Culture NO GROWTH 2 DAYS   Final   Report Status PENDING  Incomplete    RADIOLOGY:  Dg Chest 2 View  Result Date: 07/11/2016 CLINICAL DATA:  Productive cough for 1 week. EXAM: CHEST  2 VIEW COMPARISON:  None. FINDINGS: Cardiomediastinal silhouette is mildly enlarged. Mediastinal contours appear intact. Tortuosity and calcific atherosclerotic disease of the aorta noted. There is no evidence of focal airspace consolidation, pleural effusion or pneumothorax. The lungs are hyperinflated with diffuse coarsening of the interstitium. Chronic appearing anterior compression deformity of L1 vertebral body with approximately 40% height loss. Soft tissues are grossly normal. IMPRESSION: Hyperinflation of the lungs with chronic interstitial lung changes and bronchiectasis. No evidence of focal airspace consolidation. Calcific atherosclerotic disease of the aorta. Age-indeterminate, likely chronic compression deformity of L1 vertebral body. Please correlate clinically. Electronically Signed   By: Fidela Salisbury M.D.   On: 07/11/2016 14:05   US Venous Img Lower Bilateral  Result Date: 07/12/2016 CLINICAL DATA:  Bilateral lower extremity edema EXAM: BILATERAL LOWER EXTREMITY VENOUS DUPLEX ULTRASOUND TECHNIQUE: Gray-scale sonography with graded compression, as well as color Doppler and duplex ultrasound were performed to evaluate the lower extremity deep venous systems from the level of the common femoral vein and including the common femoral, femoral, profunda femoral, popliteal and calf veins including the posterior tibial, peroneal and gastrocnemius veins when visible. The superficial great saphenous vein was also interrogated. Spectral Doppler was utilized to evaluate flow at rest and with distal augmentation maneuvers in the common femoral, femoral and popliteal veins. COMPARISON:  Right lower extremity venous duplex ultrasound October 11, 2015 FINDINGS: RIGHT LOWER EXTREMITY Common Femoral Vein: No evidence of thrombus. Normal  compressibility, respiratory phasicity and response to augmentation. Saphenofemoral Junction: No evidence of thrombus. Normal compressibility and flow on color Doppler imaging. Profunda Femoral Vein: No evidence of thrombus. Normal compressibility and flow on color Doppler imaging. Femoral Vein: No evidence of thrombus. Normal compressibility, respiratory phasicity and response to augmentation. Popliteal Vein: No evidence of thrombus. Normal compressibility, respiratory phasicity and response to augmentation. Calf Veins: No evidence of thrombus. Normal compressibility and flow on color Doppler imaging. Note that the right peroneal vein not well seen due to surrounding  edema. Superficial Great Saphenous Vein: No evidence of thrombus. Normal compressibility and flow on color Doppler imaging. Venous Reflux:  None. Other Findings: The previously noted presumed hematoma in the right popliteal fossa has resolved since prior study. LEFT LOWER EXTREMITY Common Femoral Vein: No evidence of thrombus. Normal compressibility, respiratory phasicity and response to augmentation. Saphenofemoral Junction: No evidence of thrombus. Normal compressibility and flow on color Doppler imaging. Profunda Femoral Vein: No evidence of thrombus. Normal compressibility and flow on color Doppler imaging. Femoral Vein: No evidence of thrombus. Normal compressibility, respiratory phasicity and response to augmentation. Popliteal Vein: No evidence of thrombus. Normal compressibility, respiratory phasicity and response to augmentation. Calf Veins: No evidence of thrombus. Normal compressibility and flow on color Doppler imaging. Superficial Great Saphenous Vein: No evidence of thrombus. Normal compressibility and flow on color Doppler imaging. Venous Reflux:  None. Other Findings: There is a prominent lymph node in the left inguinal region with central fatty hilum, felt to be of benign etiology. This lymph node measures 2.8 x 0.7 x 2.0 cm.  IMPRESSION: No evidence of deep venous thrombosis within either lower extremity. Note that the right peroneal vein is not well seen due to edema. Interval resolution of presumed right popliteal fossa hematoma. Prominent benign-appearing left inguinal lymph node, likely of reactive etiology. Electronically Signed   By: Lowella Grip III M.D.   On: 07/12/2016 09:34    EKG:   Orders placed or performed during the hospital encounter of 07/11/16  . ED EKG within 10 minutes  . ED EKG within 10 minutes      Management plans discussed with the patient, family and they are in agreement.  CODE STATUS:     Code Status Orders        Start     Ordered   07/11/16 1616  Do not attempt resuscitation (DNR)  Continuous    Question Answer Comment  In the event of cardiac or respiratory ARREST Do not call a "code blue"   In the event of cardiac or respiratory ARREST Do not perform Intubation, CPR, defibrillation or ACLS   In the event of cardiac or respiratory ARREST Use medication by any route, position, wound care, and other measures to relive pain and suffering. May use oxygen, suction and manual treatment of airway obstruction as needed for comfort.      07/11/16 1615    Code Status History    Date Active Date Inactive Code Status Order ID Comments User Context   This patient has a current code status but no historical code status.      TOTAL TIME TAKING CARE OF THIS PATIENT: 35 minutes.    Vaughan Basta M.D on 07/13/2016 at 1:56 PM  Between 7am to 6pm - Pager - [phone]  After 6pm go to [email or web address] - password [email or web address] [facility]  Office  [phone]  CC: Primary care physician; Tracie Harrier, MD   Note: This dictation was prepared with Dragon dictation along with smaller phrase technology. Any transcriptional errors that result from this process are unintentional.

## 2016-07-13 NOTE — Care Management Important Message (Signed)
Important Message  Patient Details  Name: PRECIOUS SEGALL MRN: [id number] Date of Birth: 04-02-1928   Medicare Important Message Given:  N/A - LOS <3 / Initial given by admissions    Jolly Mango, RN 07/13/2016, 1:56 PM

## 2016-07-13 NOTE — Progress Notes (Signed)
Pt AAOx4. Up in chair for breakfast. Appetite improved. Placed on RA at 91 - 92 % oxygen sat. IS observed at 750 and encouraged pt to use this every hour.

## 2016-07-13 NOTE — Care Management Note (Signed)
Case Management Note  Patient Details  Name: [name] MRN: [id number] Date of Birth: 1928/03/04  Subjective/Objective:  Met with patient to discuss PT recommendations of [facility].  Offered choice of agencies.  Referral to [facility]. No DME needs. Weaned to RA.                 Action/Plan: [facility] for [facility]PT  Expected Discharge Date:  07/13/16               Expected Discharge Plan:  [place]  In-House Referral:     Discharge planning Services  CM Consult  Post Acute Care Choice:  Home Health Choice offered to:  Patient  DME Arranged:    DME Agency:     HH Arranged:  PT [place]:  [place]  Status of Service:  Completed, signed off  If discussed at [place] of Stay Meetings, dates discussed:    Additional Comments:  Jolly Mango, RN 07/13/2016, 2:02 PM

## 2016-07-16 LAB — CULTURE, BLOOD (ROUTINE X 2)
Culture: NO GROWTH
Culture: NO GROWTH

## 2017-04-10 ENCOUNTER — Other Ambulatory Visit (HOSPITAL_BASED_OUTPATIENT_CLINIC_OR_DEPARTMENT_OTHER): Payer: Self-pay | Admitting: Internal Medicine

## 2017-04-10 ENCOUNTER — Other Ambulatory Visit: Payer: Self-pay | Admitting: Internal Medicine

## 2017-04-10 DIAGNOSIS — Z1239 Encounter for other screening for malignant neoplasm of breast: Secondary | ICD-10-CM

## 2017-07-10 ENCOUNTER — Other Ambulatory Visit: Payer: Self-pay

## 2017-07-10 ENCOUNTER — Encounter: Payer: Self-pay | Admitting: Oncology

## 2017-07-10 ENCOUNTER — Inpatient Hospital Stay: Payer: Medicare Other

## 2017-07-10 ENCOUNTER — Inpatient Hospital Stay: Payer: Medicare Other | Attending: Oncology | Admitting: Oncology

## 2017-07-10 VITALS — BP 159/66 | HR 69 | Temp 98.4°F | Resp 18 | Wt 99.4 lb

## 2017-07-10 DIAGNOSIS — D649 Anemia, unspecified: Secondary | ICD-10-CM | POA: Diagnosis not present

## 2017-07-10 DIAGNOSIS — Z87891 Personal history of nicotine dependence: Secondary | ICD-10-CM | POA: Insufficient documentation

## 2017-07-10 DIAGNOSIS — R634 Abnormal weight loss: Secondary | ICD-10-CM | POA: Diagnosis not present

## 2017-07-10 DIAGNOSIS — R161 Splenomegaly, not elsewhere classified: Secondary | ICD-10-CM | POA: Diagnosis not present

## 2017-07-10 DIAGNOSIS — D729 Disorder of white blood cells, unspecified: Secondary | ICD-10-CM

## 2017-07-10 DIAGNOSIS — R5381 Other malaise: Secondary | ICD-10-CM | POA: Insufficient documentation

## 2017-07-10 DIAGNOSIS — I10 Essential (primary) hypertension: Secondary | ICD-10-CM | POA: Diagnosis not present

## 2017-07-10 DIAGNOSIS — K921 Melena: Secondary | ICD-10-CM

## 2017-07-10 DIAGNOSIS — R233 Spontaneous ecchymoses: Secondary | ICD-10-CM | POA: Diagnosis not present

## 2017-07-10 DIAGNOSIS — D72829 Elevated white blood cell count, unspecified: Secondary | ICD-10-CM | POA: Insufficient documentation

## 2017-07-10 DIAGNOSIS — D72824 Basophilia: Secondary | ICD-10-CM | POA: Diagnosis not present

## 2017-07-10 DIAGNOSIS — R5383 Other fatigue: Secondary | ICD-10-CM

## 2017-07-10 LAB — CBC WITH DIFFERENTIAL/PLATELET
BAND NEUTROPHILS: 2 %
BASOS PCT: 4 %
Basophils Absolute: 0.5 10*3/uL — ABNORMAL HIGH (ref 0–0.1)
Blasts: 0 %
EOS ABS: 0.1 10*3/uL (ref 0–0.7)
EOS PCT: 1 %
HCT: 36.8 % (ref 35.0–47.0)
Hemoglobin: 11.4 g/dL — ABNORMAL LOW (ref 12.0–16.0)
LYMPHS ABS: 1.1 10*3/uL (ref 1.0–3.6)
Lymphocytes Relative: 9 %
MCH: 28.7 pg (ref 26.0–34.0)
MCHC: 31 g/dL — ABNORMAL LOW (ref 32.0–36.0)
MCV: 92.7 fL (ref 80.0–100.0)
METAMYELOCYTES PCT: 2 %
MONO ABS: 0.7 10*3/uL (ref 0.2–0.9)
MYELOCYTES: 11 %
Monocytes Relative: 6 %
NEUTROS PCT: 63 %
NRBC: 2 /100{WBCs} — AB
Neutro Abs: 9.6 10*3/uL — ABNORMAL HIGH (ref 1.4–6.5)
Other: 2 %
PLATELETS: 424 10*3/uL (ref 150–440)
Promyelocytes Relative: 0 %
RBC: 3.97 MIL/uL (ref 3.80–5.20)
RDW: 22 % — ABNORMAL HIGH (ref 11.5–14.5)
WBC: 12.3 10*3/uL — ABNORMAL HIGH (ref 4.0–10.5)

## 2017-07-10 LAB — APTT: aPTT: 32 seconds (ref 24–36)

## 2017-07-10 LAB — IRON AND TIBC
Iron: 75 ug/dL (ref 28–170)
SATURATION RATIOS: 21 % (ref 10.4–31.8)
TIBC: 362 ug/dL (ref 250–450)
UIBC: 287 ug/dL

## 2017-07-10 LAB — FOLATE: Folate: 10.9 ng/mL (ref >5.9)

## 2017-07-10 LAB — VITAMIN B12: Vitamin B-12: 366 pg/mL (ref 180–914)

## 2017-07-10 LAB — PROTIME-INR
INR: 1.02
Prothrombin Time: 13.3 seconds (ref 11.4–15.2)

## 2017-07-10 LAB — TSH: TSH: 1.553 u[IU]/mL (ref 0.350–4.500)

## 2017-07-10 LAB — FERRITIN: FERRITIN: 46 ng/mL (ref 11–307)

## 2017-07-10 LAB — LACTATE DEHYDROGENASE: LDH: 855 U/L — ABNORMAL HIGH (ref 98–192)

## 2017-07-10 NOTE — Progress Notes (Signed)
New patient in today with daughter.  Referred for elevated WBC and anemia.  Pt reports significant history of cancer in her family history.  Reports weight loss of 12 lbs in last 4 months.  States has "no" appetite or desire to eat".

## 2017-07-10 NOTE — Progress Notes (Signed)
Hematology/Oncology Consult note [facility] [phone] [phone]   Patient Care Team: Tracie Harrier, MD as PCP - General (Internal Medicine)  REFERRING PROVIDER: Tracie Harrier, MD  CHIEF COMPLAINTS/PURPOSE OF CONSULTATION:  Evaluation of leukocytosis and splenomegaly leukocytosis and splenomegaly  HISTORY OF PRESENTING ILLNESS:  [name] is a  84 y.o.  female with PMH listed below who was referred to me for evaluation of leukocytosis and splenomegaly. Patient was recently admitted to outside facility [facility] on Jun 12, 2017 after she developed rectal bleeding.  CT was done during that visit which showed a splenomegaly, spleen enlarged at 14 cm.  There is an indeterminant 1.5 cm right adrenal mass.  Rectal bleeding was considered to be secondary to diverticulitis. Patient follows up with Dr. Ginette Pitman primary care physician. On being 29 2019.  CBC showed white count 14.3, hemoglobin 9.9, MCV 99.4, immature granulocytes 1.2, reticulocyte percentage 7.78 elevated, platelet count 386,000, differential showed neutrophilia, ANC 9.93, basophils 0.47,  Reviewed patient's previous lab work-up.  She has had leukocytosis since October 2018 with similar differential abnormalities. Patient has also reported easy bruising, no episodes of acute bleeding.  She also had weight loss about 12 pounds for the past 2 to 3 months.  Reports feeling fatigued.  She is accompanied by her daughter [name] today to the clinic.   Review of Systems  Constitutional: Positive for malaise/fatigue and weight loss. Negative for chills and fever.  HENT: Negative for congestion, ear discharge, ear pain, nosebleeds, sinus pain and sore throat.   Eyes: Negative for double vision, photophobia, pain, discharge and redness.  Respiratory: Negative for cough, hemoptysis, sputum production, shortness of breath and wheezing.     Cardiovascular: Negative for chest pain, palpitations, orthopnea, claudication and leg swelling.  Gastrointestinal: Positive for blood in stool. Negative for abdominal pain, constipation, diarrhea, heartburn, melena, nausea and vomiting.  Genitourinary: Negative for dysuria, flank pain, frequency and hematuria.  Musculoskeletal: Negative for back pain, myalgias and neck pain.  Skin: Negative for itching and rash.  Neurological: Negative for dizziness, tingling, tremors, focal weakness, weakness and headaches.  Endo/Heme/Allergies: Negative for environmental allergies. Bruises/bleeds easily.  Psychiatric/Behavioral: Negative for depression and hallucinations.    MEDICAL HISTORY:  Past Medical History:  Diagnosis Date  . Aneurysm of anterior cerebral artery   . Blindness of left eye    secondary to cataract surgery complication  . Depression   . Hypertension   . SAH (subarachnoid hemorrhage) ([place]) 02/2006  . Tobacco abuse     SURGICAL HISTORY: Past Surgical History:  Procedure Laterality Date  . ABDOMINAL HYSTERECTOMY    . ANEURYSM COILING    . CATARACT EXTRACTION    . CHOLECYSTECTOMY      SOCIAL HISTORY: Social History   Socioeconomic History  . Marital status: Widowed    Spouse name: Not on file  . Number of children: 1  . Years of education: Not on file  . Highest education level: Not on file  Occupational History  . Not on file  Social Needs  . Financial resource strain: Not on file  . Food insecurity:    Worry: Not on file    Inability: Not on file  . Transportation needs:    Medical: Not on file    Non-medical: Not on file  Tobacco Use  . Smoking status: Former Smoker    Last attempt to quit: 07/31/2015    Years since quitting: 1.9  . Smokeless tobacco: Never  Used  Substance and Sexual Activity  . Alcohol use: Yes  . Drug use: No  . Sexual activity: Not on file  Lifestyle  . Physical activity:    Days per week: Not on file    Minutes per session: Not  on file  . Stress: Not on file  Relationships  . Social connections:    Talks on phone: Not on file    Gets together: Not on file    Attends religious service: Not on file    Active member of club or organization: Not on file    Attends meetings of clubs or organizations: Not on file    Relationship status: Not on file  . Intimate partner violence:    Fear of current or ex partner: Not on file    Emotionally abused: Not on file    Physically abused: Not on file    Forced sexual activity: Not on file  Other Topics Concern  . Not on file  Social History Narrative  . Not on file    FAMILY HISTORY: Family History  Problem Relation Age of Onset  . Breast cancer Daughter 7  . Breast cancer Sister   . Throat cancer Mother   . Lung cancer Father   . Prostate cancer Brother   . Leukemia Brother   . Prostate cancer Brother   . Leukemia Brother     ALLERGIES:  has No Known Allergies.  MEDICATIONS:  Current Outpatient Medications  Medication Sig Dispense Refill  . albuterol (PROVENTIL HFA;VENTOLIN HFA) 108 (90 Base) MCG/ACT inhaler Inhale 2 puffs into the lungs every 6 (six) hours as needed for wheezing or shortness of breath. 1 Inhaler 2  . aspirin 81 MG tablet Take 81 mg by mouth daily.    . citalopram (CELEXA) 20 MG tablet Take 20 mg by mouth daily.    . ferrous sulfate 325 (65 FE) MG tablet Take 325 mg by mouth once a week. Take 1 tablet on Sundays.    Marland Kitchen lisinopril (PRINIVIL,ZESTRIL) 40 MG tablet Take 40 mg by mouth daily.    Marland Kitchen omeprazole (PRILOSEC) 20 MG capsule Take 20 mg by mouth every morning.    . Vitamin D, Ergocalciferol, (DRISDOL) 50000 units CAPS capsule Take 50,000 Units by mouth every Sunday.    . ALPRAZolam (XANAX) 0.25 MG tablet Take 0.25 mg by mouth daily as needed.     No current facility-administered medications for this visit.      PHYSICAL EXAMINATION: ECOG PERFORMANCE STATUS: 1 - Symptomatic but completely ambulatory Vitals:   07/10/17 1521  BP: (!)  159/66  Pulse: 69  Resp: 18  Temp: 98.4 F (36.9 C)   Filed Weights   07/10/17 1521  Weight: 99 lb 6 oz (45.1 kg)    Physical Exam  Constitutional: She is oriented to person, place, and time. She appears well-developed. No distress.  Cachectic  HENT:  Head: Normocephalic and atraumatic.  Right Ear: External ear normal.  Left Ear: External ear normal.  Eyes: Pupils are equal, round, and reactive to light. Conjunctivae and EOM are normal. No scleral icterus.  Neck: Normal range of motion. Neck supple.  Cardiovascular: Normal rate, regular rhythm and normal heart sounds.  Pulmonary/Chest: Effort normal and breath sounds normal. No respiratory distress. She has no wheezes. She has no rales. She exhibits no tenderness.  Abdominal: Soft. Bowel sounds are normal. She exhibits no distension and no mass. There is no tenderness.  Borderline splenomegaly  Musculoskeletal: Normal range of motion. She  exhibits no edema or deformity.  Lymphadenopathy:    She has no cervical adenopathy.  Neurological: She is alert and oriented to person, place, and time. No cranial nerve deficit. Coordination normal.  Skin: Skin is warm and dry. No rash noted.  Psychiatric: She has a normal mood and affect. Her behavior is normal.     LABORATORY DATA:  I have reviewed the data as listed Lab Results  Component Value Date   WBC 20.0 (H) 07/13/2016   HGB 10.3 (L) 07/13/2016   HCT 32.9 (L) 07/13/2016   MCV 93.0 07/13/2016   PLT 331 07/13/2016   Recent Labs    07/11/16 1339 07/12/16 0424 07/13/16 0517  NA 138 143 144  K 4.0 3.3* 4.0  CL 104 107 110  CO2 26 26 28   GLUCOSE 102* 188* 156*  BUN 16 20 31*  CREATININE 0.71 0.92 0.99  CALCIUM 8.8* 9.0 9.0  GFRNONAA >60 55* 51*  GFRAA >60 >60 59*       ASSESSMENT & PLAN:  1. Anemia, unspecified type   2. Splenomegaly   3. Neutrophilia   4. Basophilia    Discussed with patient and her daughter that I am concerned about the combination of  leukocytosis with predominantly neutrophilia and basophilia, anemia and a splenomegaly. She also had immature granulocytes on her peripheral blood.  Concern about underlying bone marrow malignancy. We will check CBC with differential, peripheral blood smear review, LDH, folate, vitamin B12, TSH, BCR ABL FISH, flow cytometry, coags,  # Rectal bleeding: check  iron TIBC ferritin. Patient follows up with [facility] clinic.   all questions were answered. The patient knows to call the clinic with any problems questions or concerns.  Return of visit:7 to 10 days to discuss Lab results Thank you for this kind referral and the opportunity to participate in the care of this patient. A copy of today's note is routed to referring provider  Total face to face encounter time for this patient visit was 45 min. >50% of the time was  spent in counseling and coordination of care.    Earlie Server, MD, PhD Hematology Oncology [facility] Pager- [phone] 07/10/2017

## 2017-07-11 LAB — PATHOLOGIST SMEAR REVIEW

## 2017-07-12 LAB — COMPREHENSIVE METABOLIC PANEL
ALBUMIN: 4.1 g/dL (ref 3.5–5.0)
ALK PHOS: 83 U/L (ref 38–126)
ALT: 15 U/L (ref 14–54)
AST: 33 U/L (ref 15–41)
Anion gap: 10 (ref 5–15)
BUN: 15 mg/dL (ref 6–20)
CHLORIDE: 109 mmol/L (ref 101–111)
CO2: 26 mmol/L (ref 22–32)
Calcium: 9 mg/dL (ref 8.9–10.3)
Creatinine, Ser: 0.95 mg/dL (ref 0.44–1.00)
GFR calc Af Amer: >60 mL/min (ref >60)
GFR calc non Af Amer: 53 mL/min — ABNORMAL LOW (ref >60)
GLUCOSE: 64 mg/dL — AB (ref 65–99)
Potassium: 4.3 mmol/L (ref 3.5–5.1)
SODIUM: 145 mmol/L (ref 135–145)
Total Bilirubin: 0.7 mg/dL (ref 0.3–1.2)
Total Protein: 7.3 g/dL (ref 6.5–8.1)

## 2017-07-14 LAB — COMP PANEL: LEUKEMIA/LYMPHOMA

## 2017-07-20 LAB — BCR-ABL1 FISH
CELLS ANALYZED: 200
Cells Counted: 200

## 2017-07-21 ENCOUNTER — Inpatient Hospital Stay (HOSPITAL_BASED_OUTPATIENT_CLINIC_OR_DEPARTMENT_OTHER): Payer: Medicare Other | Admitting: Oncology

## 2017-07-21 ENCOUNTER — Other Ambulatory Visit: Payer: Self-pay

## 2017-07-21 ENCOUNTER — Encounter: Payer: Self-pay | Admitting: Oncology

## 2017-07-21 ENCOUNTER — Inpatient Hospital Stay: Payer: Medicare Other | Admitting: Oncology

## 2017-07-21 VITALS — BP 183/77 | HR 71 | Temp 96.0°F | Resp 18 | Wt 99.6 lb

## 2017-07-21 DIAGNOSIS — I10 Essential (primary) hypertension: Secondary | ICD-10-CM

## 2017-07-21 DIAGNOSIS — Z87891 Personal history of nicotine dependence: Secondary | ICD-10-CM

## 2017-07-21 DIAGNOSIS — R5381 Other malaise: Secondary | ICD-10-CM

## 2017-07-21 DIAGNOSIS — D72824 Basophilia: Secondary | ICD-10-CM | POA: Diagnosis not present

## 2017-07-21 DIAGNOSIS — D729 Disorder of white blood cells, unspecified: Secondary | ICD-10-CM

## 2017-07-21 DIAGNOSIS — R5383 Other fatigue: Secondary | ICD-10-CM | POA: Diagnosis not present

## 2017-07-21 DIAGNOSIS — R161 Splenomegaly, not elsewhere classified: Secondary | ICD-10-CM

## 2017-07-21 DIAGNOSIS — R634 Abnormal weight loss: Secondary | ICD-10-CM

## 2017-07-21 DIAGNOSIS — D649 Anemia, unspecified: Secondary | ICD-10-CM | POA: Diagnosis not present

## 2017-07-21 DIAGNOSIS — R233 Spontaneous ecchymoses: Secondary | ICD-10-CM

## 2017-07-21 NOTE — Progress Notes (Signed)
Patient here today for follow up.   

## 2017-07-21 NOTE — Progress Notes (Signed)
Hematology/Oncology Follow up note [facility] Telephone:([phone] Fax:([phone]   Patient Care Team: Tracie Harrier, MD as PCP - General (Internal Medicine)  REFERRING PROVIDER: Tracie Harrier, MD  CHIEF COMPLAINTS/PURPOSE OF CONSULTATION:  Evaluation of leukocytosis and splenomegaly leukocytosis and splenomegaly  HISTORY OF PRESENTING ILLNESS:  [name] is a  84 y.o.  female with PMH listed below who was referred to me for evaluation of leukocytosis and splenomegaly. Patient was recently admitted to outside facility [facility] in [facility] on Jun 12, 2017 after she developed rectal bleeding.  CT was done during that visit which showed a splenomegaly, spleen enlarged at 14 cm.  There is an indeterminant 1.5 cm right adrenal mass.  Rectal bleeding was considered to be secondary to diverticulitis. Patient follows up with Dr. Ginette Pitman primary care physician. On being 29 2019.  CBC showed white count 14.3, hemoglobin 9.9, MCV 99.4, immature granulocytes 1.2, reticulocyte percentage 7.78 elevated, platelet count 386,000, differential showed neutrophilia, ANC 9.93, basophils 0.47,  Reviewed patient's previous lab work-up.  She has had leukocytosis since October 2018 with similar differential abnormalities. Patient has also reported easy bruising, no episodes of acute bleeding.  She also had weight loss about 12 pounds for the past 2 to 3 months.  Reports feeling fatigued.  She is accompanied by her daughter [name] today to the clinic.   INTERVAL HISTORY [name] is a 84 y.o. female who has above history reviewed by me today presents for follow up visit for management of Leukocytosis and a splenomegaly.  Patient has had lab work-up at last visit and present to discuss lab results.  Daughter [name] accompanied patient in clinic. Problems and complaints are listed below:  #Rectal bleeding, stable, no more additional  episodes. #Fatigue chronic stable Weight loss#stable. Review of Systems  Constitutional: Positive for malaise/fatigue and weight loss. Negative for chills and fever.  HENT: Negative for congestion, ear discharge, ear pain, nosebleeds, sinus pain and sore throat.   Eyes: Negative for double vision, photophobia, pain, discharge and redness.  Respiratory: Negative for cough, hemoptysis, sputum production, shortness of breath and wheezing.   Cardiovascular: Negative for chest pain, palpitations, orthopnea, claudication and leg swelling.  Gastrointestinal: Negative for abdominal pain, blood in stool, constipation, diarrhea, heartburn, melena, nausea and vomiting.  Genitourinary: Negative for dysuria, flank pain, frequency and hematuria.  Musculoskeletal: Negative for back pain, myalgias and neck pain.  Skin: Negative for itching and rash.  Neurological: Negative for dizziness, tingling, tremors, focal weakness, weakness and headaches.  Endo/Heme/Allergies: Negative for environmental allergies. Bruises/bleeds easily.  Psychiatric/Behavioral: Negative for depression and hallucinations. The patient is not nervous/anxious.     MEDICAL HISTORY:  Past Medical History:  Diagnosis Date  . Aneurysm of anterior cerebral artery   . Blindness of left eye    secondary to cataract surgery complication  . Depression   . Hypertension   . SAH (subarachnoid hemorrhage) ([place]) 02/2006  . Tobacco abuse     SURGICAL HISTORY: Past Surgical History:  Procedure Laterality Date  . ABDOMINAL HYSTERECTOMY    . ANEURYSM COILING    . CATARACT EXTRACTION    . CHOLECYSTECTOMY      SOCIAL HISTORY: Social History   Socioeconomic History  . Marital status: Widowed    Spouse name: Not on file  . Number of children: 1  . Years of education: Not on file  . Highest education level: Not on file  Occupational History  . Not on file  Social Needs  . Financial resource strain: Not on file  . Food insecurity:     Worry: Not on file    Inability: Not on file  . Transportation needs:    Medical: Not on file    Non-medical: Not on file  Tobacco Use  . Smoking status: Former Smoker    Last attempt to quit: 07/31/2015    Years since quitting: 1.9  . Smokeless tobacco: Never Used  Substance and Sexual Activity  . Alcohol use: Yes  . Drug use: No  . Sexual activity: Not on file  Lifestyle  . Physical activity:    Days per week: Not on file    Minutes per session: Not on file  . Stress: Not on file  Relationships  . Social connections:    Talks on phone: Not on file    Gets together: Not on file    Attends religious service: Not on file    Active member of club or organization: Not on file    Attends meetings of clubs or organizations: Not on file    Relationship status: Not on file  . Intimate partner violence:    Fear of current or ex partner: Not on file    Emotionally abused: Not on file    Physically abused: Not on file    Forced sexual activity: Not on file  Other Topics Concern  . Not on file  Social History Narrative  . Not on file    FAMILY HISTORY: Family History  Problem Relation Age of Onset  . Breast cancer Daughter 87  . Breast cancer Sister   . Throat cancer Mother   . Lung cancer Father   . Prostate cancer Brother   . Leukemia Brother   . Prostate cancer Brother   . Leukemia Brother     ALLERGIES:  has No Known Allergies.  MEDICATIONS:  Current Outpatient Medications  Medication Sig Dispense Refill  . albuterol (PROVENTIL HFA;VENTOLIN HFA) 108 (90 Base) MCG/ACT inhaler Inhale 2 puffs into the lungs every 6 (six) hours as needed for wheezing or shortness of breath. 1 Inhaler 2  . aspirin 81 MG tablet Take 81 mg by mouth daily.    . citalopram (CELEXA) 20 MG tablet Take 20 mg by mouth daily.    . ferrous sulfate 325 (65 FE) MG tablet Take 325 mg by mouth once a week. Take 1 tablet on Sundays.    Marland Kitchen lisinopril (PRINIVIL,ZESTRIL) 40 MG tablet Take 40 mg by mouth  daily.    Marland Kitchen omeprazole (PRILOSEC) 20 MG capsule Take 20 mg by mouth every morning.    Marland Kitchen ALPRAZolam (XANAX) 0.25 MG tablet Take 0.25 mg by mouth daily as needed.    . Vitamin D, Ergocalciferol, (DRISDOL) 50000 units CAPS capsule Take 50,000 Units by mouth every Sunday.     No current facility-administered medications for this visit.      PHYSICAL EXAMINATION: ECOG PERFORMANCE STATUS: 1 - Symptomatic but completely ambulatory Vitals:   07/21/17 1312 07/21/17 1313  BP:  (!) 183/77  Pulse:  71  Resp:  18  Temp: (!) 96 F (35.6 C)   SpO2: 95%    Filed Weights   07/21/17 1312  Weight: 99 lb 9.6 oz (45.2 kg)    Physical Exam  Constitutional: She is oriented to person, place, and time. She appears well-developed and well-nourished. No distress.  Cachectic  HENT:  Head: Normocephalic and atraumatic.  Right Ear: External ear normal.  Left Ear: External ear  normal.  Mouth/Throat: Oropharynx is clear and moist.  Eyes: Pupils are equal, round, and reactive to light. Conjunctivae and EOM are normal. No scleral icterus.  Neck: Normal range of motion. Neck supple.  Cardiovascular: Normal rate, regular rhythm and normal heart sounds.  Pulmonary/Chest: Effort normal and breath sounds normal. No respiratory distress. She has no wheezes. She has no rales. She exhibits no tenderness.  Abdominal: Soft. Bowel sounds are normal. She exhibits no distension and no mass. There is no tenderness.  Borderline splenomegaly  Musculoskeletal: Normal range of motion. She exhibits no edema or deformity.  Lymphadenopathy:    She has no cervical adenopathy.  Neurological: She is alert and oriented to person, place, and time. No cranial nerve deficit. Coordination normal.  Skin: Skin is warm and dry. No rash noted.  Psychiatric: She has a normal mood and affect. Her behavior is normal. Thought content normal.     LABORATORY DATA:  I have reviewed the data as listed Lab Results  Component Value Date    WBC 12.3 (H) 07/10/2017   HGB 11.4 (L) 07/10/2017   HCT 36.8 07/10/2017   MCV 92.7 07/10/2017   PLT 424 07/10/2017   Recent Labs    07/10/17 1629  NA 145  K 4.3  CL 109  CO2 26  GLUCOSE 64*  BUN 15  CREATININE 0.95  CALCIUM 9.0  GFRNONAA 53*  GFRAA >60  PROT 7.3  ALBUMIN 4.1  AST 33  ALT 15  ALKPHOS 83  BILITOT 0.7       ASSESSMENT & PLAN:  1. Neutrophilia   2. Splenomegaly   3. Basophilia    #Work-up showed normal TSH, normal iron panel.  High LDH, improved hemoglobin to 11.4.  Persistent neutrophilia and basophilia.  Flow cytometry showed neutrophilia with left shift, less than 1% myeloblasts and basophilia.  CBC and a phenotypic findings suggest a myeloproliferative disorder most likely chronic myeloid leukemia. BCR ABL FISH negative.  Clinically likely CML howev BCR ABL FISH negative. ? Atypical CML?  Bone marrow biopsy was discussed and the patient is not interested.  Given that her WBC trended down, hemoglobin improved, I think is reasonable to hold invasive procedure for now and will repeat blood work-up in 3 months.  CBC, CMP, BCR ABL FISH, and Jak 2 mutation with reflex.   all questions were answered. The patient knows to call the clinic with any problems questions or concerns.  Return of visit: Repeat labs in 3 months. Thank you for this kind referral and the opportunity to participate in the care of this patient. A copy of today's note is routed to referring provider   Earlie Server, MD, PhD Hematology Oncology [facility] Pager- [phone] 07/21/2017

## 2017-10-16 ENCOUNTER — Inpatient Hospital Stay: Payer: Medicare Other | Attending: Oncology

## 2017-10-16 DIAGNOSIS — D72824 Basophilia: Secondary | ICD-10-CM | POA: Diagnosis not present

## 2017-10-16 DIAGNOSIS — Z808 Family history of malignant neoplasm of other organs or systems: Secondary | ICD-10-CM | POA: Diagnosis not present

## 2017-10-16 DIAGNOSIS — Z801 Family history of malignant neoplasm of trachea, bronchus and lung: Secondary | ICD-10-CM | POA: Insufficient documentation

## 2017-10-16 DIAGNOSIS — Z8042 Family history of malignant neoplasm of prostate: Secondary | ICD-10-CM | POA: Insufficient documentation

## 2017-10-16 DIAGNOSIS — Z1589 Genetic susceptibility to other disease: Secondary | ICD-10-CM | POA: Insufficient documentation

## 2017-10-16 DIAGNOSIS — D729 Disorder of white blood cells, unspecified: Secondary | ICD-10-CM | POA: Diagnosis not present

## 2017-10-16 DIAGNOSIS — Z803 Family history of malignant neoplasm of breast: Secondary | ICD-10-CM | POA: Insufficient documentation

## 2017-10-16 DIAGNOSIS — Z806 Family history of leukemia: Secondary | ICD-10-CM | POA: Insufficient documentation

## 2017-10-16 DIAGNOSIS — R634 Abnormal weight loss: Secondary | ICD-10-CM | POA: Diagnosis not present

## 2017-10-16 DIAGNOSIS — R161 Splenomegaly, not elsewhere classified: Secondary | ICD-10-CM | POA: Diagnosis not present

## 2017-10-16 LAB — COMPREHENSIVE METABOLIC PANEL
ALK PHOS: 72 U/L (ref 38–126)
ALT: 13 U/L (ref 0–44)
AST: 26 U/L (ref 15–41)
Albumin: 3.9 g/dL (ref 3.5–5.0)
Anion gap: 7 (ref 5–15)
BUN: 12 mg/dL (ref 8–23)
CO2: 28 mmol/L (ref 22–32)
Calcium: 8.9 mg/dL (ref 8.9–10.3)
Chloride: 106 mmol/L (ref 98–111)
Creatinine, Ser: 0.75 mg/dL (ref 0.44–1.00)
GFR calc Af Amer: >60 mL/min (ref >60)
GFR calc non Af Amer: >60 mL/min (ref >60)
Glucose, Bld: 94 mg/dL (ref 70–99)
Potassium: 3.9 mmol/L (ref 3.5–5.1)
SODIUM: 141 mmol/L (ref 135–145)
Total Bilirubin: 0.9 mg/dL (ref 0.3–1.2)
Total Protein: 6.8 g/dL (ref 6.5–8.1)

## 2017-10-16 LAB — CBC WITH DIFFERENTIAL/PLATELET
BASOS ABS: 0 10*3/uL (ref 0–0.1)
BASOS PCT: 0 %
EOS ABS: 0.3 10*3/uL (ref 0–0.7)
EOS PCT: 2 %
HCT: 37.4 % (ref 35.0–47.0)
HEMOGLOBIN: 11.8 g/dL — AB (ref 12.0–16.0)
Lymphocytes Relative: 15 %
Lymphs Abs: 2.3 10*3/uL (ref 1.0–3.6)
MCH: 28.7 pg (ref 26.0–34.0)
MCHC: 31.5 g/dL — ABNORMAL LOW (ref 32.0–36.0)
MCV: 91.1 fL (ref 80.0–100.0)
METAMYELOCYTES PCT: 2 %
MYELOCYTES: 3 %
Monocytes Absolute: 0.6 10*3/uL (ref 0.2–0.9)
Monocytes Relative: 4 %
Neutro Abs: 12.3 10*3/uL — ABNORMAL HIGH (ref 1.4–6.5)
Neutrophils Relative %: 74 %
Platelets: 371 10*3/uL (ref 150–440)
RBC: 4.11 MIL/uL (ref 3.80–5.20)
RDW: 21.8 % — AB (ref 11.5–14.5)
WBC: 15.5 10*3/uL — ABNORMAL HIGH (ref 4.0–10.5)
nRBC: 3 /100 WBC — ABNORMAL HIGH

## 2017-10-16 LAB — LACTATE DEHYDROGENASE: LDH: 689 U/L — ABNORMAL HIGH (ref 98–192)

## 2017-10-20 LAB — JAK2 V617F, W REFLEX TO CALR/E12/MPL

## 2017-10-26 LAB — BCR-ABL1 FISH
Cells Analyzed: 200
Cells Counted: 200

## 2017-10-27 ENCOUNTER — Inpatient Hospital Stay: Payer: Medicare Other | Admitting: Oncology

## 2017-10-27 ENCOUNTER — Inpatient Hospital Stay (HOSPITAL_BASED_OUTPATIENT_CLINIC_OR_DEPARTMENT_OTHER): Payer: Medicare Other | Admitting: Oncology

## 2017-10-27 ENCOUNTER — Encounter: Payer: Self-pay | Admitting: Oncology

## 2017-10-27 ENCOUNTER — Other Ambulatory Visit: Payer: Self-pay

## 2017-10-27 VITALS — BP 205/83 | HR 64 | Temp 96.1°F | Wt 95.4 lb

## 2017-10-27 DIAGNOSIS — D72824 Basophilia: Secondary | ICD-10-CM

## 2017-10-27 DIAGNOSIS — D72828 Other elevated white blood cell count: Secondary | ICD-10-CM

## 2017-10-27 DIAGNOSIS — Z803 Family history of malignant neoplasm of breast: Secondary | ICD-10-CM

## 2017-10-27 DIAGNOSIS — Z808 Family history of malignant neoplasm of other organs or systems: Secondary | ICD-10-CM

## 2017-10-27 DIAGNOSIS — R161 Splenomegaly, not elsewhere classified: Secondary | ICD-10-CM | POA: Diagnosis not present

## 2017-10-27 DIAGNOSIS — Z1589 Genetic susceptibility to other disease: Secondary | ICD-10-CM

## 2017-10-27 DIAGNOSIS — R634 Abnormal weight loss: Secondary | ICD-10-CM

## 2017-10-27 DIAGNOSIS — Z806 Family history of leukemia: Secondary | ICD-10-CM

## 2017-10-27 DIAGNOSIS — D729 Disorder of white blood cells, unspecified: Secondary | ICD-10-CM

## 2017-10-27 DIAGNOSIS — Z801 Family history of malignant neoplasm of trachea, bronchus and lung: Secondary | ICD-10-CM

## 2017-10-27 DIAGNOSIS — Z8042 Family history of malignant neoplasm of prostate: Secondary | ICD-10-CM

## 2017-10-27 NOTE — Progress Notes (Signed)
Hematology/Oncology Follow up note [facility] [phone] Fax:(336) [phone]   Patient Care Team: Morgan Harrier, MD as PCP - General (Internal Medicine)  REFERRING PROVIDER: Tracie Harrier, MD  REASON FOR VISIT Follow up for treatment of Leukocytosis and a splenomegaly.   HISTORY OF PRESENTING ILLNESS:  [name] is a  84 y.o.  female with PMH listed below who was referred to me for evaluation of leukocytosis and splenomegaly. Patient was recently admitted to outside facility [facility] in [facility] on Jun 12, 2017 after she developed rectal bleeding.  CT was done during that visit which showed a splenomegaly, spleen enlarged at 14 cm.  There is an indeterminant 1.5 cm right adrenal mass.  Rectal bleeding was considered to be secondary to diverticulitis. Patient follows up with Dr. Ginette Pitman primary care physician. On being 29 2019.  CBC showed white count 14.3, hemoglobin 9.9, MCV 99.4, immature granulocytes 1.2, reticulocyte percentage 7.78 elevated, platelet count 386,000, differential showed neutrophilia, ANC 9.93, basophils 0.47,  Reviewed patient's previous lab work-up.  She has had leukocytosis since October 2018 with similar differential abnormalities. Patient has also reported easy bruising, no episodes of acute bleeding.  She also had weight loss about 12 pounds for the past 2 to 3 months.  Reports feeling fatigued.  She is accompanied by her daughter [name] today to the clinic.   INTERVAL HISTORY [name] is a 84 y.o. female who has above history reviewed by me today presents for follow up visit for management of Leukocytosis and a splenomegaly.   Daughter [name] accompanied patient in clinic.  #Fatigue: reports worsening fatigue. Chronic onset, perisistent, no aggravating or improving factors, no associated symptoms.  # Weight loss: lost 4 pounds during the last 3 months. Eats 2 meals a day and  does not have good appetite.  # HTN, today her blood pressure was 205/83. She has not taken any blood pressure today. Daughter tells me that patient sometimes forgets taking her medication.  Patient denies any headache, focal weakness, slurred speech.  Review of Systems  Constitutional: Positive for malaise/fatigue and weight loss. Negative for chills and fever.  HENT: Negative for congestion, ear discharge, ear pain, nosebleeds, sinus pain and sore throat.   Eyes: Negative for double vision, photophobia, pain, discharge and redness.  Respiratory: Negative for cough, hemoptysis, sputum production, shortness of breath and wheezing.   Cardiovascular: Negative for chest pain, palpitations, orthopnea, claudication and leg swelling.  Gastrointestinal: Negative for abdominal pain, blood in stool, constipation, diarrhea, heartburn, melena, nausea and vomiting.  Genitourinary: Negative for dysuria, flank pain, frequency and hematuria.  Musculoskeletal: Negative for back pain, myalgias and neck pain.  Skin: Negative for itching and rash.  Neurological: Negative for dizziness, tingling, tremors, focal weakness, weakness and headaches.  Endo/Heme/Allergies: Negative for environmental allergies. Bruises/bleeds easily.  Psychiatric/Behavioral: Negative for depression and hallucinations. The patient is not nervous/anxious.     MEDICAL HISTORY:  Past Medical History:  Diagnosis Date  . Aneurysm of anterior cerebral artery   . Blindness of left eye    secondary to cataract surgery complication  . Depression   . Hypertension   . SAH (subarachnoid hemorrhage) ([place]) 02/2006  . Tobacco abuse     SURGICAL HISTORY: Past Surgical History:  Procedure Laterality Date  . ABDOMINAL HYSTERECTOMY    . ANEURYSM COILING    . CATARACT EXTRACTION    . CHOLECYSTECTOMY      SOCIAL HISTORY: Social History   Socioeconomic History  .  Marital status: Widowed    Spouse name: Not on file  . Number of children: 1   . Years of education: Not on file  . Highest education level: Not on file  Occupational History  . Not on file  Social Needs  . Financial resource strain: Not on file  . Food insecurity:    Worry: Not on file    Inability: Not on file  . Transportation needs:    Medical: Not on file    Non-medical: Not on file  Tobacco Use  . Smoking status: Former Smoker    Last attempt to quit: 07/31/2015    Years since quitting: 2.2  . Smokeless tobacco: Never Used  Substance and Sexual Activity  . Alcohol use: Yes  . Drug use: No  . Sexual activity: Not on file  Lifestyle  . Physical activity:    Days per week: Not on file    Minutes per session: Not on file  . Stress: Not on file  Relationships  . Social connections:    Talks on phone: Not on file    Gets together: Not on file    Attends religious service: Not on file    Active member of club or organization: Not on file    Attends meetings of clubs or organizations: Not on file    Relationship status: Not on file  . Intimate partner violence:    Fear of current or ex partner: Not on file    Emotionally abused: Not on file    Physically abused: Not on file    Forced sexual activity: Not on file  Other Topics Concern  . Not on file  Social History Narrative  . Not on file    FAMILY HISTORY: Family History  Problem Relation Age of Onset  . Breast cancer Daughter 87  . Breast cancer Sister   . Throat cancer Mother   . Lung cancer Father   . Prostate cancer Brother   . Leukemia Brother   . Prostate cancer Brother   . Leukemia Brother     ALLERGIES:  has No Known Allergies.  MEDICATIONS:  Current Outpatient Medications  Medication Sig Dispense Refill  . albuterol (PROVENTIL HFA;VENTOLIN HFA) 108 (90 Base) MCG/ACT inhaler Inhale 2 puffs into the lungs every 6 (six) hours as needed for wheezing or shortness of breath. 1 Inhaler 2  . aspirin 81 MG tablet Take 81 mg by mouth daily.    . citalopram (CELEXA) 20 MG tablet  Take 20 mg by mouth daily.    . ferrous sulfate 325 (65 FE) MG tablet Take 325 mg by mouth once a week. Take 1 tablet on Sundays.    Marland Kitchen lisinopril (PRINIVIL,ZESTRIL) 40 MG tablet Take 40 mg by mouth daily.    Marland Kitchen omeprazole (PRILOSEC) 20 MG capsule Take 20 mg by mouth every morning.    . Vitamin D, Ergocalciferol, (DRISDOL) 50000 units CAPS capsule Take 50,000 Units by mouth every Sunday.    . ALPRAZolam (XANAX) 0.25 MG tablet Take 0.25 mg by mouth daily as needed.     No current facility-administered medications for this visit.      PHYSICAL EXAMINATION: ECOG PERFORMANCE STATUS: 1 - Symptomatic but completely ambulatory Vitals:   10/27/17 1306  BP: (!) 205/83  Pulse: 64  Temp: (!) 96.1 F (35.6 C)   Filed Weights   10/27/17 1306  Weight: 95 lb 6.4 oz (43.3 kg)    Physical Exam  Constitutional: She is oriented to person, place,  and time. No distress.  Cachectic  HENT:  Head: Normocephalic and atraumatic.  Mouth/Throat: Oropharynx is clear and moist.  Eyes: Pupils are equal, round, and reactive to light. Conjunctivae and EOM are normal. No scleral icterus.  Neck: Normal range of motion. Neck supple.  Cardiovascular: Normal rate, regular rhythm and normal heart sounds.  Pulmonary/Chest: Effort normal and breath sounds normal. No respiratory distress. She has no wheezes. She has no rales. She exhibits no tenderness.  Abdominal: Soft. Bowel sounds are normal. She exhibits no distension and no mass. There is no tenderness.  Borderline splenomegaly  Musculoskeletal: Normal range of motion. She exhibits no edema or deformity.  Lymphadenopathy:    She has no cervical adenopathy.  Neurological: She is alert and oriented to person, place, and time. No cranial nerve deficit. Coordination normal.  Skin: Skin is warm and dry. No rash noted. No erythema.  Psychiatric: She has a normal mood and affect. Her behavior is normal. Thought content normal.     LABORATORY DATA:  I have reviewed  the data as listed Lab Results  Component Value Date   WBC 15.5 (H) 10/16/2017   HGB 11.8 (L) 10/16/2017   HCT 37.4 10/16/2017   MCV 91.1 10/16/2017   PLT 371 10/16/2017   Recent Labs    07/10/17 1629 10/16/17 1108  NA 145 141  K 4.3 3.9  CL 109 106  CO2 26 28  GLUCOSE 64* 94  BUN 15 12  CREATININE 0.95 0.75  CALCIUM 9.0 8.9  GFRNONAA 53* >60  GFRAA >60 >60  PROT 7.3 6.8  ALBUMIN 4.1 3.9  AST 33 26  ALT 15 13  ALKPHOS 83 72  BILITOT 0.7 0.9    Flow cytometry showed neutrophilia with left shift, less than 1% myeloblasts and basophilia.  CBC and a phenotypic findings suggest a myeloproliferative disorder   # BCR ABL FISH negative. 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 Splenomegaly   2. Neutrophilia   3. Basophilia   4. JAK-2 gene mutation   5. 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  Most likely myeloproliferative disorders. I recommend bone marrow biopsy for further evaluation. Patient declined BM biopsy at last visit and I had a lengthy discussion today about the rationale. Patient and her daughter agree with the plan  Discussed with patient about management plan, ie Jakafi, after diagnosis is confirmed.   We spent sufficient time to discuss many aspect of care, questions were answered to patient's satisfaction.  The patient knows to call the clinic with any problems questions or concerns.  Return of visit: 10 days after Bone marrow biopsy.  Orders Placed This Encounter  Procedures  . CT BONE MARROW BIOPSY & ASPIRATION    Standing Status:   Future    Standing Expiration Date:   01/27/2019    Order Specific Question:   Reason for Exam (SYMPTOM  OR DIAGNOSIS REQUIRED)    Answer:   Jak 2 positive. myeloproliferative disorder    Order Specific Question:   Preferred imaging location?    Answer:   [facility]    Order Specific Question:   Radiology Contrast Protocol - do NOT remove  file path    Answer:   [email or web address]    Total face to face encounter time for this patient visit was 55mn. >50% of the time was  spent in counseling and coordination of care.   ZEarlie Server MD, PhD Hematology  [facility] [place]- [phone] 10/27/2017

## 2017-10-27 NOTE — Progress Notes (Signed)
Patient here for follow up. No concerns voiced. Blood pressure elevated, denies dizziness or lightheadedness, but states she did not take BP med this morning.

## 2017-10-31 ENCOUNTER — Telehealth: Payer: Self-pay

## 2017-10-31 NOTE — Telephone Encounter (Signed)
Spoke with patient to notify her about bone marrow biopsy on 11/06/17 @ 0830. Also notified her that scheduling will be contacting her with an appointment to see MD 10 days after. Patient voiced understanding.

## 2017-11-03 ENCOUNTER — Other Ambulatory Visit: Payer: Self-pay | Admitting: Student

## 2017-11-06 ENCOUNTER — Other Ambulatory Visit (HOSPITAL_COMMUNITY)
Admission: RE | Admit: 2017-11-06 | Disposition: A | Discharge status: Home / Self Care | Payer: Medicare Other | Source: Ambulatory Visit | Attending: Oncology | Admitting: Oncology

## 2017-11-06 ENCOUNTER — Ambulatory Visit
Admission: RE | Admit: 2017-11-06 | Discharge: 2017-11-06 | Disposition: A | Discharge status: Home / Self Care | Payer: Medicare Other | Source: Ambulatory Visit | Attending: Oncology | Admitting: Oncology

## 2017-11-06 DIAGNOSIS — D649 Anemia, unspecified: Secondary | ICD-10-CM | POA: Insufficient documentation

## 2017-11-06 DIAGNOSIS — Z7982 Long term (current) use of aspirin: Secondary | ICD-10-CM | POA: Diagnosis not present

## 2017-11-06 DIAGNOSIS — Z79899 Other long term (current) drug therapy: Secondary | ICD-10-CM | POA: Insufficient documentation

## 2017-11-06 DIAGNOSIS — R161 Splenomegaly, not elsewhere classified: Secondary | ICD-10-CM | POA: Insufficient documentation

## 2017-11-06 DIAGNOSIS — I10 Essential (primary) hypertension: Secondary | ICD-10-CM | POA: Diagnosis not present

## 2017-11-06 DIAGNOSIS — Z720 Tobacco use: Secondary | ICD-10-CM | POA: Insufficient documentation

## 2017-11-06 DIAGNOSIS — D72829 Elevated white blood cell count, unspecified: Secondary | ICD-10-CM | POA: Diagnosis present

## 2017-11-06 DIAGNOSIS — F329 Major depressive disorder, single episode, unspecified: Secondary | ICD-10-CM | POA: Insufficient documentation

## 2017-11-06 DIAGNOSIS — Z1589 Genetic susceptibility to other disease: Secondary | ICD-10-CM | POA: Insufficient documentation

## 2017-11-06 LAB — APTT: APTT: 33 s (ref 24–36)

## 2017-11-06 LAB — PROTIME-INR
INR: 1.03
PROTHROMBIN TIME: 13.4 s (ref 11.4–15.2)

## 2017-11-06 LAB — CBC WITH DIFFERENTIAL/PLATELET
BAND NEUTROPHILS: 8 %
BASOS ABS: 0.2 10*3/uL — AB (ref 0–0.1)
BLASTS: 0 %
Basophils Relative: 1 %
EOS ABS: 0 10*3/uL (ref 0–0.7)
Eosinophils Relative: 0 %
HEMATOCRIT: 33.9 % — AB (ref 35.0–47.0)
Hemoglobin: 11.1 g/dL — ABNORMAL LOW (ref 12.0–16.0)
Lymphocytes Relative: 5 %
Lymphs Abs: 0.8 10*3/uL — ABNORMAL LOW (ref 1.0–3.6)
MCH: 30 pg (ref 26.0–34.0)
MCHC: 32.8 g/dL (ref 32.0–36.0)
MCV: 91.5 fL (ref 80.0–100.0)
METAMYELOCYTES PCT: 2 %
MYELOCYTES: 1 %
Monocytes Absolute: 0.6 10*3/uL (ref 0.2–0.9)
Monocytes Relative: 4 %
Neutro Abs: 13.9 10*3/uL — ABNORMAL HIGH (ref 1.4–6.5)
Neutrophils Relative %: 79 %
Other: 0 %
Platelets: 347 10*3/uL (ref 150–440)
Promyelocytes Relative: 0 %
RBC: 3.71 MIL/uL — ABNORMAL LOW (ref 3.80–5.20)
RDW: 22.6 % — ABNORMAL HIGH (ref 11.5–14.5)
WBC: 15.5 10*3/uL — ABNORMAL HIGH (ref 3.6–11.0)
nRBC: 6 /100 WBC — ABNORMAL HIGH

## 2017-11-06 MED ORDER — LIDOCAINE HCL (PF) 1 % IJ SOLN
INTRAMUSCULAR | Status: AC | PRN
  Administered 2017-11-06: 8 mL

## 2017-11-06 MED ORDER — HEPARIN SOD (PORK) LOCK FLUSH 100 UNIT/ML IV SOLN
INTRAVENOUS | Status: AC
  Filled 2017-11-06: qty 5

## 2017-11-06 MED ORDER — MIDAZOLAM HCL 5 MG/5ML IJ SOLN
INTRAMUSCULAR | Status: AC
  Filled 2017-11-06: qty 5

## 2017-11-06 MED ORDER — MIDAZOLAM HCL 5 MG/5ML IJ SOLN
INTRAMUSCULAR | Status: AC | PRN
  Administered 2017-11-06: 1 mg via INTRAVENOUS
  Administered 2017-11-06: 0.5 mg via INTRAVENOUS

## 2017-11-06 MED ORDER — FENTANYL CITRATE (PF) 100 MCG/2ML IJ SOLN
INTRAMUSCULAR | Status: AC
  Filled 2017-11-06: qty 4

## 2017-11-06 MED ORDER — FENTANYL CITRATE (PF) 100 MCG/2ML IJ SOLN
INTRAMUSCULAR | Status: AC | PRN
  Administered 2017-11-06: 50 ug via INTRAVENOUS
  Administered 2017-11-06: 25 ug via INTRAVENOUS

## 2017-11-06 MED ORDER — SODIUM CHLORIDE 0.9 % IV SOLN: INTRAVENOUS | Status: DC

## 2017-11-06 NOTE — Procedures (Signed)
Interventional Radiology Procedure Note  Procedure: CT guided aspirate and core biopsy of right iliac bone Complications: None Recommendations: - Bedrest supine x 1 hrs - Hydrocodone PRN  Pain - Follow biopsy results  Signed,  Allyn Bartelson K. Wilford Merryfield, MD   

## 2017-11-16 ENCOUNTER — Encounter (HOSPITAL_COMMUNITY): Payer: Self-pay | Admitting: Oncology

## 2017-11-16 ENCOUNTER — Inpatient Hospital Stay: Payer: Medicare Other

## 2017-11-16 ENCOUNTER — Inpatient Hospital Stay: Payer: Medicare Other | Attending: Oncology | Admitting: Oncology

## 2017-11-16 ENCOUNTER — Other Ambulatory Visit: Payer: Self-pay

## 2017-11-16 ENCOUNTER — Encounter: Payer: Self-pay | Admitting: Oncology

## 2017-11-16 VITALS — BP 203/79 | HR 60 | Temp 96.5°F | Resp 18 | Wt 95.6 lb

## 2017-11-16 DIAGNOSIS — Z806 Family history of leukemia: Secondary | ICD-10-CM

## 2017-11-16 DIAGNOSIS — R161 Splenomegaly, not elsewhere classified: Secondary | ICD-10-CM | POA: Diagnosis not present

## 2017-11-16 DIAGNOSIS — Z808 Family history of malignant neoplasm of other organs or systems: Secondary | ICD-10-CM

## 2017-11-16 DIAGNOSIS — I10 Essential (primary) hypertension: Secondary | ICD-10-CM

## 2017-11-16 DIAGNOSIS — R634 Abnormal weight loss: Secondary | ICD-10-CM

## 2017-11-16 DIAGNOSIS — Z1589 Genetic susceptibility to other disease: Secondary | ICD-10-CM | POA: Insufficient documentation

## 2017-11-16 DIAGNOSIS — D72829 Elevated white blood cell count, unspecified: Secondary | ICD-10-CM | POA: Diagnosis not present

## 2017-11-16 DIAGNOSIS — Z801 Family history of malignant neoplasm of trachea, bronchus and lung: Secondary | ICD-10-CM | POA: Diagnosis not present

## 2017-11-16 DIAGNOSIS — Z803 Family history of malignant neoplasm of breast: Secondary | ICD-10-CM | POA: Diagnosis not present

## 2017-11-16 DIAGNOSIS — Z7189 Other specified counseling: Secondary | ICD-10-CM

## 2017-11-16 DIAGNOSIS — D7581 Myelofibrosis: Secondary | ICD-10-CM

## 2017-11-16 DIAGNOSIS — Z8042 Family history of malignant neoplasm of prostate: Secondary | ICD-10-CM | POA: Diagnosis not present

## 2017-11-16 NOTE — Progress Notes (Signed)
Hematology/Oncology Follow up note [facility] Telephone:([phone] Fax:([phone]) [phone]   Patient Care Team: Tracie Harrier, MD as PCP - General (Internal Medicine)  REFERRING PROVIDER: Tracie Harrier, MD  REASON FOR VISIT Follow up for treatment of Leukocytosis and a splenomegaly.   HISTORY OF PRESENTING ILLNESS:  [name] is a  84 y.o.  female with PMH listed below who was referred to me for evaluation of leukocytosis and splenomegaly. Patient was recently admitted to outside facility [facility] in [facility] on Jun 12, 2017 after she developed rectal bleeding.  CT was done during that visit which showed a splenomegaly, spleen enlarged at 14 cm.  There is an indeterminant 1.5 cm right adrenal mass.  Rectal bleeding was considered to be secondary to diverticulitis. Patient follows up with Dr. Ginette Pitman primary care physician. On being 29 2019.  CBC showed white count 14.3, hemoglobin 9.9, MCV 99.4, immature granulocytes 1.2, reticulocyte percentage 7.78 elevated, platelet count 386,000, differential showed neutrophilia, ANC 9.93, basophils 0.47,  Reviewed patient's previous lab work-up.  She has had leukocytosis since October 2018 with similar differential abnormalities. Patient has also reported easy bruising, no episodes of acute bleeding.  She also had weight loss about 12 pounds for the past 2 to 3 months.  Reports feeling fatigued.  She is accompanied by her daughter [name] today to the clinic.   INTERVAL HISTORY [name] is a 84 y.o. female who has above history reviewed by me today presents for follow up visit for management of Leukocytosis and  splenomegaly.   Daughter [name] accompanied patient in clinic. During the interval patient got a bone marrow biopsy and present to discuss bone marrow biopsy results. #Fatigue: Continue to feel fatigued.  Chronic onset.  Persistent.  No aggravating or improving factors.   She tells me that she used to be able to do gardening and housework.  Now she barely be able to do any of these..   # Weight loss: 4 pounds weight loss during the 3 months.  Unintentional.  Appetite is fair. #She also endorses itchiness, moderate abdominal fullness  # HTN, today blood pressure was 203/79.  She denies any headache, focal weakness,.slurred speech.  She takes lisinopril 40 mg daily.  Review of Systems  Constitutional: Positive for malaise/fatigue and weight loss. Negative for chills and fever.  HENT: Negative for congestion, ear discharge, ear pain, nosebleeds, sinus pain and sore throat.   Eyes: Negative for double vision, photophobia, pain, discharge and redness.  Respiratory: Negative for cough, hemoptysis, sputum production, shortness of breath and wheezing.   Cardiovascular: Negative for chest pain, palpitations, orthopnea, claudication and leg swelling.  Gastrointestinal: Negative for abdominal pain, blood in stool, constipation, diarrhea, heartburn, melena, nausea and vomiting.  Genitourinary: Negative for dysuria, flank pain, frequency and hematuria.  Musculoskeletal: Negative for back pain, myalgias and neck pain.  Skin: Negative for itching and rash.  Neurological: Negative for dizziness, tingling, tremors, focal weakness, weakness and headaches.  Endo/Heme/Allergies: Negative for environmental allergies. Bruises/bleeds easily.  Psychiatric/Behavioral: Negative for depression and hallucinations. The patient is not nervous/anxious.     MEDICAL HISTORY:  Past Medical History:  Diagnosis Date  . Aneurysm of anterior cerebral artery   . Blindness of left eye    secondary to cataract surgery complication  . Depression   . Hypertension   . SAH (subarachnoid hemorrhage) ([place]) 02/2006  . Tobacco abuse     SURGICAL HISTORY: Past Surgical History:  Procedure Laterality Date  .  ABDOMINAL HYSTERECTOMY    . ANEURYSM COILING    . CATARACT EXTRACTION    .  CHOLECYSTECTOMY      SOCIAL HISTORY: Social History   Socioeconomic History  . Marital status: Widowed    Spouse name: Not on file  . Number of children: 1  . Years of education: Not on file  . Highest education level: Not on file  Occupational History  . Not on file  Social Needs  . Financial resource strain: Not hard at all  . Food insecurity:    Worry: Never true    Inability: Not on file  . Transportation needs:    Medical: Patient refused    Non-medical: Patient refused  Tobacco Use  . Smoking status: Former Smoker    Last attempt to quit: 07/31/2015    Years since quitting: 2.2  . Smokeless tobacco: Never Used  Substance and Sexual Activity  . Alcohol use: Yes    Alcohol/week: 1.0 standard drinks    Types: 1 Glasses of wine per week    Comment: once a month  . Drug use: No  . Sexual activity: Not on file  Lifestyle  . Physical activity:    Days per week: Patient refused    Minutes per session: Patient refused  . Stress: Not at all  Relationships  . Social connections:    Talks on phone: Not on file    Gets together: Not on file    Attends religious service: Not on file    Active member of club or organization: Not on file    Attends meetings of clubs or organizations: Not on file    Relationship status: Not on file  . Intimate partner violence:    Fear of current or ex partner: Patient refused    Emotionally abused: Patient refused    Physically abused: Patient refused    Forced sexual activity: Patient refused  Other Topics Concern  . Not on file  Social History Narrative  . Not on file    FAMILY HISTORY: Family History  Problem Relation Age of Onset  . Breast cancer Daughter 28  . Breast cancer Sister   . Throat cancer Mother   . Lung cancer Father   . Prostate cancer Brother   . Leukemia Brother   . Prostate cancer Brother   . Leukemia Brother     ALLERGIES:  has No Known Allergies.  MEDICATIONS:  Current Outpatient Medications    Medication Sig Dispense Refill  . albuterol (PROVENTIL HFA;VENTOLIN HFA) 108 (90 Base) MCG/ACT inhaler Inhale 2 puffs into the lungs every 6 (six) hours as needed for wheezing or shortness of breath. 1 Inhaler 2  . aspirin 81 MG tablet Take 81 mg by mouth daily.    . citalopram (CELEXA) 20 MG tablet Take 20 mg by mouth daily.    . ferrous sulfate 325 (65 FE) MG tablet Take 325 mg by mouth once a week. Take 1 tablet on Sundays.    Marland Kitchen lisinopril (PRINIVIL,ZESTRIL) 40 MG tablet Take 40 mg by mouth daily.    Marland Kitchen omeprazole (PRILOSEC) 20 MG capsule Take 20 mg by mouth every morning.    . Vitamin D, Ergocalciferol, (DRISDOL) 50000 units CAPS capsule Take 50,000 Units by mouth every Sunday.    . ALPRAZolam (XANAX) 0.25 MG tablet Take 0.25 mg by mouth daily as needed.     No current facility-administered medications for this visit.      PHYSICAL EXAMINATION: ECOG PERFORMANCE STATUS: 1 - Symptomatic  but completely ambulatory Vitals:   11/16/17 1426  BP: (!) 203/79  Pulse: 60  Resp: 18  Temp: (!) 96.5 F (35.8 C)   Filed Weights   11/16/17 1426  Weight: 95 lb 9.6 oz (43.4 kg)    Physical Exam  Constitutional: She is oriented to person, place, and time. No distress.  Cachectic  HENT:  Head: Normocephalic and atraumatic.  Mouth/Throat: Oropharynx is clear and moist.  Eyes: Pupils are equal, round, and reactive to light. Conjunctivae and EOM are normal. No scleral icterus.  Neck: Normal range of motion. Neck supple.  Cardiovascular: Normal rate, regular rhythm and normal heart sounds.  Pulmonary/Chest: Effort normal and breath sounds normal. No respiratory distress. She has no wheezes. She has no rales. She exhibits no tenderness.  Abdominal: Soft. Bowel sounds are normal. She exhibits no distension and no mass. There is no tenderness.  Borderline splenomegaly  Musculoskeletal: Normal range of motion. She exhibits no edema or deformity.  Lymphadenopathy:    She has no cervical  adenopathy.  Neurological: She is alert and oriented to person, place, and time. No cranial nerve deficit. Coordination normal.  Skin: Skin is warm and dry. No rash noted. No erythema.  Psychiatric: She has a normal mood and affect. Her behavior is normal. Thought content normal.     LABORATORY DATA:  I have reviewed the data as listed Lab Results  Component Value Date   WBC 15.5 (H) 11/06/2017   HGB 11.1 (L) 11/06/2017   HCT 33.9 (L) 11/06/2017   MCV 91.5 11/06/2017   PLT 347 11/06/2017   Recent Labs    07/10/17 1629 10/16/17 1108  NA 145 141  K 4.3 3.9  CL 109 106  CO2 26 28  GLUCOSE 64* 94  BUN 15 12  CREATININE 0.95 0.75  CALCIUM 9.0 8.9  GFRNONAA 53* >60  GFRAA >60 >60  PROT 7.3 6.8  ALBUMIN 4.1 3.9  AST 33 26  ALT 15 13  ALKPHOS 83 72  BILITOT 0.7 0.9    Peripheral blood flow cytometry showed neutrophilia with left shift, less than 1% myeloblasts and basophilia.  CBC and a phenotypic findings suggest a myeloproliferative disorder   # BCR ABL FISH negative. 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 Myelofibrosis ([place])   2. Splenomegaly   3. JAK-2 gene mutation   4. Weight loss   5. Goals of care, counseling/discussion   6. Uncontrolled hypertension    # JAK 2 V617 mutation positive, splenomegaly, leukocytosis, constitutional symptoms.   Bone marrow biopsy aspirate showed hypercellular bone marrow with features of myeloproliferative neoplasm.  Discussed with Dr. Gari Crown All aspirate material is suboptimal but "biopsy touch imprint in the peripheral blood mostly show features of myeloproliferative neoplasm.  Particular the primary myelofibrosis especially in conjunction with patient's previously documented Jak 2 mutation.  No apparent increasing blast cells is seen.  An iron stain performed on touch imprints showed scattered offering centroblast.  Letter finding is of uncertain significance in this setting and may represent a dysplastic component.   Cytogenetics shows normal karyotype.   Will send foundation heme for status of high molecular risk mutation status[ ASXL1, SRSF2, S3MH9Q222, EZH2, IDH1/2] . Marland Kitchen GIPSS prognostic model: 1 point or more depending on foundation HEME results.  MIPSS70+ v2.0: She has constitutional symptoms which is 2 points, normal karyotype.  Status of high molecular risk mutational unknown Awaiting foundation heme testing. I think she most likely belongs to a low or intermediate  risk primary myelofibrosis category. Transplant ineligible.    The diagnosis and care plan were discussed with patient in detail.  NCCN guidelines were reviewed and shared with patient.   Ruxolitinib can be considered given her symptoms. Given her advanced age, will start with Jakafi 3m BID, dose reduce considering her age.  The goal of treatment which is to palliate disease, disease related symptoms, improve quality of life and hopefully prolong life was highlighted in our discussion.  Chemotherapy education was provided.  We had discussed the composition of chemotherapy regimen, length of chemo cycle, duration of treatment and the time to assess response to treatment.  Supportive care measures are necessary for patient well-being and will be provided as necessary.  # Uncontrolled hypertension. She takes Lisinopril 476mdaily. Lately her BP has been trending high. She denies any symptoms today. Not willing to go to ER.  Likely a component of suboptimal BP regimen and anxiety as she is coming to discuss bone marrow biopsy results. We discuss about severe consequence of high blood pressure including, stroke, brain bleeding, heart attack, etc.  I advise patient to contact PCP and discuss about adding BP medication.   We spent sufficient time to discuss many aspect of care, questions were answered to patient's satisfaction.  Return of visit:  Orders Placed This Encounter  Procedures  . CBC with Differential/Platelet    Standing Status:    Future    Standing Expiration Date:   11/17/2018  . Comprehensive metabolic panel    Standing Status:   Future    Standing Expiration Date:   11/17/2018    Total face to face encounter time for this patient visit was 40 min. >50% of the time was  spent in counseling and coordination of care.  ZhEarlie ServerMD, PhD Hematology Oncology [facility]- [phone]/11/2017

## 2017-11-16 NOTE — Progress Notes (Signed)
Patient here for follow. Patient's blood pressure is elevated.Pt denies dizziness or lightheadedness. Per patient's daughter, she will notify PCP.

## 2017-11-21 ENCOUNTER — Telehealth: Payer: Self-pay | Admitting: Pharmacist

## 2017-11-21 DIAGNOSIS — D7581 Myelofibrosis: Secondary | ICD-10-CM

## 2017-11-21 NOTE — Telephone Encounter (Signed)
Oral Oncology Pharmacist Encounter   Received notification from [employer] that prior authorization for Shanon Brow is required.   PA submitted on [facility] [id number]  Status is pending   Oral Oncology Clinic will continue to follow.   Darl Pikes, PharmD, BCPS, [facility] Hematology/Oncology Clinical Pharmacist [facility] [place] [facility] [phone]  11/21/2017 3:15 PM

## 2017-11-21 NOTE — Telephone Encounter (Signed)
Oral Oncology Pharmacist Encounter   Prior Authorization for [name] has been approved.     PA# [id number] Effective dates: 10/22/17 through 11/20/20   Oral Oncology Clinic will continue to follow.   Darl Pikes, PharmD, BCPS. BCOP Hematology/Oncology Clinical Pharmacist [facility] Oral [facility]otherapy [facility] [phone]  11/21/2017 3:21 PM

## 2017-11-21 NOTE — Telephone Encounter (Signed)
Oral Oncology Pharmacist Encounter  Received new prescription for Jakafi (ruxolitinib) for the treatment of Myelofibrosis JAK2 gene mutation positive, planned duration until disease progression or unacceptable drug toxicity.  CBC from 11/06/17 and CMP from 10/16/17 reviewed dose appropriate for calculated CrCl and pltc.   Current medication list in Epic reviewed, no DDIs with Jakafi identified.  Prescription has been e-scribed to the [facility] for benefits analysis and approval.  Oral Oncology Clinic will continue to follow for insurance authorization, copayment issues, initial counseling and start date.  Darl Pikes, PharmD, BCPS, [facility] Hematology/Oncology Clinical Pharmacist [facility] [place] [facility] [phone]  11/21/2017 10:47 AM

## 2017-11-22 MED ORDER — RUXOLITINIB PHOSPHATE 10 MG PO TABS: 10.0000 mg | ORAL_TABLET | Freq: Two times a day (BID) | ORAL | 2 refills | Status: DC

## 2017-11-22 NOTE — Telephone Encounter (Signed)
Oral Chemotherapy Pharmacist Encounter  Due to insurance restriction the medication could not be filled at [facility]. Prescription has been e-scribed to [facility].  Supportive information was faxed to [place]. We will continue to follow medication access.   Darl Pikes, PharmD, BCPS, [facility] Hematology/Oncology Clinical Pharmacist [facility] [place] [facility] [phone]  11/22/2017 10:30 AM

## 2017-12-06 ENCOUNTER — Inpatient Hospital Stay: Payer: Medicare Other

## 2017-12-06 ENCOUNTER — Other Ambulatory Visit: Payer: Self-pay

## 2017-12-06 ENCOUNTER — Encounter: Payer: Self-pay | Admitting: Oncology

## 2017-12-06 ENCOUNTER — Telehealth: Payer: Self-pay | Admitting: Pharmacist

## 2017-12-06 ENCOUNTER — Inpatient Hospital Stay (HOSPITAL_BASED_OUTPATIENT_CLINIC_OR_DEPARTMENT_OTHER): Payer: Medicare Other | Admitting: Oncology

## 2017-12-06 VITALS — BP 162/86 | HR 67 | Temp 97.5°F | Resp 18 | Wt 97.4 lb

## 2017-12-06 DIAGNOSIS — D72829 Elevated white blood cell count, unspecified: Secondary | ICD-10-CM | POA: Diagnosis not present

## 2017-12-06 DIAGNOSIS — R161 Splenomegaly, not elsewhere classified: Secondary | ICD-10-CM | POA: Diagnosis not present

## 2017-12-06 DIAGNOSIS — Z7189 Other specified counseling: Secondary | ICD-10-CM

## 2017-12-06 DIAGNOSIS — Z1589 Genetic susceptibility to other disease: Secondary | ICD-10-CM

## 2017-12-06 DIAGNOSIS — D7581 Myelofibrosis: Secondary | ICD-10-CM

## 2017-12-06 DIAGNOSIS — I10 Essential (primary) hypertension: Secondary | ICD-10-CM | POA: Diagnosis not present

## 2017-12-06 DIAGNOSIS — R634 Abnormal weight loss: Secondary | ICD-10-CM

## 2017-12-06 DIAGNOSIS — Z801 Family history of malignant neoplasm of trachea, bronchus and lung: Secondary | ICD-10-CM

## 2017-12-06 DIAGNOSIS — Z803 Family history of malignant neoplasm of breast: Secondary | ICD-10-CM

## 2017-12-06 DIAGNOSIS — Z8042 Family history of malignant neoplasm of prostate: Secondary | ICD-10-CM

## 2017-12-06 DIAGNOSIS — Z808 Family history of malignant neoplasm of other organs or systems: Secondary | ICD-10-CM

## 2017-12-06 DIAGNOSIS — Z806 Family history of leukemia: Secondary | ICD-10-CM

## 2017-12-06 LAB — CBC WITH DIFFERENTIAL/PLATELET
BAND NEUTROPHILS: 3 %
Basophils Absolute: 0.5 10*3/uL — ABNORMAL HIGH (ref 0.0–0.1)
Basophils Relative: 3 %
Eosinophils Absolute: 0.7 10*3/uL — ABNORMAL HIGH (ref 0.0–0.5)
Eosinophils Relative: 4 %
HEMATOCRIT: 38.4 % (ref 36.0–46.0)
Hemoglobin: 11.2 g/dL — ABNORMAL LOW (ref 12.0–15.0)
LYMPHS ABS: 2.4 10*3/uL (ref 0.7–4.0)
Lymphocytes Relative: 14 %
MCH: 28.4 pg (ref 26.0–34.0)
MCHC: 29.2 g/dL — ABNORMAL LOW (ref 30.0–36.0)
MCV: 97.2 fL (ref 80.0–100.0)
MONOS PCT: 4 %
MYELOCYTES: 6 %
Metamyelocytes Relative: 12 %
Monocytes Absolute: 0.7 10*3/uL (ref 0.1–1.0)
NEUTROS ABS: 12.3 10*3/uL — AB (ref 1.7–7.7)
Neutrophils Relative %: 50 %
OTHER: 3 %
PROMYELOCYTES RELATIVE: 1 %
Platelets: 345 10*3/uL (ref 150–400)
RBC: 3.95 MIL/uL (ref 3.87–5.11)
RDW: 21.2 % — AB (ref 11.5–15.5)
WBC: 17.1 10*3/uL — ABNORMAL HIGH (ref 4.0–10.5)
nRBC: 5.7 % — ABNORMAL HIGH (ref 0.0–0.2)

## 2017-12-06 LAB — COMPREHENSIVE METABOLIC PANEL
ALBUMIN: 4.3 g/dL (ref 3.5–5.0)
ALT: 15 U/L (ref 0–44)
AST: 31 U/L (ref 15–41)
Alkaline Phosphatase: 75 U/L (ref 38–126)
Anion gap: 10 (ref 5–15)
BUN: 15 mg/dL (ref 8–23)
CHLORIDE: 107 mmol/L (ref 98–111)
CO2: 26 mmol/L (ref 22–32)
Calcium: 8.9 mg/dL (ref 8.9–10.3)
Creatinine, Ser: 0.75 mg/dL (ref 0.44–1.00)
GFR calc Af Amer: >60 mL/min (ref >60)
GFR calc non Af Amer: >60 mL/min (ref >60)
GLUCOSE: 88 mg/dL (ref 70–99)
POTASSIUM: 4.1 mmol/L (ref 3.5–5.1)
Sodium: 143 mmol/L (ref 135–145)
Total Bilirubin: 0.8 mg/dL (ref 0.3–1.2)
Total Protein: 7.4 g/dL (ref 6.5–8.1)

## 2017-12-06 LAB — PATHOLOGIST SMEAR REVIEW

## 2017-12-06 NOTE — Progress Notes (Signed)
Patient here for follow up. Started taking Jakafi yesterday morning.

## 2017-12-06 NOTE — Telephone Encounter (Signed)
Oral Chemotherapy Pharmacist Encounter  Patient Education I spoke with patient and her daughter following her OV for overview of new oral chemotherapy medication: Jakafi (ruxolitinib) for the treatment of Myelofibrosis JAK2 gene mutation positive, planned duration until disease progression or unacceptable drug toxicity.   Pt is doing well. Counseled patient on administration, dosing, side effects, monitoring, drug-food interactions, safe handling, storage, and disposal. Patient will take 1 tablet (10 mg total) by mouth 2 (two) times daily.  Side effects include but not limited to: decreased plt/hgb, elevation in cholesterol.  Reviewed with patient importance of keeping a medication schedule and plan for any missed doses.  [name] voiced understanding and appreciation. All questions answered. Medication handout provided and consent obtained.  Provided patient with [facility] phone number. Patient knows to call the office with questions or concerns. Oral Chemotherapy Navigation Clinic will continue to follow.  Darl Pikes, PharmD, BCPS, [facility] Hematology/Oncology Clinical Pharmacist [facility] [place] [facility] [phone]  12/06/2017 2:49 PM

## 2017-12-10 NOTE — Progress Notes (Signed)
Hematology/Oncology Follow up note [facility] Telephone:([phone] Fax:([phone]   Patient Care Team: Tracie Harrier, MD as PCP - General (Internal Medicine)  REFERRING PROVIDER: Tracie Harrier, MD  REASON FOR VISIT Follow up for treatment of Leukocytosis and a splenomegaly.   HISTORY OF PRESENTING ILLNESS:  [name] is a  84 y.o.  female with PMH listed below who was referred to me for evaluation of leukocytosis and splenomegaly. Patient was recently admitted to outside facility [facility] in [facility] on Jun 12, 2017 after she developed rectal bleeding.  CT was done during that visit which showed a splenomegaly, spleen enlarged at 14 cm.  There is an indeterminant 1.5 cm right adrenal mass.  Rectal bleeding was considered to be secondary to diverticulitis. Patient follows up with Dr. Ginette Pitman primary care physician. On being 29 2019.  CBC showed white count 14.3, hemoglobin 9.9, MCV 99.4, immature granulocytes 1.2, reticulocyte percentage 7.78 elevated, platelet count 386,000, differential showed neutrophilia, ANC 9.93, basophils 0.47,  Reviewed patient's previous lab work-up.  She has had leukocytosis since October 2018 with similar differential abnormalities. Patient has also reported easy bruising, no episodes of acute bleeding.  She also had weight loss about 12 pounds for the past 2 to 3 months.  Reports feeling fatigued.    Bone marrow biopsy aspirate showed hypercellular bone marrow with features of myeloproliferative neoplasm.  Discussed with Dr. Gari Crown All aspirate material is suboptimal but "biopsy touch imprint in the peripheral blood mostly show features of myeloproliferative neoplasm.  Particular the primary myelofibrosis especially in conjunction with patient's previously documented Jak 2 mutation.  No apparent increasing blast cells is seen.  An iron stain performed on touch imprints showed scattered offering  centroblast.  Letter finding is of uncertain significance in this setting and may represent a dysplastic component.  Cytogenetics shows normal karyotype.   INTERVAL HISTORY Morgan FOLSON is a 84 y.o. female who has above history reviewed by me today presents for follow up visit for management of primary myelofibrosis.   Daughter [name] accompanied patient in clinic. During interval, as obtained approval of starting Jakafi and medication was sent to patient's home. Patient is anxious and request another visit to further talk about rationale and side effects of medication.  Patient continues to have fatigue, weight has been stable since September.  No new complaints today. Her blood pressure has been uncontrolled during recent visits.  Today blood pressure is better controlled.  Systolic 974 with diastolic 86.  Review of Systems  Constitutional: Positive for malaise/fatigue and weight loss. Negative for chills and fever.  HENT: Negative for congestion, ear discharge, ear pain, nosebleeds, sinus pain and sore throat.   Eyes: Negative for double vision, photophobia, pain, discharge and redness.  Respiratory: Negative for cough, hemoptysis, sputum production, shortness of breath and wheezing.   Cardiovascular: Negative for chest pain, palpitations, orthopnea, claudication and leg swelling.  Gastrointestinal: Negative for abdominal pain, blood in stool, constipation, diarrhea, heartburn, melena, nausea and vomiting.  Genitourinary: Negative for dysuria, flank pain, frequency and hematuria.  Musculoskeletal: Negative for back pain, myalgias and neck pain.  Skin: Negative for itching and rash.  Neurological: Negative for dizziness, tingling, tremors, focal weakness, weakness and headaches.  Endo/Heme/Allergies: Negative for environmental allergies. Bruises/bleeds easily.  Psychiatric/Behavioral: Negative for depression and hallucinations. The patient is not nervous/anxious.     MEDICAL  HISTORY:  Past Medical History:  Diagnosis Date  . Aneurysm of anterior cerebral artery   .  Blindness of left eye    secondary to cataract surgery complication  . Depression   . Hypertension   . SAH (subarachnoid hemorrhage) ([place]) 02/2006  . Tobacco abuse     SURGICAL HISTORY: Past Surgical History:  Procedure Laterality Date  . ABDOMINAL HYSTERECTOMY    . ANEURYSM COILING    . CATARACT EXTRACTION    . CHOLECYSTECTOMY      SOCIAL HISTORY: Social History   Socioeconomic History  . Marital status: Widowed    Spouse name: Not on file  . Number of children: 1  . Years of education: Not on file  . Highest education level: Not on file  Occupational History  . Not on file  Social Needs  . Financial resource strain: Not hard at all  . Food insecurity:    Worry: Never true    Inability: Not on file  . Transportation needs:    Medical: Patient refused    Non-medical: Patient refused  Tobacco Use  . Smoking status: Former Smoker    Last attempt to quit: 07/31/2015    Years since quitting: 2.3  . Smokeless tobacco: Never Used  Substance and Sexual Activity  . Alcohol use: Yes    Alcohol/week: 1.0 standard drinks    Types: 1 Glasses of wine per week    Comment: once a month  . Drug use: No  . Sexual activity: Not on file  Lifestyle  . Physical activity:    Days per week: Patient refused    Minutes per session: Patient refused  . Stress: Not at all  Relationships  . Social connections:    Talks on phone: Not on file    Gets together: Not on file    Attends religious service: Not on file    Active member of club or organization: Not on file    Attends meetings of clubs or organizations: Not on file    Relationship status: Not on file  . Intimate partner violence:    Fear of current or ex partner: Patient refused    Emotionally abused: Patient refused    Physically abused: Patient refused    Forced sexual activity: Patient refused  Other Topics Concern  . Not on  file  Social History Narrative  . Not on file    FAMILY HISTORY: Family History  Problem Relation Age of Onset  . Breast cancer Daughter 82  . Breast cancer Sister   . Throat cancer Mother   . Lung cancer Father   . Prostate cancer Brother   . Leukemia Brother   . Prostate cancer Brother   . Leukemia Brother     ALLERGIES:  has No Known Allergies.  MEDICATIONS:  Current Outpatient Medications  Medication Sig Dispense Refill  . albuterol (PROVENTIL HFA;VENTOLIN HFA) 108 (90 Base) MCG/ACT inhaler Inhale 2 puffs into the lungs every 6 (six) hours as needed for wheezing or shortness of breath. 1 Inhaler 2  . ALPRAZolam (XANAX) 0.25 MG tablet Take 0.25 mg by mouth daily as needed.    Marland Kitchen aspirin 81 MG tablet Take 81 mg by mouth daily.    . citalopram (CELEXA) 20 MG tablet Take 20 mg by mouth daily.    . ferrous sulfate 325 (65 FE) MG tablet Take 325 mg by mouth once a week. Take 1 tablet on Sundays.    Marland Kitchen lisinopril (PRINIVIL,ZESTRIL) 40 MG tablet Take 40 mg by mouth daily.    Marland Kitchen omeprazole (PRILOSEC) 20 MG capsule Take 20 mg by mouth every  morning.    . ruxolitinib phosphate (JAKAFI) 10 MG tablet Take 1 tablet (10 mg total) by mouth 2 (two) times daily. 60 tablet 2  . Vitamin D, Ergocalciferol, (DRISDOL) 50000 units CAPS capsule Take 50,000 Units by mouth every Sunday.     No current facility-administered medications for this visit.      PHYSICAL EXAMINATION: ECOG PERFORMANCE STATUS: 1 - Symptomatic but completely ambulatory Vitals:   12/06/17 1348  BP: (!) 162/86  Pulse: 67  Resp: 18  Temp: (!) 97.5 F (36.4 C)   Filed Weights   12/06/17 1348  Weight: 97 lb 6.4 oz (44.2 kg)    Physical Exam  Constitutional: She is oriented to person, place, and time. No distress.  Cachectic  HENT:  Head: Normocephalic and atraumatic.  Mouth/Throat: Oropharynx is clear and moist.  Eyes: Pupils are equal, round, and reactive to light. Conjunctivae and EOM are normal. No scleral  icterus.  Neck: Normal range of motion. Neck supple.  Cardiovascular: Normal rate, regular rhythm and normal heart sounds.  Pulmonary/Chest: Effort normal and breath sounds normal. No respiratory distress. She has no wheezes. She has no rales. She exhibits no tenderness.  Abdominal: Soft. Bowel sounds are normal. She exhibits no distension and no mass. There is no tenderness.  Borderline splenomegaly  Musculoskeletal: Normal range of motion. She exhibits no edema or deformity.  Lymphadenopathy:    She has no cervical adenopathy.  Neurological: She is alert and oriented to person, place, and time. No cranial nerve deficit. Coordination normal.  Skin: Skin is warm and dry. No rash noted. No erythema.  Psychiatric: She has a normal mood and affect. Her behavior is normal. Thought content normal.     LABORATORY DATA:  I have reviewed the data as listed Lab Results  Component Value Date   WBC 17.1 (H) 12/06/2017   HGB 11.2 (L) 12/06/2017   HCT 38.4 12/06/2017   MCV 97.2 12/06/2017   PLT 345 12/06/2017   Recent Labs    07/10/17 1629 10/16/17 1108 12/06/17 1329  NA 145 141 143  K 4.3 3.9 4.1  CL 109 106 107  CO2 '26 28 26  ' GLUCOSE 64* 94 88  BUN '15 12 15  ' CREATININE 0.95 0.75 0.75  CALCIUM 9.0 8.9 8.9  GFRNONAA 53* >60 >60  GFRAA >60 >60 >60  PROT 7.3 6.8 7.4  ALBUMIN 4.1 3.9 4.3  AST 33 26 31  ALT '15 13 15  ' ALKPHOS 83 72 75  BILITOT 0.7 0.9 0.8    Peripheral blood flow cytometry showed neutrophilia with left shift, less than 1% myeloblasts and basophilia.  CBC and a phenotypic findings suggest a myeloproliferative disorder   # BCR ABL FISH negative. 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 Myelofibrosis ([place])    # JAK 2 V617 mutation positive, splenomegaly, leukocytosis, constitutional symptoms.  Pending foundation heme for status of high molecular risk mutation status[ ASXL1, SRSF2, [id number], EZH2, IDH1/2] . GIPSS prognostic model: 1 point or more  depending on foundation HEME results.  MIPSS70+ v2.0: She has constitutional symptoms which is 2 points, normal karyotype.  Status of high molecular risk mutational unknown Awaiting foundation heme testing. Likely intermediate risk or higher.  Given that she is very symptomatic, we discussed about starting Jakafi.  Rationale and potential side effects were discussed with patient and her daughter again.  Chemotherapy education was provided. We had discussed the composition of chemotherapy regimen, length of chemo cycle, duration of treatment and  the time to assess response to treatment.  Supportive care measures are necessary for patient well-being and will be provided as necessary. She will also meet pharmacist Ebony Hail today for further discussion of potential side effects. Follow-up with weekly CBC.  I will see patient in 3 weeks. We spent sufficient time to discuss many aspect of care, questions were answered to patient's satisfaction.  Return of visit:  Orders Placed This Encounter  Procedures  . CBC with Differential/Platelet    Standing Status:   Future    Standing Expiration Date:   12/07/2018  . CBC with Differential/Platelet    Standing Status:   Future    Standing Expiration Date:   12/07/2018  . CBC with Differential/Platelet    Standing Status:   Future    Standing Expiration Date:   12/07/2018  . Comprehensive metabolic panel    Standing Status:   Future    Standing Expiration Date:   12/07/2018    Earlie Server, MD, PhD Hematology Oncology [facility] Pager- [phone] 12/10/2017

## 2017-12-13 ENCOUNTER — Inpatient Hospital Stay: Payer: Medicare Other | Attending: Oncology

## 2017-12-13 DIAGNOSIS — Z801 Family history of malignant neoplasm of trachea, bronchus and lung: Secondary | ICD-10-CM | POA: Diagnosis not present

## 2017-12-13 DIAGNOSIS — Z803 Family history of malignant neoplasm of breast: Secondary | ICD-10-CM | POA: Diagnosis not present

## 2017-12-13 DIAGNOSIS — D72829 Elevated white blood cell count, unspecified: Secondary | ICD-10-CM | POA: Insufficient documentation

## 2017-12-13 DIAGNOSIS — Z808 Family history of malignant neoplasm of other organs or systems: Secondary | ICD-10-CM | POA: Insufficient documentation

## 2017-12-13 DIAGNOSIS — D7581 Myelofibrosis: Secondary | ICD-10-CM | POA: Diagnosis not present

## 2017-12-13 DIAGNOSIS — R161 Splenomegaly, not elsewhere classified: Secondary | ICD-10-CM | POA: Insufficient documentation

## 2017-12-13 DIAGNOSIS — R634 Abnormal weight loss: Secondary | ICD-10-CM | POA: Insufficient documentation

## 2017-12-13 DIAGNOSIS — Z1589 Genetic susceptibility to other disease: Secondary | ICD-10-CM | POA: Insufficient documentation

## 2017-12-13 DIAGNOSIS — Z806 Family history of leukemia: Secondary | ICD-10-CM | POA: Insufficient documentation

## 2017-12-13 LAB — CBC WITH DIFFERENTIAL/PLATELET
BASOS PCT: 3 %
Basophils Absolute: 0.3 10*3/uL — ABNORMAL HIGH (ref 0.0–0.1)
EOS ABS: 0.1 10*3/uL (ref 0.0–0.5)
EOS PCT: 1 %
HCT: 33.3 % — ABNORMAL LOW (ref 36.0–46.0)
HEMOGLOBIN: 9.9 g/dL — AB (ref 12.0–15.0)
Lymphocytes Relative: 14 %
Lymphs Abs: 1.6 10*3/uL (ref 0.7–4.0)
MCH: 28.4 pg (ref 26.0–34.0)
MCHC: 29.7 g/dL — ABNORMAL LOW (ref 30.0–36.0)
MCV: 95.7 fL (ref 80.0–100.0)
MONO ABS: 1 10*3/uL (ref 0.1–1.0)
MONOS PCT: 9 %
Metamyelocytes Relative: 11 %
Myelocytes: 2 %
NEUTROS PCT: 59 %
Neutro Abs: 8.5 10*3/uL — ABNORMAL HIGH (ref 1.7–7.7)
PROMYELOCYTES RELATIVE: 1 %
Platelets: 312 10*3/uL (ref 150–400)
RBC: 3.48 MIL/uL — ABNORMAL LOW (ref 3.87–5.11)
RDW: 20.4 % — ABNORMAL HIGH (ref 11.5–15.5)
WBC: 11.6 10*3/uL — ABNORMAL HIGH (ref 4.0–10.5)
nRBC: 2.2 % — ABNORMAL HIGH (ref 0.0–0.2)

## 2017-12-14 ENCOUNTER — Ambulatory Visit: Payer: Medicare Other | Admitting: Oncology

## 2017-12-14 ENCOUNTER — Other Ambulatory Visit: Payer: Medicare Other

## 2017-12-20 ENCOUNTER — Other Ambulatory Visit: Payer: Medicare Other

## 2017-12-20 ENCOUNTER — Inpatient Hospital Stay: Payer: Medicare Other

## 2017-12-20 ENCOUNTER — Other Ambulatory Visit: Payer: Self-pay

## 2017-12-20 DIAGNOSIS — D7581 Myelofibrosis: Secondary | ICD-10-CM

## 2017-12-20 LAB — CBC WITH DIFFERENTIAL/PLATELET
HCT: 33.8 % — ABNORMAL LOW (ref 36.0–46.0)
Hemoglobin: 10 g/dL — ABNORMAL LOW (ref 12.0–15.0)
MCH: 27.9 pg (ref 26.0–34.0)
MCHC: 29.6 g/dL — ABNORMAL LOW (ref 30.0–36.0)
MCV: 94.2 fL (ref 80.0–100.0)
NRBC: 7.1 % — AB (ref 0.0–0.2)
Platelets: 199 10*3/uL (ref 150–400)
RBC: 3.59 MIL/uL — ABNORMAL LOW (ref 3.87–5.11)
RDW: 20.6 % — AB (ref 11.5–15.5)
WBC: 10.5 10*3/uL (ref 4.0–10.5)

## 2017-12-20 LAB — DIFFERENTIAL
ABS IMMATURE GRANULOCYTES: 2.08 10*3/uL — AB (ref 0.00–0.07)
BASOS ABS: 0.5 10*3/uL — AB (ref 0.0–0.1)
BASOS PCT: 5 %
EOS ABS: 0.1 10*3/uL (ref 0.0–0.5)
Eosinophils Relative: 1 %
Immature Granulocytes: 19 %
Lymphocytes Relative: 11 %
Lymphs Abs: 1.2 10*3/uL (ref 0.7–4.0)
MONO ABS: 0.8 10*3/uL (ref 0.1–1.0)
Monocytes Relative: 7 %
NEUTROS ABS: 6.1 10*3/uL (ref 1.7–7.7)
NEUTROS PCT: 57 %
WBC Morphology: 10

## 2017-12-27 ENCOUNTER — Encounter: Payer: Self-pay | Admitting: Emergency Medicine

## 2017-12-27 ENCOUNTER — Inpatient Hospital Stay: Payer: Medicare Other

## 2017-12-27 ENCOUNTER — Inpatient Hospital Stay (HOSPITAL_BASED_OUTPATIENT_CLINIC_OR_DEPARTMENT_OTHER): Payer: Medicare Other | Admitting: Oncology

## 2017-12-27 ENCOUNTER — Other Ambulatory Visit: Payer: Self-pay

## 2017-12-27 ENCOUNTER — Emergency Department
Admission: EM | Admit: 2017-12-27 | Discharge: 2017-12-27 | Disposition: A | Discharge status: Home / Self Care | Payer: Medicare Other | Attending: Emergency Medicine | Admitting: Emergency Medicine

## 2017-12-27 ENCOUNTER — Encounter: Payer: Self-pay | Admitting: Oncology

## 2017-12-27 ENCOUNTER — Telehealth: Payer: Self-pay | Admitting: Emergency Medicine

## 2017-12-27 VITALS — BP 206/82 | HR 58 | Temp 96.2°F | Resp 18 | Wt 97.9 lb

## 2017-12-27 DIAGNOSIS — Z5321 Procedure and treatment not carried out due to patient leaving prior to being seen by health care provider: Secondary | ICD-10-CM | POA: Diagnosis not present

## 2017-12-27 DIAGNOSIS — D72829 Elevated white blood cell count, unspecified: Secondary | ICD-10-CM

## 2017-12-27 DIAGNOSIS — Z803 Family history of malignant neoplasm of breast: Secondary | ICD-10-CM

## 2017-12-27 DIAGNOSIS — Z5111 Encounter for antineoplastic chemotherapy: Secondary | ICD-10-CM | POA: Insufficient documentation

## 2017-12-27 DIAGNOSIS — R161 Splenomegaly, not elsewhere classified: Secondary | ICD-10-CM

## 2017-12-27 DIAGNOSIS — D7581 Myelofibrosis: Secondary | ICD-10-CM | POA: Diagnosis not present

## 2017-12-27 DIAGNOSIS — Z806 Family history of leukemia: Secondary | ICD-10-CM

## 2017-12-27 DIAGNOSIS — R634 Abnormal weight loss: Secondary | ICD-10-CM

## 2017-12-27 DIAGNOSIS — Z801 Family history of malignant neoplasm of trachea, bronchus and lung: Secondary | ICD-10-CM

## 2017-12-27 DIAGNOSIS — Z7189 Other specified counseling: Secondary | ICD-10-CM

## 2017-12-27 DIAGNOSIS — Z1589 Genetic susceptibility to other disease: Secondary | ICD-10-CM

## 2017-12-27 DIAGNOSIS — I10 Essential (primary) hypertension: Secondary | ICD-10-CM | POA: Diagnosis not present

## 2017-12-27 DIAGNOSIS — Z808 Family history of malignant neoplasm of other organs or systems: Secondary | ICD-10-CM

## 2017-12-27 LAB — CBC WITH DIFFERENTIAL/PLATELET
Abs Immature Granulocytes: 2.8 10*3/uL — ABNORMAL HIGH (ref 0.00–0.07)
BAND NEUTROPHILS: 4 %
Basophils Absolute: 0.8 10*3/uL — ABNORMAL HIGH (ref 0.0–0.1)
Basophils Relative: 6 %
Eosinophils Absolute: 0.3 10*3/uL (ref 0.0–0.5)
Eosinophils Relative: 2 %
HEMATOCRIT: 32.1 % — AB (ref 36.0–46.0)
Hemoglobin: 9.5 g/dL — ABNORMAL LOW (ref 12.0–15.0)
Lymphocytes Relative: 18 %
Lymphs Abs: 2.3 10*3/uL (ref 0.7–4.0)
MCH: 28 pg (ref 26.0–34.0)
MCHC: 29.6 g/dL — AB (ref 30.0–36.0)
MCV: 94.7 fL (ref 80.0–100.0)
MONOS PCT: 3 %
MYELOCYTES: 5 %
Metamyelocytes Relative: 16 %
Monocytes Absolute: 0.4 10*3/uL (ref 0.1–1.0)
Neutro Abs: 5.9 10*3/uL (ref 1.7–7.7)
Neutrophils Relative %: 43 %
Other: 2 %
Platelets: 215 10*3/uL (ref 150–400)
Promyelocytes Relative: 1 %
RBC: 3.39 MIL/uL — AB (ref 3.87–5.11)
RDW: 20.9 % — AB (ref 11.5–15.5)
WBC: 12.5 10*3/uL — AB (ref 4.0–10.5)
nRBC: 9.2 % — ABNORMAL HIGH (ref 0.0–0.2)

## 2017-12-27 LAB — COMPREHENSIVE METABOLIC PANEL
ALT: 24 U/L (ref 0–44)
AST: 38 U/L (ref 15–41)
Albumin: 4.3 g/dL (ref 3.5–5.0)
Alkaline Phosphatase: 51 U/L (ref 38–126)
Anion gap: 8 (ref 5–15)
BILIRUBIN TOTAL: 0.7 mg/dL (ref 0.3–1.2)
BUN: 19 mg/dL (ref 8–23)
CALCIUM: 9.1 mg/dL (ref 8.9–10.3)
CO2: 26 mmol/L (ref 22–32)
Chloride: 107 mmol/L (ref 98–111)
Creatinine, Ser: 0.82 mg/dL (ref 0.44–1.00)
Glucose, Bld: 100 mg/dL — ABNORMAL HIGH (ref 70–99)
Potassium: 4.5 mmol/L (ref 3.5–5.1)
Sodium: 141 mmol/L (ref 135–145)
Total Protein: 7.2 g/dL (ref 6.5–8.1)

## 2017-12-27 LAB — PATHOLOGIST SMEAR REVIEW

## 2017-12-27 NOTE — Telephone Encounter (Signed)
Called patient due to lwot to inquire about condition and follow up plans. Left message.   

## 2017-12-27 NOTE — ED Triage Notes (Signed)
Patient sent by Dr. Tasia Catchings for concerns of high blood pressure. Patient reports history of high blood pressure and takes lisinopril at home. Reports for the last several months, her blood pressure has been intermittently high at appointments and at home. Patient's blood pressure this morning at the cancer center was 206/65. Patient denies any chest pain or headache.

## 2017-12-27 NOTE — ED Notes (Signed)
Spoke with lab who states they should be able to add on a troponin to blood work drawn in [place]. They will call to inform us if they cannot.

## 2017-12-27 NOTE — Progress Notes (Signed)
Hematology/Oncology Follow up note [facility] Telephone:(336) [phone] Fax:(336) [phone]   Patient Care Team: Tracie Harrier, MD as PCP - General (Internal Medicine)  REFERRING PROVIDER: Tracie Harrier, MD  REASON FOR VISIT Follow up for treatment of Leukocytosis and a splenomegaly.   HISTORY OF PRESENTING ILLNESS:  [name] is a  84 y.o.  female with PMH listed below who was referred to me for evaluation of leukocytosis and splenomegaly. Patient was recently admitted to outside facility [facility] in [facility] on Jun 12, 2017 after she developed rectal bleeding.  CT was done during that visit which showed a splenomegaly, spleen enlarged at 14 cm.  There is an indeterminant 1.5 cm right adrenal mass.  Rectal bleeding was considered to be secondary to diverticulitis. Patient follows up with Dr. Ginette Pitman primary care physician. On being 29 2019.  CBC showed white count 14.3, hemoglobin 9.9, MCV 99.4, immature granulocytes 1.2, reticulocyte percentage 7.78 elevated, platelet count 386,000, differential showed neutrophilia, ANC 9.93, basophils 0.47,  Reviewed patient's previous lab work-up.  She has had leukocytosis since October 2018 with similar differential abnormalities. Patient has also reported easy bruising, no episodes of acute bleeding.  She also had weight loss about 12 pounds for the past 2 to 3 months.  Reports feeling fatigued.    Bone marrow biopsy aspirate showed hypercellular bone marrow with features of myeloproliferative neoplasm.  Discussed with Dr. Gari Crown All aspirate material is suboptimal but "biopsy touch imprint in the peripheral blood mostly show features of myeloproliferative neoplasm.  Particular the primary myelofibrosis especially in conjunction with patient's previously documented Jak 2 mutation.  No apparent increasing blast cells is seen.  An iron stain performed on touch imprints showed scattered offering  centroblast.  Letter finding is of uncertain significance in this setting and may represent a dysplastic component.  Cytogenetics shows normal karyotype.   INTERVAL HISTORY [name] is a 84 y.o. female who has above history reviewed by me today presents for follow up for primary myelofibrosis.  Daughter [name] accompanied patient to clinic visit today.  She has started Jakafi 43m BID.  Report fatigue is better.  Appetite has also improved.  Night sweat has resolved as well.  Blood pressure is high, 206/82. Her blood pressure has not been well controlled lately. She has appointment in December to see primary care provider. Denies any chest pain or headache.  History of SAH   Review of Systems  Constitutional: Negative for appetite change, chills, fatigue and fever.  HENT:   Negative for hearing loss and voice change.   Eyes: Negative for eye problems.  Respiratory: Negative for chest tightness and cough.   Cardiovascular: Negative for chest pain.  Gastrointestinal: Negative for abdominal distention, abdominal pain and blood in stool.  Endocrine: Negative for hot flashes.  Genitourinary: Negative for difficulty urinating and frequency.   Musculoskeletal: Negative for arthralgias.  Skin: Negative for itching and rash.  Neurological: Negative for extremity weakness.  Hematological: Negative for adenopathy.  Psychiatric/Behavioral: Negative for confusion.    MEDICAL HISTORY:  Past Medical History:  Diagnosis Date  . Aneurysm of anterior cerebral artery   . Blindness of left eye    secondary to cataract surgery complication  . Depression   . Hypertension   . SAH ([place]arachnoid hemorrhage) ([place] 02/2006  . Tobacco abuse     SURGICAL HISTORY: Past Surgical History:  Procedure Laterality Date  . ABDOMINAL HYSTERECTOMY    . ANEURYSM COILING    .  CATARACT EXTRACTION    . CHOLECYSTECTOMY      SOCIAL HISTORY: Social History   Socioeconomic History  . Marital  status: Widowed    Spouse name: Not on file  . Number of children: 1  . Years of education: Not on file  . Highest education level: Not on file  Occupational History  . Not on file  Social Needs  . Financial resource strain: Not hard at all  . Food insecurity:    Worry: Never true    Inability: Not on file  . Transportation needs:    Medical: Patient refused    Non-medical: Patient refused  Tobacco Use  . Smoking status: Former Smoker    Last attempt to quit: 07/31/2015    Years since quitting: 2.4  . Smokeless tobacco: Never Used  Substance and Sexual Activity  . Alcohol use: Yes    Alcohol/week: 1.0 standard drinks    Types: 1 Glasses of wine per week    Comment: once a month  . Drug use: No  . Sexual activity: Not on file  Lifestyle  . Physical activity:    Days per week: Patient refused    Minutes per session: Patient refused  . Stress: Not at all  Relationships  . Social connections:    Talks on phone: Not on file    Gets together: Not on file    Attends religious service: Not on file    Active member of club or organization: Not on file    Attends meetings of clubs or organizations: Not on file    Relationship status: Not on file  . Intimate partner violence:    Fear of current or ex partner: Patient refused    Emotionally abused: Patient refused    Physically abused: Patient refused    Forced sexual activity: Patient refused  Other Topics Concern  . Not on file  Social History Narrative  . Not on file    FAMILY HISTORY: Family History  Problem Relation Age of Onset  . Breast cancer Daughter 48  . Breast cancer Sister   . Throat cancer Mother   . Lung cancer Father   . Prostate cancer Brother   . Leukemia Brother   . Prostate cancer Brother   . Leukemia Brother     ALLERGIES:  has No Known Allergies.  MEDICATIONS:  Current Outpatient Medications  Medication Sig Dispense Refill  . albuterol (PROVENTIL HFA;VENTOLIN HFA) 108 (90 Base) MCG/ACT  inhaler Inhale 2 puffs into the lungs every 6 (six) hours as needed for wheezing or shortness of breath. 1 Inhaler 2  . ALPRAZolam (XANAX) 0.25 MG tablet Take 0.25 mg by mouth daily as needed.    Marland Kitchen aspirin 81 MG tablet Take 81 mg by mouth daily.    . citalopram (CELEXA) 20 MG tablet Take 20 mg by mouth daily.    . ferrous sulfate 325 (65 FE) MG tablet Take 325 mg by mouth once a week. Take 1 tablet on Sundays.    Marland Kitchen lisinopril (PRINIVIL,ZESTRIL) 40 MG tablet Take 40 mg by mouth daily.    Marland Kitchen omeprazole (PRILOSEC) 20 MG capsule Take 20 mg by mouth every morning.    . ruxolitinib phosphate (JAKAFI) 10 MG tablet Take 1 tablet (10 mg total) by mouth 2 (two) times daily. 60 tablet 2  . Vitamin D, Ergocalciferol, (DRISDOL) 50000 units CAPS capsule Take 50,000 Units by mouth every Sunday.     No current facility-administered medications for this visit.  PHYSICAL EXAMINATION: ECOG PERFORMANCE STATUS: 1 - Symptomatic but completely ambulatory Vitals:   12/27/17 1033  BP: (!) 206/82  Pulse: (!) 58  Resp: 18  Temp: (!) 96.2 F (35.7 C)   Filed Weights   12/27/17 1033  Weight: 97 lb 14.4 oz (44.4 kg)    Physical Exam  Constitutional: She is oriented to person, place, and time. No distress.  Cachectic  HENT:  Head: Normocephalic and atraumatic.  Mouth/Throat: Oropharynx is clear and moist.  Eyes: Pupils are equal, round, and reactive to light. Conjunctivae and EOM are normal. No scleral icterus.  Neck: Normal range of motion. Neck supple.  Cardiovascular: Normal rate, regular rhythm and normal heart sounds.  Pulmonary/Chest: Effort normal and breath sounds normal. No respiratory distress. She has no wheezes. She has no rales. She exhibits no tenderness.  Abdominal: Soft. Bowel sounds are normal. She exhibits no distension and no mass. There is no tenderness.  Borderline splenomegaly  Musculoskeletal: Normal range of motion. She exhibits no edema or deformity.  Lymphadenopathy:    She  has no cervical adenopathy.  Neurological: She is alert and oriented to person, place, and time. No cranial nerve deficit. Coordination normal.  Skin: Skin is warm and dry. No rash noted. No erythema.  Psychiatric: She has a normal mood and affect. Her behavior is normal. Thought content normal.     LABORATORY DATA:  I have reviewed the data as listed Lab Results  Component Value Date   WBC 12.5 (H) 12/27/2017   HGB 9.5 (L) 12/27/2017   HCT 32.1 (L) 12/27/2017   MCV 94.7 12/27/2017   PLT 215 12/27/2017   Recent Labs    10/16/17 1108 12/06/17 1329 12/27/17 1004  NA 141 143 141  K 3.9 4.1 4.5  CL 106 107 107  CO2 _0 GLUCOSE 94 88 100*  BUN _1 CREATININE 0.75 0.75 0.82  CALCIUM 8.9 8.9 9.1  GFRNONAA >60 >60 >60  GFRAA >60 >60 >60  PROT 6.8 7.4 7.2  ALBUMIN 3.9 4.3 4.3  AST 26 31 38  ALT _2 ALKPHOS 72 75 51  BILITOT 0.9 0.8 0.7    Peripheral blood flow cytometry showed neutrophilia with left shift, less than 1% myeloblasts and basophilia.  CBC and a phenotypic findings suggest a myeloproliferative disorder   # BCR ABL FISH negative. 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 JAK-2 gene mutation   2. Myelofibrosis ([facility])   3. Splenomegaly   4. Weight loss   5. Goals of care, counseling/discussion    # JAK 2 V617 mutation positive, splenomegaly, leukocytosis, constitutional symptoms.  Foundation heme pending high molecular risk mutation status[ ASXL1, SRSF2, [id number], EZH2, IDH1/2] . GIPSS prognostic model: 1 point or more depending on foundation HEME results.  MIPSS70+ v2.0: She has constitutional symptoms which is 2 points, normal karyotype.  Status of high molecular risk mutational unknown Likely intermediate risk or higher. She is doing well on Jakafi.  Leukocytosis has trended down. Constitutional symptoms have improved.  Less symptomatic.  Tolerates well. Continue Jakafi 47m BID.  Anemia is slightly worse, continue to  monitor.   Hypertension emergency: poorly controlled blood pressure,with history of SAH.  Advise patient to go to emergency room for further evaluation.  She voices understanding.   We spent sufficient time to discuss many aspect of care, questions were answered to patient's satisfaction.  Return of visit: 4 weeks.  Orders Placed This Encounter  Procedures  . Comprehensive metabolic panel    Standing Status:   Future    Standing Expiration Date:   12/28/2018  . CBC with Differential/Platelet    Standing Status:   Future    Standing Expiration Date:   12/28/2018   Total face to face encounter time for this patient visit was 25 min. >50% of the time was  spent in counseling and coordination of care.  Earlie Server, MD, PhD Hematology Oncology [facility] Pager- [phone] 12/27/2017

## 2017-12-27 NOTE — Progress Notes (Signed)
Patient here for follow up. Pt with elevated blood pressure, Pt denies dizziness or headache. She has appt with PCP on dec 8 to adress blood pressure issue.

## 2018-01-26 ENCOUNTER — Inpatient Hospital Stay (HOSPITAL_BASED_OUTPATIENT_CLINIC_OR_DEPARTMENT_OTHER): Payer: Medicare Other | Admitting: Oncology

## 2018-01-26 ENCOUNTER — Other Ambulatory Visit: Payer: Self-pay

## 2018-01-26 ENCOUNTER — Inpatient Hospital Stay: Payer: Medicare Other | Attending: Oncology

## 2018-01-26 ENCOUNTER — Encounter: Payer: Self-pay | Admitting: Oncology

## 2018-01-26 VITALS — BP 197/79 | HR 60 | Temp 96.2°F | Resp 18 | Wt 99.3 lb

## 2018-01-26 DIAGNOSIS — R161 Splenomegaly, not elsewhere classified: Secondary | ICD-10-CM | POA: Diagnosis not present

## 2018-01-26 DIAGNOSIS — D649 Anemia, unspecified: Secondary | ICD-10-CM | POA: Diagnosis not present

## 2018-01-26 DIAGNOSIS — Z87891 Personal history of nicotine dependence: Secondary | ICD-10-CM | POA: Insufficient documentation

## 2018-01-26 DIAGNOSIS — I161 Hypertensive emergency: Secondary | ICD-10-CM

## 2018-01-26 DIAGNOSIS — Z7189 Other specified counseling: Secondary | ICD-10-CM

## 2018-01-26 DIAGNOSIS — Z79899 Other long term (current) drug therapy: Secondary | ICD-10-CM

## 2018-01-26 DIAGNOSIS — D7581 Myelofibrosis: Secondary | ICD-10-CM | POA: Insufficient documentation

## 2018-01-26 DIAGNOSIS — Z1589 Genetic susceptibility to other disease: Secondary | ICD-10-CM

## 2018-01-26 DIAGNOSIS — Z7982 Long term (current) use of aspirin: Secondary | ICD-10-CM | POA: Diagnosis not present

## 2018-01-26 LAB — CBC WITH DIFFERENTIAL/PLATELET
ABS IMMATURE GRANULOCYTES: 1.9 10*3/uL — AB (ref 0.00–0.07)
Band Neutrophils: 10 %
Basophils Absolute: 0.6 10*3/uL — ABNORMAL HIGH (ref 0.0–0.1)
Basophils Relative: 5 %
Blasts: 1 %
Eosinophils Absolute: 0 10*3/uL (ref 0.0–0.5)
Eosinophils Relative: 0 %
HCT: 32 % — ABNORMAL LOW (ref 36.0–46.0)
Hemoglobin: 9.4 g/dL — ABNORMAL LOW (ref 12.0–15.0)
LYMPHS ABS: 1.1 10*3/uL (ref 0.7–4.0)
Lymphocytes Relative: 9 %
MCH: 29.2 pg (ref 26.0–34.0)
MCHC: 29.4 g/dL — ABNORMAL LOW (ref 30.0–36.0)
MCV: 99.4 fL (ref 80.0–100.0)
Metamyelocytes Relative: 3 %
Monocytes Absolute: 0.6 10*3/uL (ref 0.1–1.0)
Monocytes Relative: 5 %
Myelocytes: 11 %
Neutro Abs: 8.3 10*3/uL — ABNORMAL HIGH (ref 1.7–7.7)
Neutrophils Relative %: 55 %
Platelets: 222 10*3/uL (ref 150–400)
Promyelocytes Relative: 1 %
RBC: 3.22 MIL/uL — ABNORMAL LOW (ref 3.87–5.11)
RDW: 22.7 % — ABNORMAL HIGH (ref 11.5–15.5)
Smear Review: ADEQUATE
WBC: 12.7 10*3/uL — ABNORMAL HIGH (ref 4.0–10.5)
nRBC: 11.3 % — ABNORMAL HIGH (ref 0.0–0.2)

## 2018-01-26 LAB — COMPREHENSIVE METABOLIC PANEL
ALBUMIN: 4.1 g/dL (ref 3.5–5.0)
ALT: 37 U/L (ref 0–44)
AST: 47 U/L — ABNORMAL HIGH (ref 15–41)
Alkaline Phosphatase: 63 U/L (ref 38–126)
Anion gap: 7 (ref 5–15)
BUN: 22 mg/dL (ref 8–23)
CHLORIDE: 110 mmol/L (ref 98–111)
CO2: 27 mmol/L (ref 22–32)
Calcium: 8.8 mg/dL — ABNORMAL LOW (ref 8.9–10.3)
Creatinine, Ser: 0.81 mg/dL (ref 0.44–1.00)
GFR calc Af Amer: >60 mL/min (ref >60)
GFR calc non Af Amer: >60 mL/min (ref >60)
Glucose, Bld: 100 mg/dL — ABNORMAL HIGH (ref 70–99)
Potassium: 3.8 mmol/L (ref 3.5–5.1)
Sodium: 144 mmol/L (ref 135–145)
Total Bilirubin: 0.8 mg/dL (ref 0.3–1.2)
Total Protein: 6.9 g/dL (ref 6.5–8.1)

## 2018-01-26 NOTE — Progress Notes (Signed)
Patient here for follow up. She has bruising and swelling to 2 fingers on left hand.

## 2018-01-26 NOTE — Progress Notes (Signed)
Hematology/Oncology Follow up note [facility] Telephone:([phone] Fax:(336) [phone]   Patient Care Team: Tracie Harrier, MD as PCP - General (Internal Medicine)  REFERRING PROVIDER: Tracie Harrier, MD  REASON FOR VISIT Follow up for treatment of Leukocytosis and a splenomegaly.   HISTORY OF PRESENTING ILLNESS:  [name] is a  84 y.o.  female with PMH listed below who was referred to me for evaluation of leukocytosis and splenomegaly. Patient was recently admitted to outside facility [facility] in [facility] on Jun 12, 2017 after she developed rectal bleeding.  CT was done during that visit which showed a splenomegaly, spleen enlarged at 14 cm.  There is an indeterminant 1.5 cm right adrenal mass.  Rectal bleeding was considered to be secondary to diverticulitis. Patient follows up with Dr. Ginette Pitman primary care physician. On being 29 2019.  CBC showed white count 14.3, hemoglobin 9.9, MCV 99.4, immature granulocytes 1.2, reticulocyte percentage 7.78 elevated, platelet count 386,000, differential showed neutrophilia, ANC 9.93, basophils 0.47,  Reviewed patient's previous lab work-up.  She has had leukocytosis since October 2018 with similar differential abnormalities. Patient has also reported easy bruising, no episodes of acute bleeding.  She also had weight loss about 12 pounds for the past 2 to 3 months.  Reports feeling fatigued.    Bone marrow biopsy aspirate showed hypercellular bone marrow with features of myeloproliferative neoplasm.  Discussed with Dr. Gari Crown All aspirate material is suboptimal but "biopsy touch imprint in the peripheral blood mostly show features of myeloproliferative neoplasm.  Particular the primary myelofibrosis especially in conjunction with patient's previously documented Jak 2 mutation.  No apparent increasing blast cells is seen.  An iron stain performed on touch imprints showed scattered offering  centroblast.  Letter finding is of uncertain significance in this setting and may represent a dysplastic component.  Cytogenetics shows normal karyotype.  Foundation heme pending high molecular risk mutation status[ ASXL1, SRSF2, A6007029, EZH2, IDH1/2] . GIPSS prognostic model: 1 point or more depending on foundation HEME results.  MIPSS70+ v2.0: She has constitutional symptoms which is 2 points, normal karyotype.  Status of high molecular risk mutational unknown Likely intermediate risk or higher.  INTERVAL HISTORY [name] is a 84 y.o. female who has above history reviewed by me today presents for follow up for primary myelofibrosis.  Daughter [name] accompanied patient to clinic visit today.  Patient takes Jakafi 91m BID. Reports fatigue has improved, appetite has also improved. She eats better and has gained weight.  Has not had any night sweat for 4-5 weeks.  Blood pressure has always been not well controlled. Denies any chest pain, headache, focal weakness.  Blood pressure is slight better than last visit.    Review of Systems  Constitutional: Positive for fatigue. Negative for appetite change, chills and fever.  HENT:   Negative for hearing loss and voice change.   Eyes: Negative for eye problems.  Respiratory: Negative for chest tightness and cough.   Cardiovascular: Negative for chest pain.  Gastrointestinal: Negative for abdominal distention, abdominal pain and blood in stool.  Endocrine: Negative for hot flashes.  Genitourinary: Negative for difficulty urinating and frequency.   Musculoskeletal: Negative for arthralgias.  Skin: Negative for itching and rash.  Neurological: Negative for extremity weakness.  Hematological: Negative for adenopathy.  Psychiatric/Behavioral: Negative for confusion.    MEDICAL HISTORY:  Past Medical History:  Diagnosis Date  . Aneurysm of anterior cerebral artery   . Blindness of left eye  secondary to cataract surgery  complication  . Depression   . Hypertension   . SAH (subarachnoid hemorrhage) ([place]) 02/2006  . Tobacco abuse     SURGICAL HISTORY: Past Surgical History:  Procedure Laterality Date  . ABDOMINAL HYSTERECTOMY    . ANEURYSM COILING    . CATARACT EXTRACTION    . CHOLECYSTECTOMY      SOCIAL HISTORY: Social History   Socioeconomic History  . Marital status: Widowed    Spouse name: Not on file  . Number of children: 1  . Years of education: Not on file  . Highest education level: Not on file  Occupational History  . Not on file  Social Needs  . Financial resource strain: Not hard at all  . Food insecurity:    Worry: Never true    Inability: Not on file  . Transportation needs:    Medical: Patient refused    Non-medical: Patient refused  Tobacco Use  . Smoking status: Former Smoker    Last attempt to quit: 07/31/2015    Years since quitting: 2.4  . Smokeless tobacco: Never Used  Substance and Sexual Activity  . Alcohol use: Yes    Alcohol/week: 1.0 standard drinks    Types: 1 Glasses of wine per week    Comment: once a month  . Drug use: No  . Sexual activity: Not on file  Lifestyle  . Physical activity:    Days per week: Patient refused    Minutes per session: Patient refused  . Stress: Not at all  Relationships  . Social connections:    Talks on phone: Not on file    Gets together: Not on file    Attends religious service: Not on file    Active member of club or organization: Not on file    Attends meetings of clubs or organizations: Not on file    Relationship status: Not on file  . Intimate partner violence:    Fear of current or ex partner: Patient refused    Emotionally abused: Patient refused    Physically abused: Patient refused    Forced sexual activity: Patient refused  Other Topics Concern  . Not on file  Social History Narrative  . Not on file    FAMILY HISTORY: Family History  Problem Relation Age of Onset  . Breast cancer Daughter 64  .  Breast cancer Sister   . Throat cancer Mother   . Lung cancer Father   . Prostate cancer Brother   . Leukemia Brother   . Prostate cancer Brother   . Leukemia Brother     ALLERGIES:  has No Known Allergies.  MEDICATIONS:  Current Outpatient Medications  Medication Sig Dispense Refill  . albuterol (PROVENTIL HFA;VENTOLIN HFA) 108 (90 Base) MCG/ACT inhaler Inhale 2 puffs into the lungs every 6 (six) hours as needed for wheezing or shortness of breath. 1 Inhaler 2  . ALPRAZolam (XANAX) 0.25 MG tablet Take 0.25 mg by mouth daily as needed.    Marland Kitchen amLODipine (NORVASC) 5 MG tablet Take 5 mg by mouth daily.    Marland Kitchen aspirin 81 MG tablet Take 81 mg by mouth daily.    . citalopram (CELEXA) 20 MG tablet Take 20 mg by mouth daily.    . ferrous sulfate 325 (65 FE) MG tablet Take 325 mg by mouth once a week. Take 1 tablet on Sundays.    . fluticasone (FLONASE) 50 MCG/ACT nasal spray Place into the nose.    Marland Kitchen lisinopril (PRINIVIL,ZESTRIL) 40  MG tablet Take 40 mg by mouth daily.    Marland Kitchen omeprazole (PRILOSEC) 20 MG capsule Take 20 mg by mouth every morning.    . ruxolitinib phosphate (JAKAFI) 10 MG tablet Take 1 tablet (10 mg total) by mouth 2 (two) times daily. 60 tablet 2  . Vitamin D, Ergocalciferol, (DRISDOL) 50000 units CAPS capsule Take 50,000 Units by mouth every Sunday.     No current facility-administered medications for this visit.      PHYSICAL EXAMINATION: ECOG PERFORMANCE STATUS: 1 - Symptomatic but completely ambulatory Vitals:   01/26/18 1013  BP: (!) 197/79  Pulse: 60  Resp: 18  Temp: (!) 96.2 F (35.7 C)   Filed Weights   01/26/18 1013  Weight: 99 lb 4.8 oz (45 kg)    Physical Exam Constitutional:      General: She is not in acute distress.    Comments: Cachectic  HENT:     Head: Normocephalic and atraumatic.  Eyes:     General: No scleral icterus.    Conjunctiva/sclera: Conjunctivae normal.     Pupils: Pupils are equal, round, and reactive to light.  Neck:      Musculoskeletal: Normal range of motion and neck supple.  Cardiovascular:     Rate and Rhythm: Normal rate and regular rhythm.     Heart sounds: Normal heart sounds.  Pulmonary:     Effort: Pulmonary effort is normal. No respiratory distress.     Breath sounds: Normal breath sounds. No wheezing or rales.  Chest:     Chest wall: No tenderness.  Abdominal:     General: Bowel sounds are normal. There is no distension.     Palpations: Abdomen is soft. There is no mass.     Tenderness: There is no abdominal tenderness.     Comments: No splenomegaly.   Musculoskeletal: Normal range of motion.        General: No deformity.  Lymphadenopathy:     Cervical: No cervical adenopathy.  Skin:    General: Skin is warm and dry.     Findings: No erythema or rash.  Neurological:     Mental Status: She is alert and oriented to person, place, and time.     Cranial Nerves: No cranial nerve deficit.     Coordination: Coordination normal.  Psychiatric:        Behavior: Behavior normal.        Thought Content: Thought content normal.      LABORATORY DATA:  I have reviewed the data as listed Lab Results  Component Value Date   WBC 12.7 (H) 01/26/2018   HGB 9.4 (L) 01/26/2018   HCT 32.0 (L) 01/26/2018   MCV 99.4 01/26/2018   PLT 222 01/26/2018   Recent Labs    12/06/17 1329 12/27/17 1004 01/26/18 0950  NA 143 141 144  K 4.1 4.5 3.8  CL 107 107 110  CO2 '26 26 27  ' GLUCOSE 88 100* 100*  BUN '15 19 22  ' CREATININE 0.75 0.82 0.81  CALCIUM 8.9 9.1 8.8*  GFRNONAA >60 >60 >60  GFRAA >60 >60 >60  PROT 7.4 7.2 6.9  ALBUMIN 4.3 4.3 4.1  AST 31 38 47*  ALT 15 24 37  ALKPHOS 75 51 63  BILITOT 0.8 0.7 0.8    Peripheral blood flow cytometry showed neutrophilia with left shift, less than 1% myeloblasts and basophilia.  CBC and a phenotypic findings suggest a myeloproliferative disorder   # BCR ABL FISH negative. # Jak 2 V617 mutation  positive  ASSESSMENT & PLAN:  1. JAK-2 gene mutation     2. Myelofibrosis ([place])    # JAK 2 V617 mutation positive, myelofibrosis Tolerates Jakafi 52m BID well.  Constitutional symptoms have improved.  Leukocytosis trending down.   Normocytic anemia, monitor hemoglobin.   Hypertension emergency: poorly controlled blood pressure,with history of SAH. This has been a chronic problem for her. She follows up with PCP. Not willing to go to ER for further management.  On Amlodipine, lisinopril.  We spent sufficient time to discuss many aspect of care, questions were answered to patient's satisfaction.  Return of visit: 4 weeks.  Orders Placed This Encounter  Procedures  . CBC with Differential/Platelet    Standing Status:   Future    Standing Expiration Date:   01/26/2019  . Comprehensive metabolic panel    Standing Status:   Future    Standing Expiration Date:   01/26/2019  . Retic Panel    Standing Status:   Future    Standing Expiration Date:   01/27/2019     ZEarlie Server MD, PhD Hematology Oncology CStroud Regional Medical Centerat ANaugatuck Valley Endoscopy Center LLCPager- [phone]/20/2019

## 2018-02-08 ENCOUNTER — Other Ambulatory Visit: Payer: Self-pay | Admitting: Oncology

## 2018-02-08 DIAGNOSIS — D7581 Myelofibrosis: Secondary | ICD-10-CM

## 2018-02-11 ENCOUNTER — Other Ambulatory Visit: Payer: Self-pay | Admitting: Oncology

## 2018-02-11 DIAGNOSIS — D7581 Myelofibrosis: Secondary | ICD-10-CM

## 2018-02-23 ENCOUNTER — Inpatient Hospital Stay (HOSPITAL_BASED_OUTPATIENT_CLINIC_OR_DEPARTMENT_OTHER): Payer: Medicare Other | Admitting: Oncology

## 2018-02-23 ENCOUNTER — Inpatient Hospital Stay: Payer: Medicare Other | Attending: Oncology

## 2018-02-23 ENCOUNTER — Encounter: Payer: Self-pay | Admitting: Oncology

## 2018-02-23 VITALS — BP 179/64 | HR 58 | Temp 97.9°F | Resp 18 | Wt 101.0 lb

## 2018-02-23 DIAGNOSIS — R161 Splenomegaly, not elsewhere classified: Secondary | ICD-10-CM

## 2018-02-23 DIAGNOSIS — I10 Essential (primary) hypertension: Secondary | ICD-10-CM

## 2018-02-23 DIAGNOSIS — Z7982 Long term (current) use of aspirin: Secondary | ICD-10-CM | POA: Insufficient documentation

## 2018-02-23 DIAGNOSIS — R634 Abnormal weight loss: Secondary | ICD-10-CM | POA: Diagnosis not present

## 2018-02-23 DIAGNOSIS — D649 Anemia, unspecified: Secondary | ICD-10-CM | POA: Diagnosis not present

## 2018-02-23 DIAGNOSIS — Z87891 Personal history of nicotine dependence: Secondary | ICD-10-CM

## 2018-02-23 DIAGNOSIS — Z79899 Other long term (current) drug therapy: Secondary | ICD-10-CM

## 2018-02-23 DIAGNOSIS — D471 Chronic myeloproliferative disease: Secondary | ICD-10-CM | POA: Diagnosis not present

## 2018-02-23 DIAGNOSIS — Z1589 Genetic susceptibility to other disease: Secondary | ICD-10-CM

## 2018-02-23 DIAGNOSIS — D7581 Myelofibrosis: Secondary | ICD-10-CM

## 2018-02-23 LAB — CBC WITH DIFFERENTIAL/PLATELET
ABS IMMATURE GRANULOCYTES: 3.2 10*3/uL — AB (ref 0.00–0.07)
Band Neutrophils: 14 %
Basophils Absolute: 0.4 10*3/uL — ABNORMAL HIGH (ref 0.0–0.1)
Basophils Relative: 3 %
Blasts: 1 %
Eosinophils Absolute: 0.7 10*3/uL — ABNORMAL HIGH (ref 0.0–0.5)
Eosinophils Relative: 5 %
HCT: 32.7 % — ABNORMAL LOW (ref 36.0–46.0)
HEMOGLOBIN: 9.9 g/dL — AB (ref 12.0–15.0)
Lymphocytes Relative: 9 %
Lymphs Abs: 1.2 10*3/uL (ref 0.7–4.0)
MCH: 29.9 pg (ref 26.0–34.0)
MCHC: 30.3 g/dL (ref 30.0–36.0)
MCV: 98.8 fL (ref 80.0–100.0)
Metamyelocytes Relative: 7 %
Monocytes Absolute: 0.5 10*3/uL (ref 0.1–1.0)
Monocytes Relative: 4 %
Myelocytes: 15 %
NEUTROS ABS: 7.2 10*3/uL (ref 1.7–7.7)
NRBC: 9 % — AB (ref 0.0–0.2)
Neutrophils Relative %: 40 %
Platelets: 245 10*3/uL (ref 150–400)
Promyelocytes Relative: 2 %
RBC: 3.31 MIL/uL — ABNORMAL LOW (ref 3.87–5.11)
RDW: 21.2 % — ABNORMAL HIGH (ref 11.5–15.5)
Smear Review: ADEQUATE
WBC: 13.4 10*3/uL — ABNORMAL HIGH (ref 4.0–10.5)

## 2018-02-23 LAB — COMPREHENSIVE METABOLIC PANEL
ALT: 26 U/L (ref 0–44)
AST: 39 U/L (ref 15–41)
Albumin: 4.1 g/dL (ref 3.5–5.0)
Alkaline Phosphatase: 61 U/L (ref 38–126)
Anion gap: 7 (ref 5–15)
BUN: 17 mg/dL (ref 8–23)
CO2: 28 mmol/L (ref 22–32)
Calcium: 9.1 mg/dL (ref 8.9–10.3)
Chloride: 108 mmol/L (ref 98–111)
Creatinine, Ser: 0.75 mg/dL (ref 0.44–1.00)
GFR calc Af Amer: >60 mL/min (ref >60)
GFR calc non Af Amer: >60 mL/min (ref >60)
Glucose, Bld: 99 mg/dL (ref 70–99)
POTASSIUM: 4.1 mmol/L (ref 3.5–5.1)
Sodium: 143 mmol/L (ref 135–145)
Total Bilirubin: 0.8 mg/dL (ref 0.3–1.2)
Total Protein: 7.1 g/dL (ref 6.5–8.1)

## 2018-02-23 LAB — RETIC PANEL
Immature Retic Fract: 39.7 % — ABNORMAL HIGH (ref 2.3–15.9)
RBC.: 3.31 MIL/uL — ABNORMAL LOW (ref 3.87–5.11)
RETIC CT PCT: 4.6 % — AB (ref 0.4–3.1)
Retic Count, Absolute: 150.6 10*3/uL (ref 19.0–186.0)
Reticulocyte Hemoglobin: 30.6 pg (ref >27.9)

## 2018-02-23 NOTE — Progress Notes (Signed)
Hematology/Oncology Follow up note [facility] Telephone:(336) [phone] Fax:(336) [phone]   Patient Care Team: Morgan Harrier, MD as PCP - General (Internal Medicine)  REFERRING PROVIDER: Tracie Harrier, MD  REASON FOR VISIT Follow up for treatment of Leukocytosis and a splenomegaly.   HISTORY OF PRESENTING ILLNESS:  [name] is a  85 y.o.  female with PMH listed below who was referred to me for evaluation of leukocytosis and splenomegaly. Patient was recently admitted to outside facility [facility] in [facility] on Jun 12, 2017 after she developed rectal bleeding.  CT was done during that visit which showed a splenomegaly, spleen enlarged at 14 cm.  There is an indeterminant 1.5 cm right adrenal mass.  Rectal bleeding was considered to be secondary to diverticulitis. Patient follows up with Dr. Ginette Hurley primary care physician. On being 29 2019.  CBC showed white count 14.3, hemoglobin 9.9, MCV 99.4, immature granulocytes 1.2, reticulocyte percentage 7.78 elevated, platelet count 386,000, differential showed neutrophilia, ANC 9.93, basophils 0.47,  Reviewed patient's previous lab work-up.  She has had leukocytosis since October 2018 with similar differential abnormalities. Patient has also reported easy bruising, no episodes of acute bleeding.  She also had weight loss about 12 pounds for the past 2 to 3 months.  Reports feeling fatigued.    Bone marrow biopsy aspirate showed hypercellular bone marrow with features of myeloproliferative neoplasm.  Discussed with Dr. Gari Hurley All aspirate material is suboptimal but "biopsy touch imprint in the peripheral blood mostly show features of myeloproliferative neoplasm.  Particular the primary myelofibrosis especially in conjunction with patient's previously documented Jak 2 mutation.  No apparent increasing blast cells is seen.  An iron stain performed on touch imprints showed scattered offering  centroblast.  Letter finding is of uncertain significance in this setting and may represent a dysplastic component.  Cytogenetics shows normal karyotype.  Foundation heme pending high molecular risk mutation status[ ASXL1, SRSF2, A6007029, EZH2, IDH1/2] . GIPSS prognostic model: 1 point or more depending on foundation HEME results.  MIPSS70+ v2.0: She has constitutional symptoms which is 2 points, normal karyotype.  Status of high molecular risk mutational unknown Likely intermediate risk or higher.  INTERVAL HISTORY [name] is a 85 y.o. female who has above history reviewed by me today presents for follow up for primary myelofibrosis. Daughter [name] accompanied patient to clinic visit today.   Patient takes Jakafi 10 mg twice daily.  Report fatigue has improved significantly.  Appetite has also improved a lot. Patient tells me that " I eat a whole box of chocolate cherry yesterday".  Has gained 2 pounds since her last visit 4 weeks ago.   Patient takes Jakafi 25m BID. Reports fatigue has improved, appetite has also improved. She eats better and has gained weight.  Has not had any night sweat for 4-5 weeks.  Blood pressure has always been not well controlled. Denies any chest pain, headache, focal weakness.  Blood pressure is slight better than last visit.    Review of Systems  Constitutional: Negative for appetite change, chills, fatigue and fever.  HENT:   Negative for hearing loss and voice change.   Eyes: Negative for eye problems.  Respiratory: Negative for chest tightness and cough.   Cardiovascular: Negative for chest pain.  Gastrointestinal: Negative for abdominal distention, abdominal pain and blood in stool.  Endocrine: Negative for hot flashes.  Genitourinary: Negative for difficulty urinating and frequency.   Musculoskeletal: Negative for arthralgias.  Skin: Negative for itching  and rash.  Neurological: Negative for extremity weakness.  Hematological:  Negative for adenopathy.  Psychiatric/Behavioral: Negative for confusion.    MEDICAL HISTORY:  Past Medical History:  Diagnosis Date  . Aneurysm of anterior cerebral artery   . Blindness of left eye    secondary to cataract surgery complication  . Depression   . Hypertension   . SAH (subarachnoid hemorrhage) ([place]) 02/2006  . Tobacco abuse     SURGICAL HISTORY: Past Surgical History:  Procedure Laterality Date  . ABDOMINAL HYSTERECTOMY    . ANEURYSM COILING    . CATARACT EXTRACTION    . CHOLECYSTECTOMY      SOCIAL HISTORY: Social History   Socioeconomic History  . Marital status: Widowed    Spouse name: Not on file  . Number of children: 1  . Years of education: Not on file  . Highest education level: Not on file  Occupational History  . Not on file  Social Needs  . Financial resource strain: Not hard at all  . Food insecurity:    Worry: Never true    Inability: Not on file  . Transportation needs:    Medical: Patient refused    Non-medical: Patient refused  Tobacco Use  . Smoking status: Former Smoker    Last attempt to quit: 07/31/2015    Years since quitting: 2.5  . Smokeless tobacco: Never Used  Substance and Sexual Activity  . Alcohol use: Yes    Alcohol/week: 1.0 standard drinks    Types: 1 Glasses of wine per week    Comment: once a month  . Drug use: No  . Sexual activity: Not on file  Lifestyle  . Physical activity:    Days per week: Patient refused    Minutes per session: Patient refused  . Stress: Not at all  Relationships  . Social connections:    Talks on phone: Not on file    Gets together: Not on file    Attends religious service: Not on file    Active member of club or organization: Not on file    Attends meetings of clubs or organizations: Not on file    Relationship status: Not on file  . Intimate partner violence:    Fear of current or ex partner: Patient refused    Emotionally abused: Patient refused    Physically abused:  Patient refused    Forced sexual activity: Patient refused  Other Topics Concern  . Not on file  Social History Narrative  . Not on file    FAMILY HISTORY: Family History  Problem Relation Age of Onset  . Breast cancer Daughter 65  . Breast cancer Sister   . Throat cancer Mother   . Lung cancer Father   . Prostate cancer Brother   . Leukemia Brother   . Prostate cancer Brother   . Leukemia Brother     ALLERGIES:  has No Known Allergies.  MEDICATIONS:  Current Outpatient Medications  Medication Sig Dispense Refill  . albuterol (PROVENTIL HFA;VENTOLIN HFA) 108 (90 Base) MCG/ACT inhaler Inhale 2 puffs into the lungs every 6 (six) hours as needed for wheezing or shortness of breath. 1 Inhaler 2  . ALPRAZolam (XANAX) 0.25 MG tablet Take 0.25 mg by mouth daily as needed.    Marland Kitchen amLODipine (NORVASC) 5 MG tablet Take 5 mg by mouth daily.    Marland Kitchen aspirin 81 MG tablet Take 81 mg by mouth daily.    . citalopram (CELEXA) 20 MG tablet Take 20 mg by  mouth daily.    . ferrous sulfate 325 (65 FE) MG tablet Take 325 mg by mouth once a week. Take 1 tablet on Sundays.    . fluticasone (FLONASE) 50 MCG/ACT nasal spray Place into the nose.    Marland Kitchen JAKAFI 10 MG tablet TAKE 1 TABLET TWICE A DAY 60 tablet 12  . lisinopril (PRINIVIL,ZESTRIL) 40 MG tablet Take 40 mg by mouth daily.    Marland Kitchen omeprazole (PRILOSEC) 20 MG capsule Take 20 mg by mouth every morning.    . Vitamin D, Ergocalciferol, (DRISDOL) 50000 units CAPS capsule Take 50,000 Units by mouth every Sunday.     No current facility-administered medications for this visit.      PHYSICAL EXAMINATION: ECOG PERFORMANCE STATUS: 1 - Symptomatic but completely ambulatory Vitals:   02/23/18 1403  BP: (!) 179/64  Pulse: (!) 58  Resp: 18  Temp: 97.9 F (36.6 C)  SpO2: 97%   Filed Weights   02/23/18 1403  Weight: 101 lb (45.8 kg)    Physical Exam Constitutional:      General: She is not in acute distress.    Comments: Cachectic  HENT:     Head:  Normocephalic and atraumatic.  Eyes:     General: No scleral icterus.    Conjunctiva/sclera: Conjunctivae normal.     Pupils: Pupils are equal, round, and reactive to light.  Neck:     Musculoskeletal: Normal range of motion and neck supple.  Cardiovascular:     Rate and Rhythm: Normal rate and regular rhythm.     Heart sounds: Normal heart sounds.  Pulmonary:     Effort: Pulmonary effort is normal. No respiratory distress.     Breath sounds: Normal breath sounds. No wheezing or rales.  Chest:     Chest wall: No tenderness.  Abdominal:     General: Bowel sounds are normal. There is no distension.     Palpations: Abdomen is soft. There is no mass.     Tenderness: There is no abdominal tenderness.     Comments: No splenomegaly.   Musculoskeletal: Normal range of motion.        General: No deformity.  Lymphadenopathy:     Cervical: No cervical adenopathy.  Skin:    General: Skin is warm and dry.     Findings: No erythema or rash.  Neurological:     Mental Status: She is alert and oriented to person, place, and time.     Cranial Nerves: No cranial nerve deficit.     Coordination: Coordination normal.  Psychiatric:        Behavior: Behavior normal.        Thought Content: Thought content normal.      LABORATORY DATA:  I have reviewed the data as listed Lab Results  Component Value Date   WBC 13.4 (H) 02/23/2018   HGB 9.9 (L) 02/23/2018   HCT 32.7 (L) 02/23/2018   MCV 98.8 02/23/2018   PLT 245 02/23/2018   Recent Labs    12/27/17 1004 01/26/18 0950 02/23/18 1318  NA 141 144 143  K 4.5 3.8 4.1  CL 107 110 108  CO2 _0 GLUCOSE 100* 100* 99  BUN _1 CREATININE 0.82 0.81 0.75  CALCIUM 9.1 8.8* 9.1  GFRNONAA >60 >60 >60  GFRAA >60 >60 >60  PROT 7.2 6.9 7.1  ALBUMIN 4.3 4.1 4.1  AST 38 47* 39  ALT 24 37 26  ALKPHOS 51 63 61  BILITOT 0.7 0.8 0.8  Peripheral blood flow cytometry showed neutrophilia with left shift, less than 1% myeloblasts and  basophilia.  CBC and a phenotypic findings suggest a myeloproliferative disorder   # BCR ABL FISH negative. 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 Myelofibrosis ([place])   2. JAK-2 gene mutation   3. Uncontrolled hypertension    # JAK 2 V617 mutation positive, myelofibrosis Tolerates Jakafi treatments well.  Constitutional symptoms have largely improved.  Continue Jakafi at 25m BID.   # Normocytic anemia, hemoglobin stable. Continue to monitor.   # Uncontrolled hypertension, better controlled BP comparing to her previous visits.  On Amlodipine, lisinopril.  We spent sufficient time to discuss many aspect of care, questions were answered to patient's satisfaction.  Return of visit: 4 weeks.  Orders Placed This Encounter  Procedures  . CBC with Differential/Platelet    Standing Status:   Future    Standing Expiration Date:   02/23/2019  . Comprehensive metabolic panel    Standing Status:   Future    Standing Expiration Date:   02/23/2019     ZEarlie Server MD, PhD Hematology Oncology CPalo Verde Behavioral Healthat ASouth Jordan Health CenterPager- [phone]/17/2020

## 2018-02-23 NOTE — Progress Notes (Signed)
Pt in for follow up, reports started another new blood pressure med since last seen in clinic.

## 2018-02-25 DIAGNOSIS — I10 Essential (primary) hypertension: Secondary | ICD-10-CM | POA: Insufficient documentation

## 2018-02-25 DIAGNOSIS — D7581 Myelofibrosis: Secondary | ICD-10-CM | POA: Insufficient documentation

## 2018-02-25 DIAGNOSIS — Z1589 Genetic susceptibility to other disease: Secondary | ICD-10-CM | POA: Insufficient documentation

## 2018-03-23 ENCOUNTER — Inpatient Hospital Stay (HOSPITAL_BASED_OUTPATIENT_CLINIC_OR_DEPARTMENT_OTHER): Payer: Medicare Other | Admitting: Oncology

## 2018-03-23 ENCOUNTER — Encounter: Payer: Self-pay | Admitting: Oncology

## 2018-03-23 ENCOUNTER — Other Ambulatory Visit: Payer: Self-pay

## 2018-03-23 ENCOUNTER — Inpatient Hospital Stay: Payer: Medicare Other | Attending: Oncology

## 2018-03-23 VITALS — BP 151/72 | HR 64 | Temp 96.3°F | Resp 18 | Wt 105.0 lb

## 2018-03-23 DIAGNOSIS — Z87891 Personal history of nicotine dependence: Secondary | ICD-10-CM | POA: Insufficient documentation

## 2018-03-23 DIAGNOSIS — D7581 Myelofibrosis: Secondary | ICD-10-CM | POA: Diagnosis not present

## 2018-03-23 DIAGNOSIS — D72821 Monocytosis (symptomatic): Secondary | ICD-10-CM | POA: Insufficient documentation

## 2018-03-23 DIAGNOSIS — D649 Anemia, unspecified: Secondary | ICD-10-CM

## 2018-03-23 DIAGNOSIS — Z1589 Genetic susceptibility to other disease: Secondary | ICD-10-CM

## 2018-03-23 DIAGNOSIS — R161 Splenomegaly, not elsewhere classified: Secondary | ICD-10-CM | POA: Insufficient documentation

## 2018-03-23 DIAGNOSIS — Z7982 Long term (current) use of aspirin: Secondary | ICD-10-CM | POA: Diagnosis not present

## 2018-03-23 DIAGNOSIS — I10 Essential (primary) hypertension: Secondary | ICD-10-CM | POA: Diagnosis not present

## 2018-03-23 DIAGNOSIS — D72829 Elevated white blood cell count, unspecified: Secondary | ICD-10-CM

## 2018-03-23 DIAGNOSIS — Z79899 Other long term (current) drug therapy: Secondary | ICD-10-CM | POA: Diagnosis not present

## 2018-03-23 LAB — CBC WITH DIFFERENTIAL/PLATELET
Abs Immature Granulocytes: 3.98 10*3/uL — ABNORMAL HIGH (ref 0.00–0.07)
Basophils Absolute: 0.7 10*3/uL — ABNORMAL HIGH (ref 0.0–0.1)
Basophils Relative: 4 %
Eosinophils Absolute: 0.2 10*3/uL (ref 0.0–0.5)
Eosinophils Relative: 1 %
HCT: 32.4 % — ABNORMAL LOW (ref 36.0–46.0)
Hemoglobin: 9.5 g/dL — ABNORMAL LOW (ref 12.0–15.0)
Immature Granulocytes: 24 %
Lymphocytes Relative: 10 %
Lymphs Abs: 1.7 10*3/uL (ref 0.7–4.0)
MCH: 29.8 pg (ref 26.0–34.0)
MCHC: 29.3 g/dL — ABNORMAL LOW (ref 30.0–36.0)
MCV: 101.6 fL — ABNORMAL HIGH (ref 80.0–100.0)
MONO ABS: 1.4 10*3/uL — AB (ref 0.1–1.0)
Monocytes Relative: 8 %
Neutro Abs: 8.9 10*3/uL — ABNORMAL HIGH (ref 1.7–7.7)
Neutrophils Relative %: 53 %
Platelets: 229 10*3/uL (ref 150–400)
RBC: 3.19 MIL/uL — ABNORMAL LOW (ref 3.87–5.11)
RDW: 21.3 % — AB (ref 11.5–15.5)
WBC: 16.8 10*3/uL — AB (ref 4.0–10.5)
nRBC: 14.2 % — ABNORMAL HIGH (ref 0.0–0.2)

## 2018-03-23 LAB — COMPREHENSIVE METABOLIC PANEL
ALT: 27 U/L (ref 0–44)
AST: 40 U/L (ref 15–41)
Albumin: 4 g/dL (ref 3.5–5.0)
Alkaline Phosphatase: 50 U/L (ref 38–126)
Anion gap: 6 (ref 5–15)
BUN: 17 mg/dL (ref 8–23)
CO2: 28 mmol/L (ref 22–32)
Calcium: 9 mg/dL (ref 8.9–10.3)
Chloride: 107 mmol/L (ref 98–111)
Creatinine, Ser: 0.8 mg/dL (ref 0.44–1.00)
GFR calc Af Amer: >60 mL/min (ref >60)
GFR calc non Af Amer: >60 mL/min (ref >60)
Glucose, Bld: 83 mg/dL (ref 70–99)
Potassium: 3.8 mmol/L (ref 3.5–5.1)
Sodium: 141 mmol/L (ref 135–145)
Total Bilirubin: 0.9 mg/dL (ref 0.3–1.2)
Total Protein: 7.1 g/dL (ref 6.5–8.1)

## 2018-03-23 NOTE — Progress Notes (Signed)
Patient here for follow up. No concerns voiced.  °

## 2018-03-25 NOTE — Progress Notes (Signed)
Hematology/Oncology Follow up note [facility] Telephone:([phone]) [phone] Fax:(336) [phone]   Patient Care Team: Tracie Harrier, MD as PCP - General (Internal Medicine)  REFERRING PROVIDER: Tracie Harrier, MD  REASON FOR VISIT Follow up for treatment of Leukocytosis and a splenomegaly.   HISTORY OF PRESENTING ILLNESS:  [name] is a  85 y.o.  female with PMH listed below who was referred to me for evaluation of leukocytosis and splenomegaly. Patient was recently admitted to outside facility [facility] in [facility] on Jun 12, 2017 after she developed rectal bleeding.  CT was done during that visit which showed a splenomegaly, spleen enlarged at 14 cm.  There is an indeterminant 1.5 cm right adrenal mass.  Rectal bleeding was considered to be secondary to diverticulitis. Patient follows up with Dr. Ginette Pitman primary care physician. On being 29 2019.  CBC showed white count 14.3, hemoglobin 9.9, MCV 99.4, immature granulocytes 1.2, reticulocyte percentage 7.78 elevated, platelet count 386,000, differential showed neutrophilia, ANC 9.93, basophils 0.47,  Reviewed patient's previous lab work-up.  She has had leukocytosis since October 2018 with similar differential abnormalities. Patient has also reported easy bruising, no episodes of acute bleeding.  She also had weight loss about 12 pounds for the past 2 to 3 months.  Reports feeling fatigued.    Bone marrow biopsy aspirate showed hypercellular bone marrow with features of myeloproliferative neoplasm.  Discussed with Dr. Gari Crown All aspirate material is suboptimal but "biopsy touch imprint in the peripheral blood mostly show features of myeloproliferative neoplasm.  Particular the primary myelofibrosis especially in conjunction with patient's previously documented Jak 2 mutation.  No apparent increasing blast cells is seen.  An iron stain performed on touch imprints showed scattered offering  centroblast.  Letter finding is of uncertain significance in this setting and may represent a dysplastic component.  Cytogenetics shows normal karyotype.  Foundation heme pending high molecular risk mutation status[ ASXL1, SRSF2, [id number], EZH2, IDH1/2] . GIPSS prognostic model: 1 point or more depending on foundation HEME results.  MIPSS70+ v2.0: She has constitutional symptoms which is 2 points, normal karyotype.  Status of high molecular risk mutational unknown Likely intermediate risk or higher.  INTERVAL HISTORY [name] is a 85 y.o. female who has above history reviewed by me today presents for follow up for primary myelofibrosis. Daughter [name] accompanied patient to clinic visit today.  Patient takes Jakafi 10 mg nightly.  Report fatigue has significantly improved. Appetite remains very well " snacks all the time". Patient has gained 4 pounds since last visit. Blood pressure is slight better than last visit.    Review of Systems  Constitutional: Negative for appetite change, chills, fatigue and fever.  HENT:   Negative for hearing loss and voice change.   Eyes: Negative for eye problems.  Respiratory: Negative for chest tightness and cough.   Cardiovascular: Negative for chest pain.  Gastrointestinal: Negative for abdominal distention, abdominal pain and blood in stool.  Endocrine: Negative for hot flashes.  Genitourinary: Negative for difficulty urinating and frequency.   Musculoskeletal: Negative for arthralgias.  Skin: Negative for itching and rash.  Neurological: Negative for extremity weakness.  Hematological: Negative for adenopathy.  Psychiatric/Behavioral: Negative for confusion.    MEDICAL HISTORY:  Past Medical History:  Diagnosis Date  . Aneurysm of anterior cerebral artery   . Blindness of left eye    secondary to cataract surgery complication  . Depression   . Hypertension   . SAH (subarachnoid hemorrhage) ([place]) 02/2006  .  Tobacco abuse      SURGICAL HISTORY: Past Surgical History:  Procedure Laterality Date  . ABDOMINAL HYSTERECTOMY    . ANEURYSM COILING    . CATARACT EXTRACTION    . CHOLECYSTECTOMY      SOCIAL HISTORY: Social History   Socioeconomic History  . Marital status: Widowed    Spouse name: Not on file  . Number of children: 1  . Years of education: Not on file  . Highest education level: Not on file  Occupational History  . Not on file  Social Needs  . Financial resource strain: Not hard at all  . Food insecurity:    Worry: Never true    Inability: Not on file  . Transportation needs:    Medical: Patient refused    Non-medical: Patient refused  Tobacco Use  . Smoking status: Former Smoker    Last attempt to quit: 07/31/2015    Years since quitting: 2.6  . Smokeless tobacco: Never Used  Substance and Sexual Activity  . Alcohol use: Yes    Alcohol/week: 1.0 standard drinks    Types: 1 Glasses of wine per week    Comment: once a month  . Drug use: No  . Sexual activity: Not on file  Lifestyle  . Physical activity:    Days per week: Patient refused    Minutes per session: Patient refused  . Stress: Not at all  Relationships  . Social connections:    Talks on phone: Not on file    Gets together: Not on file    Attends religious service: Not on file    Active member of club or organization: Not on file    Attends meetings of clubs or organizations: Not on file    Relationship status: Not on file  . Intimate partner violence:    Fear of current or ex partner: Patient refused    Emotionally abused: Patient refused    Physically abused: Patient refused    Forced sexual activity: Patient refused  Other Topics Concern  . Not on file  Social History Narrative  . Not on file    FAMILY HISTORY: Family History  Problem Relation Age of Onset  . Breast cancer Daughter 22  . Breast cancer Sister   . Throat cancer Mother   . Lung cancer Father   . Prostate cancer Brother   . Leukemia  Brother   . Prostate cancer Brother   . Leukemia Brother     ALLERGIES:  has No Known Allergies.  MEDICATIONS:  Current Outpatient Medications  Medication Sig Dispense Refill  . albuterol (PROVENTIL HFA;VENTOLIN HFA) 108 (90 Base) MCG/ACT inhaler Inhale 2 puffs into the lungs every 6 (six) hours as needed for wheezing or shortness of breath. 1 Inhaler 2  . ALPRAZolam (XANAX) 0.25 MG tablet Take 0.25 mg by mouth daily as needed.    Marland Kitchen amLODipine (NORVASC) 5 MG tablet Take 5 mg by mouth daily.    Marland Kitchen aspirin 81 MG tablet Take 81 mg by mouth daily.    . citalopram (CELEXA) 20 MG tablet Take 20 mg by mouth daily.    . ferrous sulfate 325 (65 FE) MG tablet Take 325 mg by mouth once a week. Take 1 tablet on Sundays.    . fluticasone (FLONASE) 50 MCG/ACT nasal spray Place into the nose.    Marland Kitchen JAKAFI 10 MG tablet TAKE 1 TABLET TWICE A DAY 60 tablet 12  . lisinopril (PRINIVIL,ZESTRIL) 40 MG tablet Take 40 mg by mouth  daily.    . omeprazole (PRILOSEC) 20 MG capsule Take 20 mg by mouth every morning.    . Vitamin D, Ergocalciferol, (DRISDOL) 50000 units CAPS capsule Take 50,000 Units by mouth every Sunday.     No current facility-administered medications for this visit.      PHYSICAL EXAMINATION: ECOG PERFORMANCE STATUS: 1 - Symptomatic but completely ambulatory Vitals:   03/23/18 1419  BP: (!) 151/72  Pulse: 64  Resp: 18  Temp: (!) 96.3 F (35.7 C)   Filed Weights   03/23/18 1419  Weight: 105 lb (47.6 kg)    Physical Exam Constitutional:      General: She is not in acute distress.    Comments: Cachectic  HENT:     Head: Normocephalic and atraumatic.  Eyes:     General: No scleral icterus.    Conjunctiva/sclera: Conjunctivae normal.     Pupils: Pupils are equal, round, and reactive to light.  Neck:     Musculoskeletal: Normal range of motion and neck supple.  Cardiovascular:     Rate and Rhythm: Normal rate and regular rhythm.     Heart sounds: Normal heart sounds.    Pulmonary:     Effort: Pulmonary effort is normal. No respiratory distress.     Breath sounds: Normal breath sounds. No wheezing or rales.  Chest:     Chest wall: No tenderness.  Abdominal:     General: Bowel sounds are normal. There is no distension.     Palpations: Abdomen is soft. There is no mass.     Tenderness: There is no abdominal tenderness.     Comments: No splenomegaly.   Musculoskeletal: Normal range of motion.        General: No deformity.  Lymphadenopathy:     Cervical: No cervical adenopathy.  Skin:    General: Skin is warm and dry.     Findings: No erythema or rash.  Neurological:     Mental Status: She is alert and oriented to person, place, and time.     Cranial Nerves: No cranial nerve deficit.     Coordination: Coordination normal.  Psychiatric:        Behavior: Behavior normal.        Thought Content: Thought content normal.      LABORATORY DATA:  I have reviewed the data as listed Lab Results  Component Value Date   WBC 16.8 (H) 03/23/2018   HGB 9.5 (L) 03/23/2018   HCT 32.4 (L) 03/23/2018   MCV 101.6 (H) 03/23/2018   PLT 229 03/23/2018   Recent Labs    01/26/18 0950 02/23/18 1318 03/23/18 1346  NA 144 143 141  K 3.8 4.1 3.8  CL 110 108 107  CO2 _0 GLUCOSE 100* 99 83  BUN _1 CREATININE 0.81 0.75 0.80  CALCIUM 8.8* 9.1 9.0  GFRNONAA >60 >60 >60  GFRAA >60 >60 >60  PROT 6.9 7.1 7.1  ALBUMIN 4.1 4.1 4.0  AST 47* 39 40  ALT 37 26 27  ALKPHOS 63 61 50  BILITOT 0.8 0.8 0.9    Peripheral blood flow cytometry showed neutrophilia with left shift, less than 1% myeloblasts and basophilia.  CBC and a phenotypic findings suggest a myeloproliferative disorder   # BCR ABL FISH negative. 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 JAK-2 gene mutation   2. Myelofibrosis ([place])    # JAK 2 V617 mutation positive, myelofibrosis , Unspecified feeling very well.  Constitutional symptoms have largely improved.  That is  well.  She is gaining weight.  Continue Decadron 10 mg twice daily.  #Leukocytosis with predominant neutrophilia, monocytosis and basophilia.  Continue close monitoring.  # Normocytic anemia, hemoglobin stable.  #Blood pressure has been better controlled.  Continue monitor.   We spent sufficient time to discuss many aspect of care, questions were answered to patient's satisfaction. 3 months for MD assessment and repeat CBC and CMP..  Orders Placed This Encounter  Procedures  . Comprehensive metabolic panel    Standing Status:   Standing    Number of Occurrences:   6    Standing Expiration Date:   03/24/2019  . CBC with Differential/Platelet    Standing Status:   Standing    Number of Occurrences:   6    Standing Expiration Date:   03/24/2019     Earlie Server, MD, PhD Hematology Oncology [facility] Pager- [phone] 03/25/2018

## 2018-04-19 ENCOUNTER — Other Ambulatory Visit: Payer: Self-pay

## 2018-04-19 ENCOUNTER — Inpatient Hospital Stay: Payer: Medicare Other | Attending: Oncology

## 2018-04-19 DIAGNOSIS — D649 Anemia, unspecified: Secondary | ICD-10-CM | POA: Diagnosis not present

## 2018-04-19 DIAGNOSIS — D7581 Myelofibrosis: Secondary | ICD-10-CM | POA: Insufficient documentation

## 2018-04-19 DIAGNOSIS — D72829 Elevated white blood cell count, unspecified: Secondary | ICD-10-CM | POA: Insufficient documentation

## 2018-04-19 DIAGNOSIS — Z1589 Genetic susceptibility to other disease: Secondary | ICD-10-CM | POA: Insufficient documentation

## 2018-04-19 LAB — COMPREHENSIVE METABOLIC PANEL
ALT: 21 U/L (ref 0–44)
AST: 33 U/L (ref 15–41)
Albumin: 4.1 g/dL (ref 3.5–5.0)
Alkaline Phosphatase: 59 U/L (ref 38–126)
Anion gap: 8 (ref 5–15)
BUN: 17 mg/dL (ref 8–23)
CO2: 25 mmol/L (ref 22–32)
Calcium: 8.7 mg/dL — ABNORMAL LOW (ref 8.9–10.3)
Chloride: 110 mmol/L (ref 98–111)
Creatinine, Ser: 0.95 mg/dL (ref 0.44–1.00)
GFR calc Af Amer: >60 mL/min (ref >60)
GFR calc non Af Amer: 54 mL/min — ABNORMAL LOW (ref >60)
Glucose, Bld: 175 mg/dL — ABNORMAL HIGH (ref 70–99)
POTASSIUM: 3.9 mmol/L (ref 3.5–5.1)
Sodium: 143 mmol/L (ref 135–145)
TOTAL PROTEIN: 6.6 g/dL (ref 6.5–8.1)
Total Bilirubin: 0.7 mg/dL (ref 0.3–1.2)

## 2018-04-19 LAB — CBC WITH DIFFERENTIAL/PLATELET
Abs Immature Granulocytes: 2.8 10*3/uL — ABNORMAL HIGH (ref 0.00–0.07)
BASOS ABS: 0 10*3/uL (ref 0.0–0.1)
BASOS PCT: 0 %
Band Neutrophils: 10 %
Blasts: 2 %
Eosinophils Absolute: 0 10*3/uL (ref 0.0–0.5)
Eosinophils Relative: 0 %
HCT: 30.9 % — ABNORMAL LOW (ref 36.0–46.0)
Hemoglobin: 9.4 g/dL — ABNORMAL LOW (ref 12.0–15.0)
Lymphocytes Relative: 10 %
Lymphs Abs: 1.3 10*3/uL (ref 0.7–4.0)
MCH: 30.8 pg (ref 26.0–34.0)
MCHC: 30.4 g/dL (ref 30.0–36.0)
MCV: 101.3 fL — AB (ref 80.0–100.0)
Metamyelocytes Relative: 16 %
Monocytes Absolute: 1.1 10*3/uL — ABNORMAL HIGH (ref 0.1–1.0)
Monocytes Relative: 8 %
Myelocytes: 5 %
NEUTROS ABS: 7.8 10*3/uL — AB (ref 1.7–7.7)
NRBC: 16 % — AB (ref 0.0–0.2)
Neutrophils Relative %: 49 %
Platelets: 257 10*3/uL (ref 150–400)
RBC: 3.05 MIL/uL — ABNORMAL LOW (ref 3.87–5.11)
RDW: 21.1 % — ABNORMAL HIGH (ref 11.5–15.5)
Smear Review: 2
WBC: 13.3 10*3/uL — ABNORMAL HIGH (ref 4.0–10.5)

## 2018-05-16 ENCOUNTER — Other Ambulatory Visit: Payer: Self-pay

## 2018-05-17 ENCOUNTER — Other Ambulatory Visit: Payer: Self-pay

## 2018-05-17 ENCOUNTER — Inpatient Hospital Stay: Payer: Medicare Other | Attending: Oncology

## 2018-05-17 DIAGNOSIS — Z1589 Genetic susceptibility to other disease: Secondary | ICD-10-CM | POA: Diagnosis not present

## 2018-05-17 LAB — CBC WITH DIFFERENTIAL/PLATELET
Abs Immature Granulocytes: 2.8 10*3/uL — ABNORMAL HIGH (ref 0.00–0.07)
Band Neutrophils: 11 %
Basophils Absolute: 0.5 10*3/uL — ABNORMAL HIGH (ref 0.0–0.1)
Basophils Relative: 4 %
Blasts: 3 %
Eosinophils Absolute: 0 10*3/uL (ref 0.0–0.5)
Eosinophils Relative: 0 %
HCT: 33.9 % — ABNORMAL LOW (ref 36.0–46.0)
Hemoglobin: 10.3 g/dL — ABNORMAL LOW (ref 12.0–15.0)
Lymphocytes Relative: 17 %
Lymphs Abs: 2.3 10*3/uL (ref 0.7–4.0)
MCH: 30.1 pg (ref 26.0–34.0)
MCHC: 30.4 g/dL (ref 30.0–36.0)
MCV: 99.1 fL (ref 80.0–100.0)
Metamyelocytes Relative: 10 %
Monocytes Absolute: 0.3 10*3/uL (ref 0.1–1.0)
Monocytes Relative: 2 %
Myelocytes: 9 %
Neutro Abs: 7 10*3/uL (ref 1.7–7.7)
Neutrophils Relative %: 42 %
Platelets: 269 10*3/uL (ref 150–400)
Promyelocytes Relative: 2 %
RBC: 3.42 MIL/uL — ABNORMAL LOW (ref 3.87–5.11)
RDW: 20.2 % — ABNORMAL HIGH (ref 11.5–15.5)
Smear Review: ADEQUATE
WBC: 13.3 10*3/uL — ABNORMAL HIGH (ref 4.0–10.5)
nRBC: 11.2 % — ABNORMAL HIGH (ref 0.0–0.2)

## 2018-05-17 LAB — COMPREHENSIVE METABOLIC PANEL
ALT: 23 U/L (ref 0–44)
AST: 35 U/L (ref 15–41)
Albumin: 4.2 g/dL (ref 3.5–5.0)
Alkaline Phosphatase: 52 U/L (ref 38–126)
Anion gap: 9 (ref 5–15)
BUN: 21 mg/dL (ref 8–23)
CO2: 24 mmol/L (ref 22–32)
Calcium: 8.9 mg/dL (ref 8.9–10.3)
Chloride: 109 mmol/L (ref 98–111)
Creatinine, Ser: 1.01 mg/dL — ABNORMAL HIGH (ref 0.44–1.00)
GFR calc Af Amer: 58 mL/min — ABNORMAL LOW (ref >60)
GFR calc non Af Amer: 50 mL/min — ABNORMAL LOW (ref >60)
Glucose, Bld: 134 mg/dL — ABNORMAL HIGH (ref 70–99)
Potassium: 4 mmol/L (ref 3.5–5.1)
Sodium: 142 mmol/L (ref 135–145)
Total Bilirubin: 0.6 mg/dL (ref 0.3–1.2)
Total Protein: 6.8 g/dL (ref 6.5–8.1)

## 2018-06-17 ENCOUNTER — Other Ambulatory Visit: Payer: Self-pay

## 2018-06-18 ENCOUNTER — Inpatient Hospital Stay: Payer: Medicare Other

## 2018-06-19 ENCOUNTER — Inpatient Hospital Stay: Payer: Medicare Other

## 2018-06-19 ENCOUNTER — Inpatient Hospital Stay: Payer: Medicare Other | Admitting: Oncology

## 2018-06-19 ENCOUNTER — Other Ambulatory Visit: Payer: Self-pay

## 2018-06-20 ENCOUNTER — Inpatient Hospital Stay: Payer: Medicare Other | Attending: Oncology | Admitting: *Deleted

## 2018-06-20 ENCOUNTER — Other Ambulatory Visit: Payer: Self-pay

## 2018-06-20 DIAGNOSIS — Z1589 Genetic susceptibility to other disease: Secondary | ICD-10-CM

## 2018-06-20 LAB — COMPREHENSIVE METABOLIC PANEL
ALT: 23 U/L (ref 0–44)
AST: 36 U/L (ref 15–41)
Albumin: 4.1 g/dL (ref 3.5–5.0)
Alkaline Phosphatase: 56 U/L (ref 38–126)
Anion gap: 9 (ref 5–15)
BUN: 17 mg/dL (ref 8–23)
CO2: 26 mmol/L (ref 22–32)
Calcium: 8.7 mg/dL — ABNORMAL LOW (ref 8.9–10.3)
Chloride: 109 mmol/L (ref 98–111)
Creatinine, Ser: 0.88 mg/dL (ref 0.44–1.00)
GFR calc Af Amer: >60 mL/min (ref >60)
GFR calc non Af Amer: 59 mL/min — ABNORMAL LOW (ref >60)
Glucose, Bld: 106 mg/dL — ABNORMAL HIGH (ref 70–99)
Potassium: 3.8 mmol/L (ref 3.5–5.1)
Sodium: 144 mmol/L (ref 135–145)
Total Bilirubin: 0.6 mg/dL (ref 0.3–1.2)
Total Protein: 7 g/dL (ref 6.5–8.1)

## 2018-06-20 LAB — CBC WITH DIFFERENTIAL/PLATELET
Abs Immature Granulocytes: 3.5 10*3/uL — ABNORMAL HIGH (ref 0.00–0.07)
Band Neutrophils: 6 %
Basophils Absolute: 0.7 10*3/uL — ABNORMAL HIGH (ref 0.0–0.1)
Basophils Relative: 5 %
Blasts: 2 %
Eosinophils Absolute: 0.4 10*3/uL (ref 0.0–0.5)
Eosinophils Relative: 3 %
HCT: 33.8 % — ABNORMAL LOW (ref 36.0–46.0)
Hemoglobin: 10.3 g/dL — ABNORMAL LOW (ref 12.0–15.0)
Lymphocytes Relative: 14 %
Lymphs Abs: 1.9 10*3/uL (ref 0.7–4.0)
MCH: 30 pg (ref 26.0–34.0)
MCHC: 30.5 g/dL (ref 30.0–36.0)
MCV: 98.5 fL (ref 80.0–100.0)
Metamyelocytes Relative: 16 %
Monocytes Absolute: 0.1 10*3/uL (ref 0.1–1.0)
Monocytes Relative: 1 %
Myelocytes: 9 %
Neutro Abs: 6.7 10*3/uL (ref 1.7–7.7)
Neutrophils Relative %: 43 %
Platelets: 260 10*3/uL (ref 150–400)
Promyelocytes Relative: 1 %
RBC: 3.43 MIL/uL — ABNORMAL LOW (ref 3.87–5.11)
RDW: 20.3 % — ABNORMAL HIGH (ref 11.5–15.5)
Smear Review: NORMAL
WBC: 13.6 10*3/uL — ABNORMAL HIGH (ref 4.0–10.5)
nRBC: 9.7 % — ABNORMAL HIGH (ref 0.0–0.2)

## 2018-06-21 ENCOUNTER — Inpatient Hospital Stay (HOSPITAL_BASED_OUTPATIENT_CLINIC_OR_DEPARTMENT_OTHER): Payer: Medicare Other | Admitting: Oncology

## 2018-06-21 ENCOUNTER — Encounter: Payer: Self-pay | Admitting: Oncology

## 2018-06-21 DIAGNOSIS — Z79899 Other long term (current) drug therapy: Secondary | ICD-10-CM

## 2018-06-21 DIAGNOSIS — Z7982 Long term (current) use of aspirin: Secondary | ICD-10-CM

## 2018-06-21 DIAGNOSIS — Z87891 Personal history of nicotine dependence: Secondary | ICD-10-CM | POA: Diagnosis not present

## 2018-06-21 DIAGNOSIS — Z1589 Genetic susceptibility to other disease: Secondary | ICD-10-CM | POA: Diagnosis not present

## 2018-06-21 DIAGNOSIS — D7581 Myelofibrosis: Secondary | ICD-10-CM | POA: Diagnosis not present

## 2018-06-21 DIAGNOSIS — D649 Anemia, unspecified: Secondary | ICD-10-CM | POA: Diagnosis not present

## 2018-06-21 NOTE — Progress Notes (Signed)
HEMATOLOGY-ONCOLOGY TeleHEALTH VISIT PROGRESS NOTE  I connected with [name] on 06/21/18 at 11:00 AM EDT by video enabled telemedicine visit and verified that I am speaking with the correct person using two identifiers. I discussed the limitations, risks, security and privacy concerns of performing an evaluation and management service by telemedicine and the availability of in-person appointments. I also discussed with the patient that there may be a patient responsible charge related to this service. The patient expressed understanding and agreed to proceed.   Other persons participating in the visit and their role in the encounter:  Janeann Merl, RN, check in patient.   Patient's location: Home  Provider's location: Home office Chief Complaint: Follow-up for management of primary myelofibrosis   INTERVAL HISTORY [name] is a 85 y.o. female who has above history reviewed by me today presents for follow up visit for management of primary myelofibrosis. Problems and complaints are listed below:  Patient reports doing well at baseline. Constitutional symptoms have largely improved.  She has very good appetite and has continued gaining weight. Patient is taking Jakafi 10 mg twice daily.  Tolerates well. No new complaints today. Review of Systems  Constitutional: Negative for appetite change, chills, fatigue and fever.  HENT:   Negative for hearing loss and voice change.   Eyes: Negative for eye problems.  Respiratory: Negative for chest tightness and cough.   Cardiovascular: Negative for chest pain.  Gastrointestinal: Negative for abdominal distention, abdominal pain and blood in stool.  Endocrine: Negative for hot flashes.  Genitourinary: Negative for difficulty urinating and frequency.   Musculoskeletal: Negative for arthralgias.  Skin: Negative for itching and rash.  Neurological: Negative for extremity weakness.  Hematological: Negative for adenopathy.   Psychiatric/Behavioral: Negative for confusion.    Past Medical History:  Diagnosis Date  . Aneurysm of anterior cerebral artery   . Blindness of left eye    secondary to cataract surgery complication  . Depression   . Hypertension   . SAH (subarachnoid hemorrhage) ([place]) 02/2006  . Tobacco abuse    Past Surgical History:  Procedure Laterality Date  . ABDOMINAL HYSTERECTOMY    . ANEURYSM COILING    . CATARACT EXTRACTION    . CHOLECYSTECTOMY      Family History  Problem Relation Age of Onset  . Breast cancer Daughter 62  . Breast cancer Sister   . Throat cancer Mother   . Lung cancer Father   . Prostate cancer Brother   . Leukemia Brother   . Prostate cancer Brother   . Leukemia Brother     Social History   Socioeconomic History  . Marital status: Widowed    Spouse name: Not on file  . Number of children: 1  . Years of education: Not on file  . Highest education level: Not on file  Occupational History  . Not on file  Social Needs  . Financial resource strain: Not hard at all  . Food insecurity:    Worry: Never true    Inability: Not on file  . Transportation needs:    Medical: Patient refused    Non-medical: Patient refused  Tobacco Use  . Smoking status: Former Smoker    Last attempt to quit: 07/31/2015    Years since quitting: 2.8  . Smokeless tobacco: Never Used  Substance and Sexual Activity  . Alcohol use: Yes    Alcohol/week: 1.0 standard drinks    Types: 1 Glasses of wine per week    Comment: once  a month  . Drug use: No  . Sexual activity: Not on file  Lifestyle  . Physical activity:    Days per week: Patient refused    Minutes per session: Patient refused  . Stress: Not at all  Relationships  . Social connections:    Talks on phone: Not on file    Gets together: Not on file    Attends religious service: Not on file    Active member of club or organization: Not on file    Attends meetings of clubs or organizations: Not on file     Relationship status: Not on file  . Intimate partner violence:    Fear of current or ex partner: Patient refused    Emotionally abused: Patient refused    Physically abused: Patient refused    Forced sexual activity: Patient refused  Other Topics Concern  . Not on file  Social History Narrative  . Not on file    Current Outpatient Medications on File Prior to Visit  Medication Sig Dispense Refill  . albuterol (PROVENTIL HFA;VENTOLIN HFA) 108 (90 Base) MCG/ACT inhaler Inhale 2 puffs into the lungs every 6 (six) hours as needed for wheezing or shortness of breath. 1 Inhaler 2  . ALPRAZolam (XANAX) 0.25 MG tablet Take 0.25 mg by mouth daily as needed.    Marland Kitchen amLODipine (NORVASC) 5 MG tablet Take 5 mg by mouth daily.    Marland Kitchen aspirin 81 MG tablet Take 81 mg by mouth daily.    . citalopram (CELEXA) 20 MG tablet Take 20 mg by mouth daily.    . ferrous sulfate 325 (65 FE) MG tablet Take 325 mg by mouth once a week. Take 1 tablet on Sundays.    . fluticasone (FLONASE) 50 MCG/ACT nasal spray Place into the nose.    Marland Kitchen JAKAFI 10 MG tablet TAKE 1 TABLET TWICE A DAY 60 tablet 12  . lisinopril (PRINIVIL,ZESTRIL) 40 MG tablet Take 40 mg by mouth daily.    Marland Kitchen omeprazole (PRILOSEC) 20 MG capsule Take 20 mg by mouth every morning.    . Vitamin D, Ergocalciferol, (DRISDOL) 50000 units CAPS capsule Take 50,000 Units by mouth every Sunday.     No current facility-administered medications on file prior to visit.     No Known Allergies     Observations/Objective: There were no vitals filed for this visit. There is no height or weight on file to calculate BMI.  Pain 0. Physical Exam  Constitutional: She is oriented to person, place, and time. No distress.  Frail elderly female  HENT:  Head: Normocephalic and atraumatic.  Pulmonary/Chest: Effort normal.  Neurological: She is alert and oriented to person, place, and time.  Psychiatric: Affect normal.    CBC    Component Value Date/Time   WBC 13.6 (H)  06/20/2018 1311   RBC 3.43 (L) 06/20/2018 1311   HGB 10.3 (L) 06/20/2018 1311   HCT 33.8 (L) 06/20/2018 1311   PLT 260 06/20/2018 1311   MCV 98.5 06/20/2018 1311   MCH 30.0 06/20/2018 1311   MCHC 30.5 06/20/2018 1311   RDW 20.3 (H) 06/20/2018 1311   LYMPHSABS 1.9 06/20/2018 1311   MONOABS 0.1 06/20/2018 1311   EOSABS 0.4 06/20/2018 1311   BASOSABS 0.7 (H) 06/20/2018 1311    CMP     Component Value Date/Time   NA 144 06/20/2018 1311   K 3.8 06/20/2018 1311   CL 109 06/20/2018 1311   CO2 26 06/20/2018 1311   GLUCOSE 106 (H)  06/20/2018 1311   BUN 17 06/20/2018 1311   CREATININE 0.88 06/20/2018 1311   CALCIUM 8.7 (L) 06/20/2018 1311   PROT 7.0 06/20/2018 1311   ALBUMIN 4.1 06/20/2018 1311   AST 36 06/20/2018 1311   ALT 23 06/20/2018 1311   ALKPHOS 56 06/20/2018 1311   BILITOT 0.6 06/20/2018 1311   GFRNONAA 59 (L) 06/20/2018 1311   GFRAA >60 06/20/2018 1311     Assessment and Plan: 1. JAK-2 gene mutation   2. Myelofibrosis ([place])   3. Normocytic anemia     Labs are reviewed and discussed with patient. She continues to do well clinically. Tolerating Jakafi 10 mg twice daily. Recommend patient to continue on this regimen.  Normocytic anemia, hemoglobin stable.  Leukocytosis, stable, improved comparing to last visit. Continue to monitor.    Follow Up Instructions:  lab md in 2 months.    I discussed the assessment and treatment plan with the patient. The patient was provided an opportunity to ask questions and all were answered. The patient agreed with the plan and demonstrated an understanding of the instructions.  The patient was advised to call back or seek an in-person evaluation if the symptoms worsen or if the condition fails to improve as anticipated.   I provided 15 minutes of face-to-face video visit time during this encounter, and > 50% was spent counseling as documented under my assessment & plan.  Earlie Server, MD 06/21/2018 9:31 PM

## 2018-06-21 NOTE — Progress Notes (Signed)
Patient contacted for telehealth visit.

## 2018-08-01 ENCOUNTER — Encounter: Payer: Self-pay | Admitting: Oncology

## 2018-08-21 ENCOUNTER — Inpatient Hospital Stay: Payer: Medicare Other | Attending: Oncology

## 2018-08-21 DIAGNOSIS — Z8042 Family history of malignant neoplasm of prostate: Secondary | ICD-10-CM | POA: Insufficient documentation

## 2018-08-21 DIAGNOSIS — D696 Thrombocytopenia, unspecified: Secondary | ICD-10-CM | POA: Insufficient documentation

## 2018-08-21 DIAGNOSIS — D72829 Elevated white blood cell count, unspecified: Secondary | ICD-10-CM | POA: Insufficient documentation

## 2018-08-21 DIAGNOSIS — Z1589 Genetic susceptibility to other disease: Secondary | ICD-10-CM | POA: Insufficient documentation

## 2018-08-21 DIAGNOSIS — I10 Essential (primary) hypertension: Secondary | ICD-10-CM | POA: Insufficient documentation

## 2018-08-21 DIAGNOSIS — D7581 Myelofibrosis: Secondary | ICD-10-CM | POA: Insufficient documentation

## 2018-08-21 DIAGNOSIS — Z806 Family history of leukemia: Secondary | ICD-10-CM | POA: Insufficient documentation

## 2018-08-21 DIAGNOSIS — Z803 Family history of malignant neoplasm of breast: Secondary | ICD-10-CM | POA: Insufficient documentation

## 2018-08-21 DIAGNOSIS — D649 Anemia, unspecified: Secondary | ICD-10-CM | POA: Insufficient documentation

## 2018-08-21 DIAGNOSIS — Z87891 Personal history of nicotine dependence: Secondary | ICD-10-CM | POA: Insufficient documentation

## 2018-08-21 DIAGNOSIS — Z801 Family history of malignant neoplasm of trachea, bronchus and lung: Secondary | ICD-10-CM | POA: Insufficient documentation

## 2018-08-22 ENCOUNTER — Inpatient Hospital Stay (HOSPITAL_BASED_OUTPATIENT_CLINIC_OR_DEPARTMENT_OTHER): Payer: Medicare Other | Admitting: Oncology

## 2018-08-22 ENCOUNTER — Encounter: Payer: Self-pay | Admitting: Oncology

## 2018-08-22 ENCOUNTER — Other Ambulatory Visit: Payer: Self-pay

## 2018-08-22 DIAGNOSIS — D7581 Myelofibrosis: Secondary | ICD-10-CM

## 2018-08-22 DIAGNOSIS — R21 Rash and other nonspecific skin eruption: Secondary | ICD-10-CM

## 2018-08-22 DIAGNOSIS — D649 Anemia, unspecified: Secondary | ICD-10-CM

## 2018-08-22 DIAGNOSIS — Z1589 Genetic susceptibility to other disease: Secondary | ICD-10-CM | POA: Diagnosis not present

## 2018-08-22 NOTE — Progress Notes (Signed)
HEMATOLOGY-ONCOLOGY TeleHEALTH VISIT PROGRESS NOTE  I connected with [name] on 08/22/18 at 10:30 AM EDT by video enabled telemedicine visit and verified that I am speaking with the correct person using two identifiers. I discussed the limitations, risks, security and privacy concerns of performing an evaluation and management service by telemedicine and the availability of in-person appointments. I also discussed with the patient that there may be a patient responsible charge related to this service. The patient expressed understanding and agreed to proceed.   Other persons participating in the visit and their role in the encounter:  Daughter, to set up virtual visit.   Patient's location: Home  Provider's location: office Chief Complaint: Follow-up for management of primary myelofibrosis   INTERVAL HISTORY [name] is a 85 y.o. female who has above history reviewed by me today presents for follow up visit for management of primary myelofibrosis. Problems and complaints are listed below:  She feels week today.  Had pneumonia and was treated with a course of Levaquin and prednisone. Cough shortness of breath has significantly improved. Patient feels that she is to little bit weak, not back to baseline yet. Daughter reports that patient has stopped taking Jakafi grams twice daily for the past few weeks. Also patient had developed erythematous her back takes Benadryl which helps relieving symptoms.  Also applied hydrocortisone creams which did not help.  Rash is not raised. Appetite is fair. No fever or chills. Review of Systems  Constitutional: Positive for fatigue. Negative for appetite change, chills and fever.  HENT:   Negative for hearing loss and voice change.   Eyes: Negative for eye problems.  Respiratory: Negative for chest tightness and cough.   Cardiovascular: Negative for chest pain.  Gastrointestinal: Negative for abdominal distention, abdominal pain and  blood in stool.  Endocrine: Negative for hot flashes.  Genitourinary: Negative for difficulty urinating and frequency.   Musculoskeletal: Negative for arthralgias.  Skin: Negative for itching and rash.  Neurological: Negative for extremity weakness.  Hematological: Negative for adenopathy.  Psychiatric/Behavioral: Negative for confusion.    Past Medical History:  Diagnosis Date  . Aneurysm of anterior cerebral artery   . Blindness of left eye    secondary to cataract surgery complication  . Depression   . Hypertension   . SAH (subarachnoid hemorrhage) ([place]) 02/2006  . Tobacco abuse    Past Surgical History:  Procedure Laterality Date  . ABDOMINAL HYSTERECTOMY    . ANEURYSM COILING    . CATARACT EXTRACTION    . CHOLECYSTECTOMY      Family History  Problem Relation Age of Onset  . Breast cancer Daughter 50  . Breast cancer Sister   . Throat cancer Mother   . Lung cancer Father   . Prostate cancer Brother   . Leukemia Brother   . Prostate cancer Brother   . Leukemia Brother     Social History   Socioeconomic History  . Marital status: Widowed    Spouse name: Not on file  . Number of children: 1  . Years of education: Not on file  . Highest education level: Not on file  Occupational History  . Not on file  Social Needs  . Financial resource strain: Not hard at all  . Food insecurity    Worry: Never true    Inability: Not on file  . Transportation needs    Medical: Patient refused    Non-medical: Patient refused  Tobacco Use  . Smoking status: Former Smoker  Quit date: 07/31/2015    Years since quitting: 3.0  . Smokeless tobacco: Never Used  Substance and Sexual Activity  . Alcohol use: Yes    Alcohol/week: 1.0 standard drinks    Types: 1 Glasses of wine per week    Comment: once a month  . Drug use: No  . Sexual activity: Not on file  Lifestyle  . Physical activity    Days per week: Patient refused    Minutes per session: Patient refused  . Stress:  Not at all  Relationships  . Social Herbalist on phone: Not on file    Gets together: Not on file    Attends religious service: Not on file    Active member of club or organization: Not on file    Attends meetings of clubs or organizations: Not on file    Relationship status: Not on file  . Intimate partner violence    Fear of current or ex partner: Patient refused    Emotionally abused: Patient refused    Physically abused: Patient refused    Forced sexual activity: Patient refused  Other Topics Concern  . Not on file  Social History Narrative  . Not on file    Current Outpatient Medications on File Prior to Visit  Medication Sig Dispense Refill  . albuterol (PROVENTIL HFA;VENTOLIN HFA) 108 (90 Base) MCG/ACT inhaler Inhale 2 puffs into the lungs every 6 (six) hours as needed for wheezing or shortness of breath. 1 Inhaler 2  . ALPRAZolam (XANAX) 0.25 MG tablet Take 0.25 mg by mouth daily as needed.    Marland Kitchen amLODipine (NORVASC) 5 MG tablet Take 5 mg by mouth daily.    Marland Kitchen aspirin 81 MG tablet Take 81 mg by mouth daily.    . citalopram (CELEXA) 20 MG tablet Take 20 mg by mouth daily.    . ferrous sulfate 325 (65 FE) MG tablet Take 325 mg by mouth once a week. Take 1 tablet on Sundays.    . fluticasone (FLONASE) 50 MCG/ACT nasal spray Place into the nose.    Marland Kitchen JAKAFI 10 MG tablet TAKE 1 TABLET TWICE A DAY 60 tablet 12  . lisinopril (PRINIVIL,ZESTRIL) 40 MG tablet Take 40 mg by mouth daily.    Marland Kitchen omeprazole (PRILOSEC) 20 MG capsule Take 20 mg by mouth every morning.    . Vitamin D, Ergocalciferol, (DRISDOL) 50000 units CAPS capsule Take 50,000 Units by mouth every Sunday.     No current facility-administered medications on file prior to visit.     No Known Allergies     Observations/Objective: There were no vitals filed for this visit. There is no height or weight on file to calculate BMI.  Pain 0. Physical Exam  Constitutional: She is oriented to person, place, and time.  No distress.  Frail elderly female  HENT:  Head: Normocephalic and atraumatic.  Pulmonary/Chest: Effort normal.  Neurological: She is alert and oriented to person, place, and time.  Psychiatric: Affect normal.    CBC    Component Value Date/Time   WBC 13.6 (H) 06/20/2018 1311   RBC 3.43 (L) 06/20/2018 1311   HGB 10.3 (L) 06/20/2018 1311   HCT 33.8 (L) 06/20/2018 1311   PLT 260 06/20/2018 1311   MCV 98.5 06/20/2018 1311   MCH 30.0 06/20/2018 1311   MCHC 30.5 06/20/2018 1311   RDW 20.3 (H) 06/20/2018 1311   LYMPHSABS 1.9 06/20/2018 1311   MONOABS 0.1 06/20/2018 1311   EOSABS 0.4 06/20/2018  1311   BASOSABS 0.7 (H) 06/20/2018 1311    CMP     Component Value Date/Time   NA 144 06/20/2018 1311   K 3.8 06/20/2018 1311   CL 109 06/20/2018 1311   CO2 26 06/20/2018 1311   GLUCOSE 106 (H) 06/20/2018 1311   BUN 17 06/20/2018 1311   CREATININE 0.88 06/20/2018 1311   CALCIUM 8.7 (L) 06/20/2018 1311   PROT 7.0 06/20/2018 1311   ALBUMIN 4.1 06/20/2018 1311   AST 36 06/20/2018 1311   ALT 23 06/20/2018 1311   ALKPHOS 56 06/20/2018 1311   BILITOT 0.6 06/20/2018 1311   GFRNONAA 59 (L) 06/20/2018 1311   GFRAA >60 06/20/2018 1311     Assessment and Plan: 1. JAK-2 gene mutation   2. Myelofibrosis ([place])   3. Normocytic anemia   4. Rash     Labs reviewed and discussed with patient. She is currently off Jakafi 10 mg twice daily. Feels weak and tired still recovering from recent pneumonia. Recommend patient to stay off Jakafi 10 mg twice daily. Rash, recommend continue Benadryl as needed.   Normocytic anemia, hemoglobin stable. Leukocytosis stable. Continue to monitor. Recommend patient to follow-up in 1 week to have in person visit for further evaluation.   Follow Up Instructions:  1 week   I discussed the assessment and treatment plan with the patient. The patient was provided an opportunity to ask questions and all were answered. The patient agreed with the plan and  demonstrated an understanding of the instructions.  The patient was advised to call back or seek an in-person evaluation if the symptoms worsen or if the condition fails to improve as anticipated.   I provided 15 minutes of face-to-face video visit time during this encounter, and > 50% was spent counseling as documented under my assessment & plan.  Earlie Server, MD 08/22/2018 11:10 AM

## 2018-08-22 NOTE — Progress Notes (Signed)
Called patient for Telehealth via [facility].  Patient stopped taking Jakifi approx 2-3 weeks ago.  Patient c/o itching rash on her back, abdomen, both shoulders and arm, started approx 1 week ago.

## 2018-08-30 ENCOUNTER — Inpatient Hospital Stay: Payer: Medicare Other

## 2018-08-30 ENCOUNTER — Other Ambulatory Visit: Payer: Self-pay

## 2018-08-30 ENCOUNTER — Inpatient Hospital Stay (HOSPITAL_BASED_OUTPATIENT_CLINIC_OR_DEPARTMENT_OTHER): Payer: Medicare Other | Admitting: Oncology

## 2018-08-30 ENCOUNTER — Encounter: Payer: Self-pay | Admitting: Oncology

## 2018-08-30 VITALS — BP 171/71 | HR 79 | Temp 97.6°F | Resp 18 | Wt 100.4 lb

## 2018-08-30 DIAGNOSIS — D72829 Elevated white blood cell count, unspecified: Secondary | ICD-10-CM | POA: Diagnosis present

## 2018-08-30 DIAGNOSIS — Z803 Family history of malignant neoplasm of breast: Secondary | ICD-10-CM

## 2018-08-30 DIAGNOSIS — D649 Anemia, unspecified: Secondary | ICD-10-CM | POA: Diagnosis not present

## 2018-08-30 DIAGNOSIS — Z1589 Genetic susceptibility to other disease: Secondary | ICD-10-CM

## 2018-08-30 DIAGNOSIS — D696 Thrombocytopenia, unspecified: Secondary | ICD-10-CM | POA: Diagnosis not present

## 2018-08-30 DIAGNOSIS — Z87891 Personal history of nicotine dependence: Secondary | ICD-10-CM | POA: Diagnosis not present

## 2018-08-30 DIAGNOSIS — I10 Essential (primary) hypertension: Secondary | ICD-10-CM

## 2018-08-30 DIAGNOSIS — Z806 Family history of leukemia: Secondary | ICD-10-CM

## 2018-08-30 DIAGNOSIS — Z801 Family history of malignant neoplasm of trachea, bronchus and lung: Secondary | ICD-10-CM | POA: Diagnosis not present

## 2018-08-30 DIAGNOSIS — D7581 Myelofibrosis: Secondary | ICD-10-CM | POA: Diagnosis not present

## 2018-08-30 DIAGNOSIS — Z8042 Family history of malignant neoplasm of prostate: Secondary | ICD-10-CM

## 2018-08-30 LAB — COMPREHENSIVE METABOLIC PANEL
ALT: 13 U/L (ref 0–44)
AST: 30 U/L (ref 15–41)
Albumin: 3.9 g/dL (ref 3.5–5.0)
Alkaline Phosphatase: 76 U/L (ref 38–126)
Anion gap: 8 (ref 5–15)
BUN: 14 mg/dL (ref 8–23)
CO2: 28 mmol/L (ref 22–32)
Calcium: 8.9 mg/dL (ref 8.9–10.3)
Chloride: 107 mmol/L (ref 98–111)
Creatinine, Ser: 0.89 mg/dL (ref 0.44–1.00)
GFR calc Af Amer: >60 mL/min (ref >60)
GFR calc non Af Amer: 58 mL/min — ABNORMAL LOW (ref >60)
Glucose, Bld: 104 mg/dL — ABNORMAL HIGH (ref 70–99)
Potassium: 4.2 mmol/L (ref 3.5–5.1)
Sodium: 143 mmol/L (ref 135–145)
Total Bilirubin: 0.9 mg/dL (ref 0.3–1.2)
Total Protein: 7 g/dL (ref 6.5–8.1)

## 2018-08-30 LAB — CBC WITH DIFFERENTIAL/PLATELET
Abs Immature Granulocytes: 2.1 10*3/uL — ABNORMAL HIGH (ref 0.00–0.07)
Basophils Absolute: 0.3 10*3/uL — ABNORMAL HIGH (ref 0.0–0.1)
Basophils Relative: 2 %
Blasts: 1 %
Eosinophils Absolute: 0.3 10*3/uL (ref 0.0–0.5)
Eosinophils Relative: 2 %
HCT: 38.4 % (ref 36.0–46.0)
Hemoglobin: 11.1 g/dL — ABNORMAL LOW (ref 12.0–15.0)
Lymphocytes Relative: 4 %
Lymphs Abs: 0.6 10*3/uL — ABNORMAL LOW (ref 0.7–4.0)
MCH: 29.2 pg (ref 26.0–34.0)
MCHC: 28.9 g/dL — ABNORMAL LOW (ref 30.0–36.0)
MCV: 101.1 fL — ABNORMAL HIGH (ref 80.0–100.0)
Metamyelocytes Relative: 8 %
Monocytes Absolute: 1.1 10*3/uL — ABNORMAL HIGH (ref 0.1–1.0)
Monocytes Relative: 7 %
Myelocytes: 5 %
Neutro Abs: 11.2 10*3/uL — ABNORMAL HIGH (ref 1.7–7.7)
Neutrophils Relative %: 71 %
Platelets: 446 10*3/uL — ABNORMAL HIGH (ref 150–400)
RBC: 3.8 MIL/uL — ABNORMAL LOW (ref 3.87–5.11)
RDW: 22.3 % — ABNORMAL HIGH (ref 11.5–15.5)
Smear Review: ADEQUATE
WBC: 15.8 10*3/uL — ABNORMAL HIGH (ref 4.0–10.5)
nRBC: 8.4 % — ABNORMAL HIGH (ref 0.0–0.2)

## 2018-08-30 NOTE — Progress Notes (Signed)
Hematology/Oncology Follow up note [facility] [phone] [phone]   Patient Care Team: Tracie Harrier, MD as PCP - General (Internal Medicine)  REFERRING PROVIDER: Tracie Harrier, MD  REASON FOR VISIT Follow up for treatment of Leukocytosis and a splenomegaly.   HISTORY OF PRESENTING ILLNESS:  [name] is a  85 y.o.  female with PMH listed below who was referred to me for evaluation of leukocytosis and splenomegaly. Patient was recently admitted to outside facility [facility] in [facility] on Jun 12, 2017 after she developed rectal bleeding.  CT was done during that visit which showed a splenomegaly, spleen enlarged at 14 cm.  There is an indeterminant 1.5 cm right adrenal mass.  Rectal bleeding was considered to be secondary to diverticulitis. Patient follows up with Dr. Ginette Pitman primary care physician. On being 29 2019.  CBC showed white count 14.3, hemoglobin 9.9, MCV 99.4, immature granulocytes 1.2, reticulocyte percentage 7.78 elevated, platelet count 386,000, differential showed neutrophilia, ANC 9.93, basophils 0.47,  Reviewed patient's previous lab work-up.  She has had leukocytosis since October 2018 with similar differential abnormalities. Patient has also reported easy bruising, no episodes of acute bleeding.  She also had weight loss about 12 pounds for the past 2 to 3 months.  Reports feeling fatigued.    Bone marrow biopsy aspirate showed hypercellular bone marrow with features of myeloproliferative neoplasm.  Discussed with Dr. Gari Crown All aspirate material is suboptimal but "biopsy touch imprint in the peripheral blood mostly show features of myeloproliferative neoplasm.  Particular the primary myelofibrosis especially in conjunction with patient's previously documented Jak 2 mutation.  No apparent increasing blast cells is seen.  An iron stain performed on touch imprints showed scattered offering  centroblast.  Letter finding is of uncertain significance in this setting and may represent a dysplastic component.  Cytogenetics shows normal karyotype.  Foundation heme pending high molecular risk mutation status[ ASXL1, SRSF2, [id number], EZH2, IDH1/2] . GIPSS prognostic model: 1 point or more depending on foundation HEME results.  MIPSS70+ v2.0: She has constitutional symptoms which is 2 points, normal karyotype.  Status of high molecular risk mutational unknown Likely intermediate risk or higher.  INTERVAL HISTORY BEDIE Hurley is a 85 y.o. female who has above history reviewed by me today presents for follow up for primary myelofibrosis. Daughter [name] was called she is able to overheard the conversation participating discussion. Patient reports feeling pretty well today.  Recovered from recent pneumonia. No cough no shortness of breath. No fever or chills. Rash has improved.  Still mild itchiness. Appetite is getting better. She has stopped Jakafi. Fatigue  Review of Systems  Constitutional: Positive for fatigue. Negative for appetite change, chills and fever.  HENT:   Negative for hearing loss and voice change.   Eyes: Negative for eye problems.  Respiratory: Negative for chest tightness and cough.   Cardiovascular: Negative for chest pain.  Gastrointestinal: Negative for abdominal distention, abdominal pain and blood in stool.  Endocrine: Negative for hot flashes.  Genitourinary: Negative for difficulty urinating and frequency.   Musculoskeletal: Negative for arthralgias.  Skin: Negative for itching and rash.  Neurological: Negative for extremity weakness.  Hematological: Negative for adenopathy.  Psychiatric/Behavioral: Negative for confusion.    MEDICAL HISTORY:  Past Medical History:  Diagnosis Date  . Aneurysm of anterior cerebral artery   . Blindness of left eye    secondary to cataract surgery complication  . Depression   . Hypertension   . SAH  (  subarachnoid hemorrhage) ([place]) 02/2006  . Tobacco abuse     SURGICAL HISTORY: Past Surgical History:  Procedure Laterality Date  . ABDOMINAL HYSTERECTOMY    . ANEURYSM COILING    . CATARACT EXTRACTION    . CHOLECYSTECTOMY      SOCIAL HISTORY: Social History   Socioeconomic History  . Marital status: Widowed    Spouse name: Not on file  . Number of children: 1  . Years of education: Not on file  . Highest education level: Not on file  Occupational History  . Not on file  Social Needs  . Financial resource strain: Not hard at all  . Food insecurity    Worry: Never true    Inability: Not on file  . Transportation needs    Medical: Patient refused    Non-medical: Patient refused  Tobacco Use  . Smoking status: Former Smoker    Quit date: 07/31/2015    Years since quitting: 3.0  . Smokeless tobacco: Never Used  Substance and Sexual Activity  . Alcohol use: Yes    Alcohol/week: 1.0 standard drinks    Types: 1 Glasses of wine per week    Comment: once a month  . Drug use: No  . Sexual activity: Not on file  Lifestyle  . Physical activity    Days per week: Patient refused    Minutes per session: Patient refused  . Stress: Not at all  Relationships  . Social Herbalist on phone: Not on file    Gets together: Not on file    Attends religious service: Not on file    Active member of club or organization: Not on file    Attends meetings of clubs or organizations: Not on file    Relationship status: Not on file  . Intimate partner violence    Fear of current or ex partner: Patient refused    Emotionally abused: Patient refused    Physically abused: Patient refused    Forced sexual activity: Patient refused  Other Topics Concern  . Not on file  Social History Narrative  . Not on file    FAMILY HISTORY: Family History  Problem Relation Age of Onset  . Breast cancer Daughter 56  . Breast cancer Sister   . Throat cancer Mother   . Lung cancer Father    . Prostate cancer Brother   . Leukemia Brother   . Prostate cancer Brother   . Leukemia Brother     ALLERGIES:  has No Known Allergies.  MEDICATIONS:  Current Outpatient Medications  Medication Sig Dispense Refill  . albuterol (PROVENTIL HFA;VENTOLIN HFA) 108 (90 Base) MCG/ACT inhaler Inhale 2 puffs into the lungs every 6 (six) hours as needed for wheezing or shortness of breath. 1 Inhaler 2  . ALPRAZolam (XANAX) 0.25 MG tablet Take 0.25 mg by mouth daily as needed.    Marland Kitchen amLODipine (NORVASC) 5 MG tablet Take 5 mg by mouth daily.    Marland Kitchen aspirin 81 MG tablet Take 81 mg by mouth daily.    . citalopram (CELEXA) 20 MG tablet Take 20 mg by mouth daily.    . ferrous sulfate 325 (65 FE) MG tablet Take 325 mg by mouth once a week. Take 1 tablet on Sundays.    . fluticasone (FLONASE) 50 MCG/ACT nasal spray Place into the nose.    Marland Kitchen lisinopril (PRINIVIL,ZESTRIL) 40 MG tablet Take 40 mg by mouth daily.    Marland Kitchen omeprazole (PRILOSEC) 20 MG capsule Take 20  mg by mouth every morning.    . Vitamin D, Ergocalciferol, (DRISDOL) 50000 units CAPS capsule Take 50,000 Units by mouth every Sunday.    Marland Kitchen JAKAFI 10 MG tablet TAKE 1 TABLET TWICE A DAY (Patient not taking: Reported on 08/22/2018) 60 tablet 12   No current facility-administered medications for this visit.      PHYSICAL EXAMINATION: ECOG PERFORMANCE STATUS: 1 - Symptomatic but completely ambulatory Vitals:   08/30/18 0928  BP: (!) 171/71  Pulse: 79  Resp: 18  Temp: 97.6 F (36.4 C)   Filed Weights   08/30/18 0928  Weight: 100 lb 6.4 oz (45.5 kg)    Physical Exam Constitutional:      General: She is not in acute distress.    Comments: Thin, walk in   HENT:     Head: Normocephalic and atraumatic.  Eyes:     General: No scleral icterus.    Conjunctiva/sclera: Conjunctivae normal.     Pupils: Pupils are equal, round, and reactive to light.  Neck:     Musculoskeletal: Normal range of motion and neck supple.  Cardiovascular:     Rate  and Rhythm: Normal rate and regular rhythm.     Heart sounds: Normal heart sounds.  Pulmonary:     Effort: Pulmonary effort is normal. No respiratory distress.     Breath sounds: Normal breath sounds. No wheezing or rales.  Chest:     Chest wall: No tenderness.  Abdominal:     General: Bowel sounds are normal. There is no distension.     Palpations: Abdomen is soft. There is no mass.     Tenderness: There is no abdominal tenderness.     Comments: No splenomegaly.   Musculoskeletal: Normal range of motion.        General: No deformity.  Lymphadenopathy:     Cervical: No cervical adenopathy.  Skin:    General: Skin is warm and dry.     Findings: No erythema or rash.  Neurological:     Mental Status: She is alert and oriented to person, place, and time.     Cranial Nerves: No cranial nerve deficit.     Coordination: Coordination normal.  Psychiatric:        Behavior: Behavior normal.        Thought Content: Thought content normal.      LABORATORY DATA:  I have reviewed the data as listed Lab Results  Component Value Date   WBC 15.8 (H) 08/30/2018   HGB 11.1 (L) 08/30/2018   HCT 38.4 08/30/2018   MCV 101.1 (H) 08/30/2018   PLT 446 (H) 08/30/2018   Recent Labs    05/17/18 1413 06/20/18 1311 08/30/18 1007  NA 142 144 143  K 4.0 3.8 4.2  CL 109 109 107  CO2 '24 26 28  ' GLUCOSE 134* 106* 104*  BUN '21 17 14  ' CREATININE 1.01* 0.88 0.89  CALCIUM 8.9 8.7* 8.9  GFRNONAA 50* 59* 58*  GFRAA 58* >60 >60  PROT 6.8 7.0 7.0  ALBUMIN 4.2 4.1 3.9  AST 35 36 30  ALT '23 23 13  ' ALKPHOS 52 56 76  BILITOT 0.6 0.6 0.9    Peripheral blood flow cytometry showed neutrophilia with left shift, less than 1% myeloblasts and basophilia.  CBC and a phenotypic findings suggest a myeloproliferative disorder   # BCR ABL FISH negative. 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 JAK-2 gene mutation   2. Myelofibrosis ([place])   3.  Normocytic anemia   4. Uncontrolled hypertension     # JAK 2 V617 mutation positive, myelofibrosis Labs are reviewed and discussed with patient and daughter. She has self discontinued Jakafi. Leukocytosis and thrombocytopenia slightly worse.  Physical examination did not review rebound splenomegaly. Advised patient to resume Jakafi 10 mg twice daily. #Blood pressure has been better controlled.  Continue monitor.   We spent sufficient time to discuss many aspect of care, questions were answered to patient's satisfaction. 4 weeks for MD assessment and repeat CBC and CMP.Marland Kitchen    Earlie Server, MD, PhD Hematology Oncology [place] at [facility] 08/30/2018

## 2018-08-30 NOTE — Progress Notes (Signed)
Patient reports that the [facility] made her mouth sore and stopped taking.  She states that her mouth is "sort of" improved.

## 2018-09-28 ENCOUNTER — Inpatient Hospital Stay: Payer: Medicare Other

## 2018-09-28 ENCOUNTER — Inpatient Hospital Stay: Payer: Medicare Other | Admitting: Oncology

## 2018-10-02 ENCOUNTER — Other Ambulatory Visit: Payer: Self-pay

## 2018-10-02 ENCOUNTER — Inpatient Hospital Stay (HOSPITAL_BASED_OUTPATIENT_CLINIC_OR_DEPARTMENT_OTHER): Payer: Medicare Other | Admitting: Oncology

## 2018-10-02 ENCOUNTER — Encounter: Payer: Self-pay | Admitting: Oncology

## 2018-10-02 ENCOUNTER — Inpatient Hospital Stay: Payer: Medicare Other | Attending: Oncology

## 2018-10-02 ENCOUNTER — Ambulatory Visit: Payer: Medicare Other

## 2018-10-02 VITALS — BP 173/65 | HR 73 | Temp 98.3°F | Wt 99.3 lb

## 2018-10-02 DIAGNOSIS — Z803 Family history of malignant neoplasm of breast: Secondary | ICD-10-CM | POA: Insufficient documentation

## 2018-10-02 DIAGNOSIS — M7989 Other specified soft tissue disorders: Secondary | ICD-10-CM | POA: Insufficient documentation

## 2018-10-02 DIAGNOSIS — Z7982 Long term (current) use of aspirin: Secondary | ICD-10-CM | POA: Insufficient documentation

## 2018-10-02 DIAGNOSIS — I10 Essential (primary) hypertension: Secondary | ICD-10-CM | POA: Diagnosis not present

## 2018-10-02 DIAGNOSIS — D471 Chronic myeloproliferative disease: Secondary | ICD-10-CM | POA: Diagnosis present

## 2018-10-02 DIAGNOSIS — Z87891 Personal history of nicotine dependence: Secondary | ICD-10-CM | POA: Insufficient documentation

## 2018-10-02 DIAGNOSIS — D649 Anemia, unspecified: Secondary | ICD-10-CM

## 2018-10-02 DIAGNOSIS — Z79899 Other long term (current) drug therapy: Secondary | ICD-10-CM | POA: Diagnosis not present

## 2018-10-02 DIAGNOSIS — Z1589 Genetic susceptibility to other disease: Secondary | ICD-10-CM

## 2018-10-02 DIAGNOSIS — D72829 Elevated white blood cell count, unspecified: Secondary | ICD-10-CM | POA: Insufficient documentation

## 2018-10-02 DIAGNOSIS — D7581 Myelofibrosis: Secondary | ICD-10-CM | POA: Diagnosis not present

## 2018-10-02 DIAGNOSIS — F329 Major depressive disorder, single episode, unspecified: Secondary | ICD-10-CM | POA: Insufficient documentation

## 2018-10-02 LAB — COMPREHENSIVE METABOLIC PANEL
ALT: 15 U/L (ref 0–44)
AST: 29 U/L (ref 15–41)
Albumin: 4.1 g/dL (ref 3.5–5.0)
Alkaline Phosphatase: 49 U/L (ref 38–126)
Anion gap: 6 (ref 5–15)
BUN: 13 mg/dL (ref 8–23)
CO2: 28 mmol/L (ref 22–32)
Calcium: 9 mg/dL (ref 8.9–10.3)
Chloride: 109 mmol/L (ref 98–111)
Creatinine, Ser: 0.79 mg/dL (ref 0.44–1.00)
GFR calc Af Amer: >60 mL/min (ref >60)
GFR calc non Af Amer: >60 mL/min (ref >60)
Glucose, Bld: 98 mg/dL (ref 70–99)
Potassium: 3.9 mmol/L (ref 3.5–5.1)
Sodium: 143 mmol/L (ref 135–145)
Total Bilirubin: 0.8 mg/dL (ref 0.3–1.2)
Total Protein: 6.9 g/dL (ref 6.5–8.1)

## 2018-10-02 LAB — CBC WITH DIFFERENTIAL/PLATELET
Abs Immature Granulocytes: 2.3 10*3/uL — ABNORMAL HIGH (ref 0.00–0.07)
Band Neutrophils: 10 %
Basophils Absolute: 0.5 10*3/uL — ABNORMAL HIGH (ref 0.0–0.1)
Basophils Relative: 4 %
Blasts: 1 %
Eosinophils Absolute: 0.2 10*3/uL (ref 0.0–0.5)
Eosinophils Relative: 2 %
HCT: 34.2 % — ABNORMAL LOW (ref 36.0–46.0)
Hemoglobin: 10.3 g/dL — ABNORMAL LOW (ref 12.0–15.0)
Lymphocytes Relative: 9 %
Lymphs Abs: 1.1 10*3/uL (ref 0.7–4.0)
MCH: 29.5 pg (ref 26.0–34.0)
MCHC: 30.1 g/dL (ref 30.0–36.0)
MCV: 98 fL (ref 80.0–100.0)
Metamyelocytes Relative: 6 %
Monocytes Absolute: 0.2 10*3/uL (ref 0.1–1.0)
Monocytes Relative: 2 %
Myelocytes: 12 %
Neutro Abs: 7.7 10*3/uL (ref 1.7–7.7)
Neutrophils Relative %: 53 %
Platelets: 208 10*3/uL (ref 150–400)
Promyelocytes Relative: 1 %
RBC: 3.49 MIL/uL — ABNORMAL LOW (ref 3.87–5.11)
RDW: 20.4 % — ABNORMAL HIGH (ref 11.5–15.5)
Smear Review: NORMAL
WBC: 12.2 10*3/uL — ABNORMAL HIGH (ref 4.0–10.5)
nRBC: 8.9 % — ABNORMAL HIGH (ref 0.0–0.2)

## 2018-10-02 NOTE — Progress Notes (Signed)
Hematology/Oncology Follow up note [facility] [phone] [phone]   Patient Care Team: Tracie Harrier, MD as PCP - General (Internal Medicine)  REFERRING PROVIDER: Tracie Harrier, MD  REASON FOR VISIT Follow up for treatment of Leukocytosis and a splenomegaly.   HISTORY OF PRESENTING ILLNESS:  [name] is a  85 y.o.  female with PMH listed below who was referred to me for evaluation of leukocytosis and splenomegaly. Patient was recently admitted to outside facility [facility] in [facility] on Jun 12, 2017 after she developed rectal bleeding.  CT was done during that visit which showed a splenomegaly, spleen enlarged at 14 cm.  There is an indeterminant 1.5 cm right adrenal mass.  Rectal bleeding was considered to be secondary to diverticulitis. Patient follows up with Dr. Ginette Pitman primary care physician. On being 29 2019.  CBC showed white count 14.3, hemoglobin 9.9, MCV 99.4, immature granulocytes 1.2, reticulocyte percentage 7.78 elevated, platelet count 386,000, differential showed neutrophilia, ANC 9.93, basophils 0.47,  Reviewed patient's previous lab work-up.  She has had leukocytosis since October 2018 with similar differential abnormalities. Patient has also reported easy bruising, no episodes of acute bleeding.  She also had weight loss about 12 pounds for the past 2 to 3 months.  Reports feeling fatigued.    Bone marrow biopsy aspirate showed hypercellular bone marrow with features of myeloproliferative neoplasm.  Discussed with Dr. Gari Crown All aspirate material is suboptimal but "biopsy touch imprint in the peripheral blood mostly show features of myeloproliferative neoplasm.  Particular the primary myelofibrosis especially in conjunction with patient's previously documented Jak 2 mutation.  No apparent increasing blast cells is seen.  An iron stain performed on touch imprints showed scattered offering  centroblast.  Letter finding is of uncertain significance in this setting and may represent a dysplastic component.  Cytogenetics shows normal karyotype.  Foundation heme pending high molecular risk mutation status[ ASXL1, SRSF2, [id number], EZH2, IDH1/2] . GIPSS prognostic model: 1 point or more depending on foundation HEME results.  MIPSS70+ v2.0: She has constitutional symptoms which is 2 points, normal karyotype.  Status of high molecular risk mutational unknown Likely intermediate risk or higher.  INTERVAL HISTORY [name] is a 85 y.o. female who has above history reviewed by me today presents for follow up for primary myelofibrosis. Daughter [name] was called during encounter and she did not answer. Patient reports feeling quite well today.  She has resumed on Jakafi 10 mg twice daily. No new complaints. Appetite remains good. Rash has resolved. Fatigue is at baseline  Review of Systems  Constitutional: Positive for fatigue. Negative for appetite change, chills and fever.  HENT:   Negative for hearing loss and voice change.   Eyes: Negative for eye problems.  Respiratory: Negative for chest tightness and cough.   Cardiovascular: Negative for chest pain.  Gastrointestinal: Negative for abdominal distention, abdominal pain and blood in stool.  Endocrine: Negative for hot flashes.  Genitourinary: Negative for difficulty urinating and frequency.   Musculoskeletal: Negative for arthralgias.  Skin: Negative for itching and rash.  Neurological: Negative for extremity weakness.  Hematological: Negative for adenopathy.  Psychiatric/Behavioral: Negative for confusion.    MEDICAL HISTORY:  Past Medical History:  Diagnosis Date  . Aneurysm of anterior cerebral artery   . Blindness of left eye    secondary to cataract surgery complication  . Depression   . Hypertension   . SAH (subarachnoid hemorrhage) ([place]) 02/2006  . Tobacco abuse  SURGICAL HISTORY: Past Surgical  History:  Procedure Laterality Date  . ABDOMINAL HYSTERECTOMY    . ANEURYSM COILING    . CATARACT EXTRACTION    . CHOLECYSTECTOMY      SOCIAL HISTORY: Social History   Socioeconomic History  . Marital status: Widowed    Spouse name: Not on file  . Number of children: 1  . Years of education: Not on file  . Highest education level: Not on file  Occupational History  . Not on file  Social Needs  . Financial resource strain: Not hard at all  . Food insecurity    Worry: Never true    Inability: Not on file  . Transportation needs    Medical: Patient refused    Non-medical: Patient refused  Tobacco Use  . Smoking status: Former Smoker    Quit date: 07/31/2015    Years since quitting: 3.1  . Smokeless tobacco: Never Used  Substance and Sexual Activity  . Alcohol use: Yes    Alcohol/week: 1.0 standard drinks    Types: 1 Glasses of wine per week    Comment: once a month  . Drug use: No  . Sexual activity: Not on file  Lifestyle  . Physical activity    Days per week: Patient refused    Minutes per session: Patient refused  . Stress: Not at all  Relationships  . Social Herbalist on phone: Not on file    Gets together: Not on file    Attends religious service: Not on file    Active member of club or organization: Not on file    Attends meetings of clubs or organizations: Not on file    Relationship status: Not on file  . Intimate partner violence    Fear of current or ex partner: Patient refused    Emotionally abused: Patient refused    Physically abused: Patient refused    Forced sexual activity: Patient refused  Other Topics Concern  . Not on file  Social History Narrative  . Not on file    FAMILY HISTORY: Family History  Problem Relation Age of Onset  . Breast cancer Daughter 32  . Breast cancer Sister   . Throat cancer Mother   . Lung cancer Father   . Prostate cancer Brother   . Leukemia Brother   . Prostate cancer Brother   . Leukemia  Brother     ALLERGIES:  has No Known Allergies.  MEDICATIONS:  Current Outpatient Medications  Medication Sig Dispense Refill  . albuterol (PROVENTIL HFA;VENTOLIN HFA) 108 (90 Base) MCG/ACT inhaler Inhale 2 puffs into the lungs every 6 (six) hours as needed for wheezing or shortness of breath. 1 Inhaler 2  . ALPRAZolam (XANAX) 0.25 MG tablet Take 0.25 mg by mouth daily as needed.    Marland Kitchen amLODipine (NORVASC) 5 MG tablet Take 5 mg by mouth daily.    Marland Kitchen aspirin 81 MG tablet Take 81 mg by mouth daily.    . citalopram (CELEXA) 20 MG tablet Take 20 mg by mouth daily.    . ferrous sulfate 325 (65 FE) MG tablet Take 325 mg by mouth once a week. Take 1 tablet on Sundays.    . fluticasone (FLONASE) 50 MCG/ACT nasal spray Place into the nose.    Marland Kitchen JAKAFI 10 MG tablet TAKE 1 TABLET TWICE A DAY 60 tablet 12  . lisinopril (PRINIVIL,ZESTRIL) 40 MG tablet Take 40 mg by mouth daily.    Marland Kitchen omeprazole (PRILOSEC) 20  MG capsule Take 20 mg by mouth every morning.    . Vitamin D, Ergocalciferol, (DRISDOL) 50000 units CAPS capsule Take 50,000 Units by mouth every Sunday.     No current facility-administered medications for this visit.      PHYSICAL EXAMINATION: ECOG PERFORMANCE STATUS: 1 - Symptomatic but completely ambulatory Vitals:   10/02/18 0904  BP: (!) 173/65  Pulse: 73  Temp: 98.3 F (36.8 C)   Filed Weights   10/02/18 0904  Weight: 99 lb 5 oz (45 kg)    Physical Exam Constitutional:      General: She is not in acute distress.    Comments: Thin, walk in   HENT:     Head: Normocephalic and atraumatic.  Eyes:     General: No scleral icterus.    Conjunctiva/sclera: Conjunctivae normal.     Pupils: Pupils are equal, round, and reactive to light.  Neck:     Musculoskeletal: Normal range of motion and neck supple.  Cardiovascular:     Rate and Rhythm: Normal rate and regular rhythm.     Heart sounds: Normal heart sounds.  Pulmonary:     Effort: Pulmonary effort is normal. No respiratory  distress.     Breath sounds: Normal breath sounds. No wheezing or rales.  Chest:     Chest wall: No tenderness.  Abdominal:     General: Bowel sounds are normal. There is no distension.     Palpations: Abdomen is soft. There is no mass.     Tenderness: There is no abdominal tenderness.     Comments: No splenomegaly.   Musculoskeletal: Normal range of motion.        General: No deformity.  Lymphadenopathy:     Cervical: No cervical adenopathy.  Skin:    General: Skin is warm and dry.     Findings: No erythema or rash.  Neurological:     Mental Status: She is alert and oriented to person, place, and time.     Cranial Nerves: No cranial nerve deficit.     Coordination: Coordination normal.  Psychiatric:        Behavior: Behavior normal.        Thought Content: Thought content normal.      LABORATORY DATA:  I have reviewed the data as listed Lab Results  Component Value Date   WBC 12.2 (H) 10/02/2018   HGB 10.3 (L) 10/02/2018   HCT 34.2 (L) 10/02/2018   MCV 98.0 10/02/2018   PLT 208 10/02/2018   Recent Labs    06/20/18 1311 08/30/18 1007 10/02/18 0834  NA 144 143 143  K 3.8 4.2 3.9  CL 109 107 109  CO2 '26 28 28  ' GLUCOSE 106* 104* 98  BUN '17 14 13  ' CREATININE 0.88 0.89 0.79  CALCIUM 8.7* 8.9 9.0  GFRNONAA 59* 58* >60  GFRAA >60 >60 >60  PROT 7.0 7.0 6.9  ALBUMIN 4.1 3.9 4.1  AST 36 30 29  ALT '23 13 15  ' ALKPHOS 56 76 49  BILITOT 0.6 0.9 0.8    Peripheral blood flow cytometry showed neutrophilia with left shift, less than 1% myeloblasts and basophilia.  CBC and a phenotypic findings suggest a myeloproliferative disorder   # BCR ABL FISH negative. 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 Left leg swelling   2. Myelofibrosis ([place])   3. JAK-2 gene mutation   4. Normocytic anemia   5. Leukocytosis, unspecified type    # JAK 2 V617  mutation positive, myelofibrosis Labs are reviewed and discussed with patient Counts acceptable to continue on  Jakafi 10 mg twice daily.  #Left lower extremity swelling, will obtain ultrasound to rule out lower extremity DVT. Patient is on aspirin  #Leukocytosis, improved compared to 4 weeks ago. #Chronic anemia, hemoglobin 10.3, slightly decreased compared to 1 month ago likely secondary to [facility].  Continue to monitor.   We spent sufficient time to discuss many aspect of care, questions were answered to patient's satisfaction. Repeat blood work in 32 month Lab MD blood work and assessment in 2 months..  Orders Placed This Encounter  Procedures  . US Venous Img Lower Unilateral Left    Standing Status:   Future    Standing Expiration Date:   10/02/2019    Order Specific Question:   Reason for Exam (SYMPTOM  OR DIAGNOSIS REQUIRED)    Answer:   left leg edema; DVT protocol    Order Specific Question:   Preferred imaging location?    Answer:   [facility]    Earlie Server, MD, PhD Hematology Oncology [place] [facility] 10/02/2018

## 2018-10-02 NOTE — Progress Notes (Signed)
Patient here today for follow up. Patient denies any decrease in appetite, nausea, vomiting, diarrhea, constipation or pain.  

## 2018-10-03 ENCOUNTER — Other Ambulatory Visit: Payer: Self-pay

## 2018-10-03 ENCOUNTER — Ambulatory Visit
Admission: RE | Admit: 2018-10-03 | Discharge: 2018-10-03 | Disposition: A | Discharge status: Home / Self Care | Payer: Medicare Other | Source: Ambulatory Visit | Attending: Oncology | Admitting: Oncology

## 2018-10-03 DIAGNOSIS — M7989 Other specified soft tissue disorders: Secondary | ICD-10-CM | POA: Insufficient documentation

## 2018-10-11 ENCOUNTER — Telehealth: Payer: Self-pay | Admitting: *Deleted

## 2018-10-11 NOTE — Telephone Encounter (Signed)
Thank you :)

## 2018-10-11 NOTE — Telephone Encounter (Signed)
Daughter called asking for results of doppler   CLINICAL DATA:  Left lower extremity edema  EXAM: LEFT LOWER EXTREMITY VENOUS DOPPLER ULTRASOUND  TECHNIQUE: Gray-scale sonography with graded compression, as well as color Doppler and duplex ultrasound were performed to evaluate the lower extremity deep venous systems from the level of the common femoral vein and including the common femoral, femoral, profunda femoral, popliteal and calf veins including the posterior tibial, peroneal and gastrocnemius veins when visible. The superficial great saphenous vein was also interrogated. Spectral Doppler was utilized to evaluate flow at rest and with distal augmentation maneuvers in the common femoral, femoral and popliteal veins.  COMPARISON:  None.  FINDINGS: Contralateral Common Femoral Vein: Respiratory phasicity is normal and symmetric with the symptomatic side. No evidence of thrombus. Normal compressibility.  Common Femoral Vein: No evidence of thrombus. Normal compressibility, respiratory phasicity and response to augmentation.  Saphenofemoral Junction: No evidence of thrombus. Normal compressibility and flow on color Doppler imaging.  Profunda Femoral Vein: No evidence of thrombus. Normal compressibility and flow on color Doppler imaging.  Femoral Vein: No evidence of thrombus. Normal compressibility, respiratory phasicity and response to augmentation.  Popliteal Vein: No evidence of thrombus. Normal compressibility, respiratory phasicity and response to augmentation.  Calf Veins: Limited visualization of the calf veins. No significant thrombus appreciated.  Superficial Great Saphenous Vein: No evidence of thrombus. Normal compressibility.  Venous Reflux:  Not assessed  Other Findings: Peripheral edema noted. Prominent fatty replaced benign left inguinal lymph node.  IMPRESSION: No significant left lower extremity DVT by ultrasound. Limited assessment  of the calf veins.   Electronically Signed   By: Jerilynn Mages.  Shick M.D.   On: 10/03/2018 10:30

## 2018-10-30 ENCOUNTER — Other Ambulatory Visit: Payer: Self-pay

## 2018-10-30 ENCOUNTER — Inpatient Hospital Stay: Payer: Medicare Other | Attending: Oncology

## 2018-10-30 DIAGNOSIS — D72829 Elevated white blood cell count, unspecified: Secondary | ICD-10-CM | POA: Diagnosis present

## 2018-10-30 DIAGNOSIS — Z1589 Genetic susceptibility to other disease: Secondary | ICD-10-CM

## 2018-10-30 LAB — CBC WITH DIFFERENTIAL/PLATELET
Abs Immature Granulocytes: 3.6 10*3/uL — ABNORMAL HIGH (ref 0.00–0.07)
Band Neutrophils: 17 %
Basophils Absolute: 0.7 10*3/uL — ABNORMAL HIGH (ref 0.0–0.1)
Basophils Relative: 5 %
Blasts: 1 %
Eosinophils Absolute: 0.3 10*3/uL (ref 0.0–0.5)
Eosinophils Relative: 2 %
HCT: 33.4 % — ABNORMAL LOW (ref 36.0–46.0)
Hemoglobin: 9.9 g/dL — ABNORMAL LOW (ref 12.0–15.0)
Lymphocytes Relative: 12 %
Lymphs Abs: 1.8 10*3/uL (ref 0.7–4.0)
MCH: 29.4 pg (ref 26.0–34.0)
MCHC: 29.6 g/dL — ABNORMAL LOW (ref 30.0–36.0)
MCV: 99.1 fL (ref 80.0–100.0)
Metamyelocytes Relative: 9 %
Monocytes Absolute: 0.3 10*3/uL (ref 0.1–1.0)
Monocytes Relative: 2 %
Myelocytes: 14 %
Neutro Abs: 8 10*3/uL — ABNORMAL HIGH (ref 1.7–7.7)
Neutrophils Relative %: 37 %
Platelets: 211 10*3/uL (ref 150–400)
Promyelocytes Relative: 1 %
RBC: 3.37 MIL/uL — ABNORMAL LOW (ref 3.87–5.11)
RDW: 20.5 % — ABNORMAL HIGH (ref 11.5–15.5)
Smear Review: NORMAL
WBC: 14.8 10*3/uL — ABNORMAL HIGH (ref 4.0–10.5)
nRBC: 8.6 % — ABNORMAL HIGH (ref 0.0–0.2)

## 2018-10-30 LAB — COMPREHENSIVE METABOLIC PANEL
ALT: 15 U/L (ref 0–44)
AST: 29 U/L (ref 15–41)
Albumin: 4 g/dL (ref 3.5–5.0)
Alkaline Phosphatase: 49 U/L (ref 38–126)
Anion gap: 9 (ref 5–15)
BUN: 14 mg/dL (ref 8–23)
CO2: 28 mmol/L (ref 22–32)
Calcium: 9 mg/dL (ref 8.9–10.3)
Chloride: 106 mmol/L (ref 98–111)
Creatinine, Ser: 0.84 mg/dL (ref 0.44–1.00)
GFR calc Af Amer: >60 mL/min (ref >60)
GFR calc non Af Amer: >60 mL/min (ref >60)
Glucose, Bld: 89 mg/dL (ref 70–99)
Potassium: 4.1 mmol/L (ref 3.5–5.1)
Sodium: 143 mmol/L (ref 135–145)
Total Bilirubin: 0.7 mg/dL (ref 0.3–1.2)
Total Protein: 6.8 g/dL (ref 6.5–8.1)

## 2018-11-26 ENCOUNTER — Other Ambulatory Visit: Payer: Self-pay

## 2018-11-26 DIAGNOSIS — Z1589 Genetic susceptibility to other disease: Secondary | ICD-10-CM

## 2018-11-26 NOTE — Progress Notes (Signed)
Patient is coming in for follow up, she is doing well no questions or concerns.   Call [name] [phone] please call during visit tomorrow.

## 2018-11-27 ENCOUNTER — Encounter: Payer: Self-pay | Admitting: Oncology

## 2018-11-27 ENCOUNTER — Inpatient Hospital Stay (HOSPITAL_BASED_OUTPATIENT_CLINIC_OR_DEPARTMENT_OTHER): Payer: Medicare Other | Admitting: Oncology

## 2018-11-27 ENCOUNTER — Other Ambulatory Visit: Payer: Self-pay

## 2018-11-27 ENCOUNTER — Inpatient Hospital Stay: Payer: Medicare Other | Attending: Oncology

## 2018-11-27 VITALS — BP 168/79 | HR 69 | Temp 96.5°F | Resp 18 | Wt 101.5 lb

## 2018-11-27 DIAGNOSIS — Z1589 Genetic susceptibility to other disease: Secondary | ICD-10-CM

## 2018-11-27 DIAGNOSIS — R161 Splenomegaly, not elsewhere classified: Secondary | ICD-10-CM | POA: Insufficient documentation

## 2018-11-27 DIAGNOSIS — Z79899 Other long term (current) drug therapy: Secondary | ICD-10-CM | POA: Diagnosis not present

## 2018-11-27 DIAGNOSIS — E278 Other specified disorders of adrenal gland: Secondary | ICD-10-CM | POA: Diagnosis not present

## 2018-11-27 DIAGNOSIS — D649 Anemia, unspecified: Secondary | ICD-10-CM | POA: Diagnosis not present

## 2018-11-27 DIAGNOSIS — D7581 Myelofibrosis: Secondary | ICD-10-CM | POA: Diagnosis not present

## 2018-11-27 DIAGNOSIS — I10 Essential (primary) hypertension: Secondary | ICD-10-CM | POA: Insufficient documentation

## 2018-11-27 DIAGNOSIS — D72829 Elevated white blood cell count, unspecified: Secondary | ICD-10-CM | POA: Diagnosis present

## 2018-11-27 LAB — CBC WITH DIFFERENTIAL/PLATELET
Abs Immature Granulocytes: 3.1 10*3/uL — ABNORMAL HIGH (ref 0.00–0.07)
Band Neutrophils: 17 %
Basophils Absolute: 0.4 10*3/uL — ABNORMAL HIGH (ref 0.0–0.1)
Basophils Relative: 3 %
Eosinophils Absolute: 0.1 10*3/uL (ref 0.0–0.5)
Eosinophils Relative: 1 %
HCT: 32.1 % — ABNORMAL LOW (ref 36.0–46.0)
Hemoglobin: 9.5 g/dL — ABNORMAL LOW (ref 12.0–15.0)
Lymphocytes Relative: 10 %
Lymphs Abs: 1.4 10*3/uL (ref 0.7–4.0)
MCH: 29.9 pg (ref 26.0–34.0)
MCHC: 29.6 g/dL — ABNORMAL LOW (ref 30.0–36.0)
MCV: 100.9 fL — ABNORMAL HIGH (ref 80.0–100.0)
Metamyelocytes Relative: 6 %
Monocytes Absolute: 0.3 10*3/uL (ref 0.1–1.0)
Monocytes Relative: 2 %
Myelocytes: 17 %
Neutro Abs: 8.3 10*3/uL — ABNORMAL HIGH (ref 1.7–7.7)
Neutrophils Relative %: 44 %
Platelets: 216 10*3/uL (ref 150–400)
RBC: 3.18 MIL/uL — ABNORMAL LOW (ref 3.87–5.11)
RDW: 20.9 % — ABNORMAL HIGH (ref 11.5–15.5)
Smear Review: ADEQUATE
WBC: 13.6 10*3/uL — ABNORMAL HIGH (ref 4.0–10.5)
nRBC: 10.2 % — ABNORMAL HIGH (ref 0.0–0.2)

## 2018-11-27 LAB — COMPREHENSIVE METABOLIC PANEL
ALT: 18 U/L (ref 0–44)
AST: 31 U/L (ref 15–41)
Albumin: 4.2 g/dL (ref 3.5–5.0)
Alkaline Phosphatase: 48 U/L (ref 38–126)
Anion gap: 6 (ref 5–15)
BUN: 16 mg/dL (ref 8–23)
CO2: 29 mmol/L (ref 22–32)
Calcium: 9 mg/dL (ref 8.9–10.3)
Chloride: 107 mmol/L (ref 98–111)
Creatinine, Ser: 0.64 mg/dL (ref 0.44–1.00)
GFR calc Af Amer: >60 mL/min (ref >60)
GFR calc non Af Amer: >60 mL/min (ref >60)
Glucose, Bld: 102 mg/dL — ABNORMAL HIGH (ref 70–99)
Potassium: 3.9 mmol/L (ref 3.5–5.1)
Sodium: 142 mmol/L (ref 135–145)
Total Bilirubin: 0.8 mg/dL (ref 0.3–1.2)
Total Protein: 6.8 g/dL (ref 6.5–8.1)

## 2018-11-27 NOTE — Progress Notes (Signed)
Hematology/Oncology Follow up note [facility] [phone] [phone]   Patient Care Team: Tracie Harrier, MD as PCP - General (Internal Medicine)  REFERRING PROVIDER: Tracie Harrier, MD  REASON FOR VISIT Follow up for treatment of Leukocytosis and a splenomegaly.   HISTORY OF PRESENTING ILLNESS:  [name] is a  85 y.o.  female with PMH listed below who was referred to me for evaluation of leukocytosis and splenomegaly. Patient was recently admitted to outside facility [facility] in [facility] on Jun 12, 2017 after she developed rectal bleeding.  CT was done during that visit which showed a splenomegaly, spleen enlarged at 14 cm.  There is an indeterminant 1.5 cm right adrenal mass.  Rectal bleeding was considered to be secondary to diverticulitis. Patient follows up with Dr. Ginette Pitman primary care physician. On being 29 2019.  CBC showed white count 14.3, hemoglobin 9.9, MCV 99.4, immature granulocytes 1.2, reticulocyte percentage 7.78 elevated, platelet count 386,000, differential showed neutrophilia, ANC 9.93, basophils 0.47,  Reviewed patient's previous lab work-up.  She has had leukocytosis since October 2018 with similar differential abnormalities. Patient has also reported easy bruising, no episodes of acute bleeding.  She also had weight loss about 12 pounds for the past 2 to 3 months.  Reports feeling fatigued.   11/06/2017 Bone marrow biopsy aspirate showed hypercellular bone marrow with features of myeloproliferative neoplasm.  Discussed with Dr. Gari Crown All aspirate material is suboptimal but "biopsy touch imprint in the peripheral blood mostly show features of myeloproliferative neoplasm.  Particular the primary myelofibrosis especially in conjunction with patient's previously documented Jak 2 mutation.  No apparent increasing blast cells is seen.  An iron stain performed on touch imprints showed scattered  offering centroblast.  Letter finding is of uncertain significance in this setting and may represent a dysplastic component.  Cytogenetics shows normal karyotype.  Foundation heme pending high molecular risk mutation status[ ASXL1, SRSF2, [id number], EZH2, IDH1/2] . GIPSS prognostic model: 1 point or more depending on foundation HEME results.  MIPSS70+ v2.0: She has constitutional symptoms which is 2 points, normal karyotype.  Status of high molecular risk mutational unknown Likely intermediate risk or higher.  # 12/07/2018 started on Jakafi 60m BID.   INTERVAL HISTORY Morgan SHIBUYAis a 85y.o. female who has above history reviewed by me today presents for follow up for primary myelofibrosis. Daughter Morgan Polingwas called during encounter.  Patient reports feeling well today. She has been on Jakafi 10 mg twice daily. Continues to have very good appetite. Weight has been stable.  Gained 2 pounds since last visit. Chronic fatigue is at baseline. Denies any new complaints today. Review of Systems  Constitutional: Positive for fatigue. Negative for appetite change, chills and fever.  HENT:   Negative for hearing loss and voice change.   Eyes: Negative for eye problems.  Respiratory: Negative for chest tightness and cough.   Cardiovascular: Negative for chest pain.  Gastrointestinal: Negative for abdominal distention, abdominal pain and blood in stool.  Endocrine: Negative for hot flashes.  Genitourinary: Negative for difficulty urinating and frequency.   Musculoskeletal: Negative for arthralgias.  Skin: Negative for itching and rash.  Neurological: Negative for extremity weakness.  Hematological: Negative for adenopathy.  Psychiatric/Behavioral: Negative for confusion.    MEDICAL HISTORY:  Past Medical History:  Diagnosis Date  . Aneurysm of anterior cerebral artery   . Blindness of left eye    secondary to cataract surgery complication  . Depression   .  Hypertension   . SAH  (subarachnoid hemorrhage) ([place]) 02/2006  . Tobacco abuse     SURGICAL HISTORY: Past Surgical History:  Procedure Laterality Date  . ABDOMINAL HYSTERECTOMY    . ANEURYSM COILING    . CATARACT EXTRACTION    . CHOLECYSTECTOMY      SOCIAL HISTORY: Social History   Socioeconomic History  . Marital status: Widowed    Spouse name: Not on file  . Number of children: 1  . Years of education: Not on file  . Highest education level: Not on file  Occupational History  . Not on file  Social Needs  . Financial resource strain: Not hard at all  . Food insecurity    Worry: Never true    Inability: Not on file  . Transportation needs    Medical: Patient refused    Non-medical: Patient refused  Tobacco Use  . Smoking status: Former Smoker    Quit date: 07/31/2015    Years since quitting: 3.3  . Smokeless tobacco: Never Used  Substance and Sexual Activity  . Alcohol use: Yes    Alcohol/week: 1.0 standard drinks    Types: 1 Glasses of wine per week    Comment: once a month  . Drug use: No  . Sexual activity: Not on file  Lifestyle  . Physical activity    Days per week: Patient refused    Minutes per session: Patient refused  . Stress: Not at all  Relationships  . Social Herbalist on phone: Not on file    Gets together: Not on file    Attends religious service: Not on file    Active member of club or organization: Not on file    Attends meetings of clubs or organizations: Not on file    Relationship status: Not on file  . Intimate partner violence    Fear of current or ex partner: Patient refused    Emotionally abused: Patient refused    Physically abused: Patient refused    Forced sexual activity: Patient refused  Other Topics Concern  . Not on file  Social History Narrative  . Not on file    FAMILY HISTORY: Family History  Problem Relation Age of Onset  . Breast cancer Daughter 29  . Breast cancer Sister   . Throat cancer Mother   . Lung cancer Father    . Prostate cancer Brother   . Leukemia Brother   . Prostate cancer Brother   . Leukemia Brother     ALLERGIES:  has No Known Allergies.  MEDICATIONS:  Current Outpatient Medications  Medication Sig Dispense Refill  . albuterol (PROVENTIL HFA;VENTOLIN HFA) 108 (90 Base) MCG/ACT inhaler Inhale 2 puffs into the lungs every 6 (six) hours as needed for wheezing or shortness of breath. 1 Inhaler 2  . ALPRAZolam (XANAX) 0.25 MG tablet Take 0.25 mg by mouth daily as needed.    Marland Kitchen amLODipine (NORVASC) 5 MG tablet Take 5 mg by mouth daily.    Marland Kitchen aspirin 81 MG tablet Take 81 mg by mouth daily.    . citalopram (CELEXA) 20 MG tablet Take 20 mg by mouth daily.    . ferrous sulfate 325 (65 FE) MG tablet Take 325 mg by mouth once a week. Take 1 tablet on Sundays.    . fluticasone (FLONASE) 50 MCG/ACT nasal spray Place into the nose.    Marland Kitchen JAKAFI 10 MG tablet TAKE 1 TABLET TWICE A DAY 60 tablet 12  . lisinopril (  PRINIVIL,ZESTRIL) 40 MG tablet Take 40 mg by mouth daily.    Marland Kitchen omeprazole (PRILOSEC) 20 MG capsule Take 20 mg by mouth every morning.    . Vitamin D, Ergocalciferol, (DRISDOL) 50000 units CAPS capsule Take 50,000 Units by mouth every Sunday.     No current facility-administered medications for this visit.      PHYSICAL EXAMINATION: ECOG PERFORMANCE STATUS: 1 - Symptomatic but completely ambulatory Vitals:   11/27/18 1002  BP: (!) 168/79  Pulse: 69  Resp: 18  Temp: (!) 96.5 F (35.8 C)   Filed Weights   11/27/18 1002  Weight: 101 lb 8 oz (46 kg)    Physical Exam Constitutional:      General: She is not in acute distress.    Comments: Thin, walk in   HENT:     Head: Normocephalic and atraumatic.  Eyes:     General: No scleral icterus.    Conjunctiva/sclera: Conjunctivae normal.     Pupils: Pupils are equal, round, and reactive to light.  Neck:     Musculoskeletal: Normal range of motion and neck supple.  Cardiovascular:     Rate and Rhythm: Normal rate and regular rhythm.      Heart sounds: Normal heart sounds.  Pulmonary:     Effort: Pulmonary effort is normal. No respiratory distress.     Breath sounds: Normal breath sounds. No wheezing or rales.  Chest:     Chest wall: No tenderness.  Abdominal:     General: Bowel sounds are normal. There is no distension.     Palpations: Abdomen is soft. There is no mass.     Tenderness: There is no abdominal tenderness.     Comments: No splenomegaly.   Musculoskeletal: Normal range of motion.        General: No deformity.  Lymphadenopathy:     Cervical: No cervical adenopathy.  Skin:    General: Skin is warm and dry.     Findings: No erythema or rash.  Neurological:     Mental Status: She is alert and oriented to person, place, and time.     Cranial Nerves: No cranial nerve deficit.     Coordination: Coordination normal.  Psychiatric:        Behavior: Behavior normal.        Thought Content: Thought content normal.      LABORATORY DATA:  I have reviewed the data as listed Lab Results  Component Value Date   WBC 13.6 (H) 11/27/2018   HGB 9.5 (L) 11/27/2018   HCT 32.1 (L) 11/27/2018   MCV 100.9 (H) 11/27/2018   PLT 216 11/27/2018   Recent Labs    10/02/18 0834 10/30/18 1306 11/27/18 0943  NA 143 143 142  K 3.9 4.1 3.9  CL 109 106 107  CO2 '28 28 29  ' GLUCOSE 98 89 102*  BUN '13 14 16  ' CREATININE 0.79 0.84 0.64  CALCIUM 9.0 9.0 9.0  GFRNONAA >60 >60 >60  GFRAA >60 >60 >60  PROT 6.9 6.8 6.8  ALBUMIN 4.1 4.0 4.2  AST '29 29 31  ' ALT '15 15 18  ' ALKPHOS 49 49 48  BILITOT 0.8 0.7 0.8    Peripheral blood flow cytometry showed neutrophilia with left shift, less than 1% myeloblasts and basophilia.  CBC and a phenotypic findings suggest a myeloproliferative disorder   # BCR ABL FISH negative. 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 Myelofibrosis ([place])   2. Adrenal mass 1 cm to  4 cm in diameter ([place])   3. Normocytic anemia   4. JAK-2 gene mutation    # JAK 2 V617 mutation  positive, myelofibrosis Labs are reviewed and discussed with patient. Counts acceptable to continue on Jakafi 10 mg twice daily.  #Anemia,  it is noted that her hemoglobin has gradually decreased.  Today's hemoglobin is 9.5. Discussed with patient and her daughter that this can be secondary to [facility] toxicities.  Need to be closely monitored. In the future, if hemoglobin continues to decrease, I would decrease Jakafi to 5 mg twice daily. Would also check vitamin B12 level, folate, iron panel at the next visit to rule out other etiologies contributing to anemia.  #Leukocytosis, improved compared to 4 weeks ago.  #Adrenal mass, patient had CT done approximately 1 year ago which showed splenomegaly 14 cm at that time, 1.5 indeterminate adrenal mass.  I would obtain CT abdomen adrenal protocol for follow-up.  Discussed with patient and daughter and both are in agreement.   We spent sufficient time to discuss many aspect of care, questions were answered to patient and daughters satisfaction. Repeat blood work in 1 month and follow-up in the office.  Orders Placed This Encounter  Procedures  . CT ADRENAL ABD WO    Standing Status:   Future    Standing Expiration Date:   02/27/2020    Order Specific Question:   Preferred imaging location?    Answer:   [facility]    Order Specific Question:   Is Oral Contrast requested for this exam?    Answer:   Yes, Per Radiology protocol    Order Specific Question:   Radiology Contrast Protocol - do NOT remove file path    Answer:   [email or web address]  . CBC with Differential/Platelet    Standing Status:   Future    Standing Expiration Date:   11/27/2019  . Comprehensive metabolic panel    Standing Status:   Future    Standing Expiration Date:   11/27/2019  . Ferritin    Standing Status:   Future    Standing Expiration Date:   11/27/2019  . Iron and TIBC    Standing Status:   Future    Standing Expiration Date:    11/27/2019  . Folate    Standing Status:   Future    Standing Expiration Date:   11/27/2019  . Vitamin B12    Standing Status:   Future    Standing Expiration Date:   11/27/2019    Earlie Server, MD, PhD Hematology Oncology [place] at [facility] 11/27/2018

## 2018-12-04 ENCOUNTER — Other Ambulatory Visit: Payer: Self-pay

## 2018-12-04 ENCOUNTER — Ambulatory Visit
Admission: RE | Admit: 2018-12-04 | Discharge: 2018-12-04 | Disposition: A | Discharge status: Home / Self Care | Payer: Medicare Other | Source: Ambulatory Visit | Attending: Oncology | Admitting: Oncology

## 2018-12-04 DIAGNOSIS — R161 Splenomegaly, not elsewhere classified: Secondary | ICD-10-CM | POA: Diagnosis not present

## 2018-12-04 DIAGNOSIS — I2510 Atherosclerotic heart disease of native coronary artery without angina pectoris: Secondary | ICD-10-CM | POA: Insufficient documentation

## 2018-12-04 DIAGNOSIS — M4856XA Collapsed vertebra, not elsewhere classified, lumbar region, initial encounter for fracture: Secondary | ICD-10-CM | POA: Diagnosis not present

## 2018-12-04 DIAGNOSIS — K449 Diaphragmatic hernia without obstruction or gangrene: Secondary | ICD-10-CM | POA: Diagnosis not present

## 2018-12-04 DIAGNOSIS — I700 Atherosclerosis of aorta: Secondary | ICD-10-CM | POA: Diagnosis not present

## 2018-12-04 DIAGNOSIS — D3501 Benign neoplasm of right adrenal gland: Secondary | ICD-10-CM | POA: Diagnosis not present

## 2018-12-04 DIAGNOSIS — D3502 Benign neoplasm of left adrenal gland: Secondary | ICD-10-CM | POA: Diagnosis not present

## 2018-12-04 DIAGNOSIS — E278 Other specified disorders of adrenal gland: Secondary | ICD-10-CM

## 2018-12-24 ENCOUNTER — Other Ambulatory Visit: Payer: Self-pay

## 2018-12-24 ENCOUNTER — Inpatient Hospital Stay: Payer: Medicare Other | Attending: Oncology

## 2018-12-24 DIAGNOSIS — D471 Chronic myeloproliferative disease: Secondary | ICD-10-CM | POA: Insufficient documentation

## 2018-12-24 DIAGNOSIS — D539 Nutritional anemia, unspecified: Secondary | ICD-10-CM | POA: Diagnosis not present

## 2018-12-24 DIAGNOSIS — D72829 Elevated white blood cell count, unspecified: Secondary | ICD-10-CM | POA: Diagnosis present

## 2018-12-24 DIAGNOSIS — Z1589 Genetic susceptibility to other disease: Secondary | ICD-10-CM

## 2018-12-24 DIAGNOSIS — I10 Essential (primary) hypertension: Secondary | ICD-10-CM | POA: Diagnosis not present

## 2018-12-24 DIAGNOSIS — D649 Anemia, unspecified: Secondary | ICD-10-CM

## 2018-12-24 LAB — CBC WITH DIFFERENTIAL/PLATELET
Abs Immature Granulocytes: 2.9 10*3/uL — ABNORMAL HIGH (ref 0.00–0.07)
Band Neutrophils: 6 %
Basophils Absolute: 0.6 10*3/uL — ABNORMAL HIGH (ref 0.0–0.1)
Basophils Relative: 4 %
Eosinophils Absolute: 0.3 10*3/uL (ref 0.0–0.5)
Eosinophils Relative: 2 %
HCT: 36.1 % (ref 36.0–46.0)
Hemoglobin: 10.7 g/dL — ABNORMAL LOW (ref 12.0–15.0)
Lymphocytes Relative: 12 %
Lymphs Abs: 1.7 10*3/uL (ref 0.7–4.0)
MCH: 29.6 pg (ref 26.0–34.0)
MCHC: 29.6 g/dL — ABNORMAL LOW (ref 30.0–36.0)
MCV: 100 fL (ref 80.0–100.0)
Metamyelocytes Relative: 5 %
Monocytes Absolute: 0.3 10*3/uL (ref 0.1–1.0)
Monocytes Relative: 2 %
Myelocytes: 12 %
Neutro Abs: 8.2 10*3/uL — ABNORMAL HIGH (ref 1.7–7.7)
Neutrophils Relative %: 53 %
Platelets: 308 10*3/uL (ref 150–400)
Promyelocytes Relative: 4 %
RBC: 3.61 MIL/uL — ABNORMAL LOW (ref 3.87–5.11)
RDW: 20.8 % — ABNORMAL HIGH (ref 11.5–15.5)
Smear Review: ADEQUATE
WBC: 13.9 10*3/uL — ABNORMAL HIGH (ref 4.0–10.5)
nRBC: 8.8 % — ABNORMAL HIGH (ref 0.0–0.2)

## 2018-12-24 LAB — IRON AND TIBC
Iron: 95 ug/dL (ref 28–170)
Saturation Ratios: 30 % (ref 10.4–31.8)
TIBC: 320 ug/dL (ref 250–450)
UIBC: 226 ug/dL

## 2018-12-24 LAB — COMPREHENSIVE METABOLIC PANEL
ALT: 17 U/L (ref 0–44)
AST: 31 U/L (ref 15–41)
Albumin: 4.1 g/dL (ref 3.5–5.0)
Alkaline Phosphatase: 55 U/L (ref 38–126)
Anion gap: 5 (ref 5–15)
BUN: 17 mg/dL (ref 8–23)
CO2: 28 mmol/L (ref 22–32)
Calcium: 8.8 mg/dL — ABNORMAL LOW (ref 8.9–10.3)
Chloride: 106 mmol/L (ref 98–111)
Creatinine, Ser: 0.74 mg/dL (ref 0.44–1.00)
GFR calc Af Amer: >60 mL/min (ref >60)
GFR calc non Af Amer: >60 mL/min (ref >60)
Glucose, Bld: 104 mg/dL — ABNORMAL HIGH (ref 70–99)
Potassium: 3.7 mmol/L (ref 3.5–5.1)
Sodium: 139 mmol/L (ref 135–145)
Total Bilirubin: 0.8 mg/dL (ref 0.3–1.2)
Total Protein: 6.8 g/dL (ref 6.5–8.1)

## 2018-12-24 LAB — FERRITIN: Ferritin: 57 ng/mL (ref 11–307)

## 2018-12-24 LAB — FOLATE: Folate: 13.1 ng/mL (ref >5.9)

## 2018-12-24 LAB — VITAMIN B12: Vitamin B-12: 284 pg/mL (ref 180–914)

## 2018-12-26 ENCOUNTER — Other Ambulatory Visit: Payer: Self-pay

## 2018-12-26 ENCOUNTER — Inpatient Hospital Stay (HOSPITAL_BASED_OUTPATIENT_CLINIC_OR_DEPARTMENT_OTHER): Payer: Medicare Other | Admitting: Oncology

## 2018-12-26 ENCOUNTER — Encounter: Payer: Self-pay | Admitting: Oncology

## 2018-12-26 ENCOUNTER — Telehealth: Payer: Self-pay

## 2018-12-26 VITALS — BP 195/80 | HR 60 | Temp 95.6°F | Resp 18 | Wt 102.2 lb

## 2018-12-26 DIAGNOSIS — D72829 Elevated white blood cell count, unspecified: Secondary | ICD-10-CM | POA: Diagnosis not present

## 2018-12-26 DIAGNOSIS — D7581 Myelofibrosis: Secondary | ICD-10-CM | POA: Diagnosis not present

## 2018-12-26 DIAGNOSIS — D649 Anemia, unspecified: Secondary | ICD-10-CM | POA: Diagnosis not present

## 2018-12-26 DIAGNOSIS — Z1589 Genetic susceptibility to other disease: Secondary | ICD-10-CM | POA: Diagnosis not present

## 2018-12-26 DIAGNOSIS — I10 Essential (primary) hypertension: Secondary | ICD-10-CM | POA: Diagnosis not present

## 2018-12-26 DIAGNOSIS — E278 Other specified disorders of adrenal gland: Secondary | ICD-10-CM

## 2018-12-26 NOTE — Progress Notes (Signed)
Patient here for follow up. No concerns voiced. Blood pressure elevated, per patient" bp stays high most of the time". Pt asymptomatic

## 2018-12-26 NOTE — Telephone Encounter (Signed)
Patient's blood pressure was elevated this morning and it seems to be and ongoing issue. Per MD request, I contacted Dr.Hande's (PCP) office to notify him. I spoke to [place], who will relay message to Dr. Linton Ham nurse.

## 2018-12-26 NOTE — Progress Notes (Signed)
Hematology/Oncology Follow up note [facility] [phone] [phone]   Patient Care Team: Tracie Harrier, MD as PCP - General (Internal Medicine)  REFERRING PROVIDER: Tracie Harrier, MD  REASON FOR VISIT Follow up for treatment of Leukocytosis and a splenomegaly.   HISTORY OF PRESENTING ILLNESS:  [name] is a  85 y.o.  female with PMH listed below who was referred to me for evaluation of leukocytosis and splenomegaly. Patient was recently admitted to outside facility [facility] in [facility] on Jun 12, 2017 after she developed rectal bleeding.  CT was done during that visit which showed a splenomegaly, spleen enlarged at 14 cm.  There is an indeterminant 1.5 cm right adrenal mass.  Rectal bleeding was considered to be secondary to diverticulitis. Patient follows up with Dr. Ginette Pitman primary care physician. On being 29 2019.  CBC showed white count 14.3, hemoglobin 9.9, MCV 99.4, immature granulocytes 1.2, reticulocyte percentage 7.78 elevated, platelet count 386,000, differential showed neutrophilia, ANC 9.93, basophils 0.47,  Reviewed patient's previous lab work-up.  She has had leukocytosis since October 2018 with similar differential abnormalities. Patient has also reported easy bruising, no episodes of acute bleeding.  She also had weight loss about 12 pounds for the past 2 to 3 months.  Reports feeling fatigued.   11/06/2017 Bone marrow biopsy aspirate showed hypercellular bone marrow with features of myeloproliferative neoplasm.  Discussed with Dr. Gari Crown All aspirate material is suboptimal but "biopsy touch imprint in the peripheral blood mostly show features of myeloproliferative neoplasm.  Particular the primary myelofibrosis especially in conjunction with patient's previously documented Jak 2 mutation.  No apparent increasing blast cells is seen.  An iron stain performed on touch imprints showed scattered  offering centroblast.  Letter finding is of uncertain significance in this setting and may represent a dysplastic component.  Cytogenetics shows normal karyotype.  Foundation heme pending high molecular risk mutation status[ ASXL1, SRSF2, [id number], EZH2, IDH1/2] . GIPSS prognostic model: 1 point or more depending on foundation HEME results.  MIPSS70+ v2.0: She has constitutional symptoms which is 2 points, normal karyotype.  Status of high molecular risk mutational unknown Likely intermediate risk or higher.  # 12/07/2018 started on Jakafi 77m BID.   INTERVAL HISTORY Morgan SONis a 85y.o. female who has above history reviewed by me today presents for follow up for primary myelofibrosis do not I tried to call patient's daughter during the encounter.  She did not pick up her phone. Patient reports feeling well.  Denies any new complaints today. Type remains good. Denies any pain.  Review of Systems  Constitutional: Positive for fatigue. Negative for appetite change, chills and fever.  HENT:   Negative for hearing loss and voice change.   Eyes: Negative for eye problems.  Respiratory: Negative for chest tightness and cough.   Cardiovascular: Negative for chest pain.  Gastrointestinal: Negative for abdominal distention, abdominal pain and blood in stool.  Endocrine: Negative for hot flashes.  Genitourinary: Negative for difficulty urinating and frequency.   Musculoskeletal: Negative for arthralgias.  Skin: Negative for itching and rash.  Neurological: Negative for extremity weakness.  Hematological: Negative for adenopathy.  Psychiatric/Behavioral: Negative for confusion.    MEDICAL HISTORY:  Past Medical History:  Diagnosis Date   Aneurysm of anterior cerebral artery    Blindness of left eye    secondary to cataract surgery complication   Depression    Hypertension    SAH (subarachnoid hemorrhage) ([place] 02/2006  Tobacco abuse     SURGICAL HISTORY: Past  Surgical History:  Procedure Laterality Date   ABDOMINAL HYSTERECTOMY     ANEURYSM COILING     CATARACT EXTRACTION     CHOLECYSTECTOMY      SOCIAL HISTORY: Social History   Socioeconomic History   Marital status: Widowed    Spouse name: Not on file   Number of children: 1   Years of education: Not on file   Highest education level: Not on file  Occupational History   Not on file  Social Needs   Financial resource strain: Not hard at all   Food insecurity    Worry: Never true    Inability: Not on file   Transportation needs    Medical: Patient refused    Non-medical: Patient refused  Tobacco Use   Smoking status: Former Smoker    Quit date: 07/31/2015    Years since quitting: 3.4   Smokeless tobacco: Never Used  Substance and Sexual Activity   Alcohol use: Yes    Alcohol/week: 1.0 standard drinks    Types: 1 Glasses of wine per week    Comment: once a month   Drug use: No   Sexual activity: Not on file  Lifestyle   Physical activity    Days per week: Patient refused    Minutes per session: Patient refused   Stress: Not at all  Relationships   Social connections    Talks on phone: Not on file    Gets together: Not on file    Attends religious service: Not on file    Active member of club or organization: Not on file    Attends meetings of clubs or organizations: Not on file    Relationship status: Not on file   Intimate partner violence    Fear of current or ex partner: Patient refused    Emotionally abused: Patient refused    Physically abused: Patient refused    Forced sexual activity: Patient refused  Other Topics Concern   Not on file  Social History Narrative   Not on file    FAMILY HISTORY: Family History  Problem Relation Age of Onset   Breast cancer Daughter 30   Breast cancer Sister    Throat cancer Mother    Lung cancer Father    Prostate cancer Brother    Leukemia Brother    Prostate cancer Brother     Leukemia Brother     ALLERGIES:  has No Known Allergies.  MEDICATIONS:  Current Outpatient Medications  Medication Sig Dispense Refill   amLODipine (NORVASC) 5 MG tablet Take 5 mg by mouth daily.     aspirin 81 MG tablet Take 81 mg by mouth daily.     citalopram (CELEXA) 20 MG tablet Take 20 mg by mouth daily.     diphenhydramine-acetaminophen (TYLENOL PM) 25-500 MG TABS tablet Take 1 tablet by mouth at bedtime as needed. Pt splits medication into 3     ferrous sulfate 325 (65 FE) MG tablet Take 325 mg by mouth once a week. Take 1 tablet on Sundays.     JAKAFI 10 MG tablet TAKE 1 TABLET TWICE A DAY 60 tablet 12   lisinopril (PRINIVIL,ZESTRIL) 40 MG tablet Take 40 mg by mouth daily.     Vitamin D, Ergocalciferol, (DRISDOL) 50000 units CAPS capsule Take 50,000 Units by mouth every Sunday.     albuterol (PROVENTIL HFA;VENTOLIN HFA) 108 (90 Base) MCG/ACT inhaler Inhale 2 puffs into the lungs every  6 (six) hours as needed for wheezing or shortness of breath. (Patient not taking: Reported on 12/26/2018) 1 Inhaler 2   ALPRAZolam (XANAX) 0.25 MG tablet Take 0.25 mg by mouth daily as needed.     fluticasone (FLONASE) 50 MCG/ACT nasal spray Place into the nose.     omeprazole (PRILOSEC) 20 MG capsule Take 20 mg by mouth every morning.     No current facility-administered medications for this visit.      PHYSICAL EXAMINATION: ECOG PERFORMANCE STATUS: 1 - Symptomatic but completely ambulatory Vitals:   12/26/18 1043  BP: (!) 195/80  Pulse: 60  Resp: 18  Temp: (!) 95.6 F (35.3 C)   Filed Weights   12/26/18 1043  Weight: 102 lb 3.2 oz (46.4 kg)    Physical Exam Constitutional:      General: She is not in acute distress.    Comments: Thin, walk independently.  HENT:     Head: Normocephalic and atraumatic.  Eyes:     General: No scleral icterus.    Conjunctiva/sclera: Conjunctivae normal.     Pupils: Pupils are equal, round, and reactive to light.  Neck:      Musculoskeletal: Normal range of motion and neck supple.  Cardiovascular:     Rate and Rhythm: Normal rate and regular rhythm.     Heart sounds: Normal heart sounds.  Pulmonary:     Effort: Pulmonary effort is normal. No respiratory distress.     Breath sounds: Normal breath sounds. No wheezing or rales.  Chest:     Chest wall: No tenderness.  Abdominal:     General: Bowel sounds are normal. There is no distension.     Palpations: Abdomen is soft. There is no mass.     Tenderness: There is no abdominal tenderness.     Comments: No splenomegaly.   Musculoskeletal: Normal range of motion.        General: No deformity.  Lymphadenopathy:     Cervical: No cervical adenopathy.  Skin:    General: Skin is warm and dry.     Findings: No erythema or rash.  Neurological:     Mental Status: She is alert and oriented to person, place, and time.     Cranial Nerves: No cranial nerve deficit.     Coordination: Coordination normal.  Psychiatric:        Behavior: Behavior normal.        Thought Content: Thought content normal.      LABORATORY DATA:  I have reviewed the data as listed Lab Results  Component Value Date   WBC 13.9 (H) 12/24/2018   HGB 10.7 (L) 12/24/2018   HCT 36.1 12/24/2018   MCV 100.0 12/24/2018   PLT 308 12/24/2018   Recent Labs    10/30/18 1306 11/27/18 0943 12/24/18 1135  NA 143 142 139  K 4.1 3.9 3.7  CL 106 107 106  CO2 '28 29 28  ' GLUCOSE 89 102* 104*  BUN '14 16 17  ' CREATININE 0.84 0.64 0.74  CALCIUM 9.0 9.0 8.8*  GFRNONAA >60 >60 >60  GFRAA >60 >60 >60  PROT 6.8 6.8 6.8  ALBUMIN 4.0 4.2 4.1  AST '29 31 31  ' ALT '15 18 17  ' ALKPHOS 49 48 55  BILITOT 0.7 0.8 0.8    Peripheral blood flow cytometry showed neutrophilia with left shift, less than 1% myeloblasts and basophilia.  CBC and a phenotypic findings suggest a myeloproliferative disorder   # BCR ABL FISH negative. # Jak 2 V617 mutation  positive  RADIOGRAPHIC STUDIES: I have personally reviewed  the radiological images as listed and agreed with the findings in the report. US Venous Img Lower Unilateral Left  Result Date: 10/03/2018 CLINICAL DATA:  Left lower extremity edema EXAM: LEFT LOWER EXTREMITY VENOUS DOPPLER ULTRASOUND TECHNIQUE: Gray-scale sonography with graded compression, as well as color Doppler and duplex ultrasound were performed to evaluate the lower extremity deep venous systems from the level of the common femoral vein and including the common femoral, femoral, profunda femoral, popliteal and calf veins including the posterior tibial, peroneal and gastrocnemius veins when visible. The superficial great saphenous vein was also interrogated. Spectral Doppler was utilized to evaluate flow at rest and with distal augmentation maneuvers in the common femoral, femoral and popliteal veins. COMPARISON:  None. FINDINGS: Contralateral Common Femoral Vein: Respiratory phasicity is normal and symmetric with the symptomatic side. No evidence of thrombus. Normal compressibility. Common Femoral Vein: No evidence of thrombus. Normal compressibility, respiratory phasicity and response to augmentation. Saphenofemoral Junction: No evidence of thrombus. Normal compressibility and flow on color Doppler imaging. Profunda Femoral Vein: No evidence of thrombus. Normal compressibility and flow on color Doppler imaging. Femoral Vein: No evidence of thrombus. Normal compressibility, respiratory phasicity and response to augmentation. Popliteal Vein: No evidence of thrombus. Normal compressibility, respiratory phasicity and response to augmentation. Calf Veins: Limited visualization of the calf veins. No significant thrombus appreciated. Superficial Great Saphenous Vein: No evidence of thrombus. Normal compressibility. Venous Reflux:  Not assessed Other Findings: Peripheral edema noted. Prominent fatty replaced benign left inguinal lymph node. IMPRESSION: No significant left lower extremity DVT by ultrasound.  Limited assessment of the calf veins. Electronically Signed   By: Jerilynn Mages.  Shick M.D.   On: 10/03/2018 10:30   Ct Adrenal Abd Wo  Result Date: 12/04/2018 CLINICAL DATA:  Follow-up right adrenal mass, reportedly 1.5 cm on outside CT imaging. No reported history of malignancy. EXAM: CT ABDOMEN WITHOUT CONTRAST TECHNIQUE: Multidetector CT imaging of the abdomen was performed following the standard protocol without IV contrast. COMPARISON:  None. FINDINGS: Lower chest: No significant pulmonary nodules or acute consolidative airspace disease. Coronary atherosclerosis. Hepatobiliary: Top-normal liver size. No liver mass. Cholecystectomy. No biliary ductal dilatation. Pancreas: Normal, with no mass or duct dilation. Spleen: Mild-to-moderate splenomegaly (craniocaudal splenic length 14.9 cm). No splenic mass. Adrenals/Urinary Tract: Right adrenal 1.8 cm nodule with density -2 HU, compatible with a benign adenoma. Left adrenal 1.4 cm nodule with density 5 HU, compatible with a benign adenoma. Parapelvic renal cyst in the central right renal sinus. No hydronephrosis. No renal stones. Simple exophytic 5.0 cm posterior upper right renal cyst. No additional contour deforming renal masses. Stomach/Bowel: Small hiatal hernia. Otherwise normal nondistended stomach. Visualized small and large bowel is normal caliber, with no bowel wall thickening. Vascular/Lymphatic: Atherosclerotic nonaneurysmal abdominal aorta. No pathologically enlarged lymph nodes in the abdomen. Other: No pneumoperitoneum, ascites or focal fluid collection. Musculoskeletal: No aggressive appearing focal osseous lesions. Moderate to severe L1 vertebral compression fracture, chronic appearing. Marked lumbar spondylosis. IMPRESSION: 1. Benign bilateral adrenal adenomas. 2. Mild-to-moderate splenomegaly. 3. Small hiatal hernia. 4. Moderate to severe L1 vertebral compression fracture, chronic appearing. 5. Coronary atherosclerosis. 6.  Aortic Atherosclerosis  (ICD10-I70.0). Electronically Signed   By: Ilona Sorrel M.D.   On: 12/04/2018 13:07     ASSESSMENT & PLAN:  1. JAK-2 gene mutation   2. Myelofibrosis ([place])   3. Normocytic anemia   4. Uncontrolled hypertension   5. Adrenal mass 1 cm to 4 cm in diameter [facility])    #  JAK 2 V617 mutation positive, myelofibrosis Labs reviewed and discussed with patient. Counts are stable and acceptable to continue Jakafi 10 mg twice daily.  #Chronic macrocytic anemia, Hemoglobin stable.  At 10.7. Continue to monitor. Vitamin B12 and folate levels are within without contrast limits.  #Uncontrolled hypertension, this is a chronic problem for her.  She is asymptomatic.  She does not want to go to emergency room for blood pressure management. I encourage patient to discuss with primary care provider for further adjustment of blood pressure medication.  #Leukocytosis, stable. #Adrenal mass, no need for additional work-up.   12/04/2018 CT abdomen adrenal protocol showed benign bilateral adrenal adenoma.  Mild to moderate splenomegaly.  Chronic hiatal hernia and chronic compression fracture of L1. Coronary atherosclerosis.  Aortic atherosclerosis. Images were independently viewed by me and discussed with patient.    We spent sufficient time to discuss many aspect of care, questions were answered to patient and daughters satisfaction. Repeat blood work in 6 months.  And follow-up in the office.  Orders Placed This Encounter  Procedures   CBC with Differential    Standing Status:   Future    Standing Expiration Date:   12/26/2019   Comprehensive metabolic panel    Standing Status:   Future    Standing Expiration Date:   12/26/2019    Earlie Server, MD, PhD Hematology Oncology [place] [facility] 12/26/2018

## 2019-01-08 ENCOUNTER — Other Ambulatory Visit: Payer: Self-pay | Admitting: Oncology

## 2019-01-08 DIAGNOSIS — D7581 Myelofibrosis: Secondary | ICD-10-CM

## 2019-01-30 ENCOUNTER — Other Ambulatory Visit: Payer: Self-pay | Admitting: *Deleted

## 2019-01-30 DIAGNOSIS — D7581 Myelofibrosis: Secondary | ICD-10-CM

## 2019-01-30 MED ORDER — JAKAFI 10 MG PO TABS: 10.0000 mg | ORAL_TABLET | Freq: Two times a day (BID) | ORAL | 12 refills | Status: DC

## 2019-02-05 ENCOUNTER — Inpatient Hospital Stay: Payer: Medicare Other | Attending: Oncology

## 2019-02-05 ENCOUNTER — Other Ambulatory Visit: Payer: Self-pay

## 2019-02-05 DIAGNOSIS — D7581 Myelofibrosis: Secondary | ICD-10-CM | POA: Diagnosis present

## 2019-02-05 DIAGNOSIS — Z1589 Genetic susceptibility to other disease: Secondary | ICD-10-CM

## 2019-02-05 LAB — COMPREHENSIVE METABOLIC PANEL
ALT: 26 U/L (ref 0–44)
AST: 33 U/L (ref 15–41)
Albumin: 4.5 g/dL (ref 3.5–5.0)
Alkaline Phosphatase: 58 U/L (ref 38–126)
Anion gap: 8 (ref 5–15)
BUN: 14 mg/dL (ref 8–23)
CO2: 29 mmol/L (ref 22–32)
Calcium: 9.4 mg/dL (ref 8.9–10.3)
Chloride: 106 mmol/L (ref 98–111)
Creatinine, Ser: 0.81 mg/dL (ref 0.44–1.00)
GFR calc Af Amer: >60 mL/min (ref >60)
GFR calc non Af Amer: >60 mL/min (ref >60)
Glucose, Bld: 93 mg/dL (ref 70–99)
Potassium: 4.3 mmol/L (ref 3.5–5.1)
Sodium: 143 mmol/L (ref 135–145)
Total Bilirubin: 0.9 mg/dL (ref 0.3–1.2)
Total Protein: 7.6 g/dL (ref 6.5–8.1)

## 2019-02-05 LAB — CBC WITH DIFFERENTIAL/PLATELET
Abs Immature Granulocytes: 3.5 10*3/uL — ABNORMAL HIGH (ref 0.00–0.07)
Band Neutrophils: 9 %
Basophils Absolute: 0.4 10*3/uL — ABNORMAL HIGH (ref 0.0–0.1)
Basophils Relative: 3 %
Eosinophils Absolute: 0.3 10*3/uL (ref 0.0–0.5)
Eosinophils Relative: 2 %
HCT: 38.7 % (ref 36.0–46.0)
Hemoglobin: 11.3 g/dL — ABNORMAL LOW (ref 12.0–15.0)
Lymphocytes Relative: 16 %
Lymphs Abs: 2.3 10*3/uL (ref 0.7–4.0)
MCH: 29.5 pg (ref 26.0–34.0)
MCHC: 29.2 g/dL — ABNORMAL LOW (ref 30.0–36.0)
MCV: 101 fL — ABNORMAL HIGH (ref 80.0–100.0)
Metamyelocytes Relative: 9 %
Monocytes Absolute: 0.9 10*3/uL (ref 0.1–1.0)
Monocytes Relative: 6 %
Myelocytes: 15 %
Neutro Abs: 7.1 10*3/uL (ref 1.7–7.7)
Neutrophils Relative %: 40 %
Platelets: 272 10*3/uL (ref 150–400)
RBC: 3.83 MIL/uL — ABNORMAL LOW (ref 3.87–5.11)
RDW: 20.8 % — ABNORMAL HIGH (ref 11.5–15.5)
Smear Review: ADEQUATE
WBC: 14.4 10*3/uL — ABNORMAL HIGH (ref 4.0–10.5)
nRBC: 6 % — ABNORMAL HIGH (ref 0.0–0.2)

## 2019-02-06 ENCOUNTER — Inpatient Hospital Stay (HOSPITAL_BASED_OUTPATIENT_CLINIC_OR_DEPARTMENT_OTHER): Payer: Medicare Other | Admitting: Oncology

## 2019-02-06 ENCOUNTER — Encounter: Payer: Self-pay | Admitting: Oncology

## 2019-02-06 ENCOUNTER — Inpatient Hospital Stay: Payer: Medicare Other

## 2019-02-06 DIAGNOSIS — D7581 Myelofibrosis: Secondary | ICD-10-CM | POA: Diagnosis not present

## 2019-02-06 DIAGNOSIS — Z1589 Genetic susceptibility to other disease: Secondary | ICD-10-CM | POA: Diagnosis not present

## 2019-02-06 DIAGNOSIS — D649 Anemia, unspecified: Secondary | ICD-10-CM

## 2019-02-06 DIAGNOSIS — E278 Other specified disorders of adrenal gland: Secondary | ICD-10-CM | POA: Diagnosis not present

## 2019-02-06 NOTE — Progress Notes (Signed)
HEMATOLOGY-ONCOLOGY TeleHEALTH VISIT PROGRESS NOTE  I connected with Blenda Peals on 02/06/19 at 10:00 AM EST by video enabled telemedicine visit and verified that I am speaking with the correct person using two identifiers. I discussed the limitations, risks, security and privacy concerns of performing an evaluation and management service by telemedicine and the availability of in-person appointments. I also discussed with the patient that there may be a patient responsible charge related to this service. The patient expressed understanding and agreed to proceed.   Other persons participating in the visit and their role in the encounter:  Daughter [name] to help patient to set up virtual visit Patient's location: Home  Provider's location: office Chief Complaint: Follow-up for myelofibrosis   INTERVAL HISTORY [name] is a 85 y.o. female who has above history reviewed by me today presents for follow up visit for management of myelofibrosis Problems and complaints are listed below:  Patient reports doing well at baseline.  She has good appetite Weight has been stable.  Patient reports that she has gained a pound recently. I attempted to connect the patient for visual enabled telehealth visit .  Due to the technical difficulties with video,  Patient was transitioned to audio only visit. Patient has no new complaints today.  Review of Systems  Constitutional: Negative for appetite change, chills, fatigue and fever.  HENT:   Negative for hearing loss and voice change.   Eyes: Negative for eye problems.  Respiratory: Negative for chest tightness and cough.   Cardiovascular: Negative for chest pain.  Gastrointestinal: Negative for abdominal distention, abdominal pain and blood in stool.  Endocrine: Negative for hot flashes.  Genitourinary: Negative for difficulty urinating and frequency.   Musculoskeletal: Negative for arthralgias.  Skin: Negative for itching and rash.   Neurological: Negative for extremity weakness.  Hematological: Negative for adenopathy.  Psychiatric/Behavioral: Negative for confusion.    Past Medical History:  Diagnosis Date  . Aneurysm of anterior cerebral artery   . Blindness of left eye    secondary to cataract surgery complication  . Depression   . Hypertension   . SAH (subarachnoid hemorrhage) ([place]) 02/2006  . Tobacco abuse    Past Surgical History:  Procedure Laterality Date  . ABDOMINAL HYSTERECTOMY    . ANEURYSM COILING    . CATARACT EXTRACTION    . CHOLECYSTECTOMY      Family History  Problem Relation Age of Onset  . Breast cancer Daughter 69  . Breast cancer Sister   . Throat cancer Mother   . Lung cancer Father   . Prostate cancer Brother   . Leukemia Brother   . Prostate cancer Brother   . Leukemia Brother     Social History   Socioeconomic History  . Marital status: Widowed    Spouse name: Not on file  . Number of children: 1  . Years of education: Not on file  . Highest education level: Not on file  Occupational History  . Not on file  Tobacco Use  . Smoking status: Former Smoker    Quit date: 07/31/2015    Years since quitting: 3.5  . Smokeless tobacco: Never Used  Substance and Sexual Activity  . Alcohol use: Yes    Alcohol/week: 1.0 standard drinks    Types: 1 Glasses of wine per week    Comment: once a month  . Drug use: No  . Sexual activity: Not on file  Other Topics Concern  . Not on file  Social History Narrative  .  Not on file   Social Determinants of Health   Financial Resource Strain:   . Difficulty of Paying Living Expenses: Not on file  Food Insecurity:   . Worried About Charity fundraiser in the Last Year: Not on file  . Ran Out of Food in the Last Year: Not on file  Transportation Needs:   . Lack of Transportation (Medical): Not on file  . Lack of Transportation (Non-Medical): Not on file  Physical Activity:   . Days of Exercise per Week: Not on file  . Minutes  of Exercise per Session: Not on file  Stress:   . Feeling of Stress : Not on file  Social Connections:   . Frequency of Communication with Friends and Family: Not on file  . Frequency of Social Gatherings with Friends and Family: Not on file  . Attends Religious Services: Not on file  . Active Member of Clubs or Organizations: Not on file  . Attends Archivist Meetings: Not on file  . Marital Status: Not on file  Intimate Partner Violence:   . Fear of Current or Ex-Partner: Not on file  . Emotionally Abused: Not on file  . Physically Abused: Not on file  . Sexually Abused: Not on file    Current Outpatient Medications on File Prior to Visit  Medication Sig Dispense Refill  . ALPRAZolam (XANAX) 0.25 MG tablet Take 0.25 mg by mouth daily as needed.    Marland Kitchen amLODipine (NORVASC) 5 MG tablet Take 5 mg by mouth daily.    Marland Kitchen aspirin 81 MG tablet Take 81 mg by mouth daily.    . citalopram (CELEXA) 20 MG tablet Take 20 mg by mouth daily.    . diphenhydramine-acetaminophen (TYLENOL PM) 25-500 MG TABS tablet Take 1 tablet by mouth at bedtime as needed. Pt splits medication into 3    . ferrous sulfate 325 (65 FE) MG tablet Take 325 mg by mouth once a week. Take 1 tablet on Sundays.    Marland Kitchen JAKAFI 10 MG tablet Take 1 tablet (10 mg total) by mouth 2 (two) times daily. 60 tablet 12  . lisinopril (PRINIVIL,ZESTRIL) 40 MG tablet Take 40 mg by mouth daily.    Marland Kitchen omeprazole (PRILOSEC) 20 MG capsule Take 20 mg by mouth every morning.    . Vitamin D, Ergocalciferol, (DRISDOL) 50000 units CAPS capsule Take 50,000 Units by mouth every Sunday.    Marland Kitchen albuterol (PROVENTIL HFA;VENTOLIN HFA) 108 (90 Base) MCG/ACT inhaler Inhale 2 puffs into the lungs every 6 (six) hours as needed for wheezing or shortness of breath. (Patient not taking: Reported on 12/26/2018) 1 Inhaler 2   No current facility-administered medications on file prior to visit.    No Known Allergies     Observations/Objective: Today's Vitals    02/06/19 1005  PainSc: 0-No pain   There is no height or weight on file to calculate BMI.  Physical Exam  Neurological: She is alert.  Psychiatric: Mood normal.    CBC    Component Value Date/Time   WBC 14.4 (H) 02/05/2019 1101   RBC 3.83 (L) 02/05/2019 1101   HGB 11.3 (L) 02/05/2019 1101   HCT 38.7 02/05/2019 1101   PLT 272 02/05/2019 1101   MCV 101.0 (H) 02/05/2019 1101   MCH 29.5 02/05/2019 1101   MCHC 29.2 (L) 02/05/2019 1101   RDW 20.8 (H) 02/05/2019 1101   LYMPHSABS 2.3 02/05/2019 1101   MONOABS 0.9 02/05/2019 1101   EOSABS 0.3 02/05/2019 1101  BASOSABS 0.4 (H) 02/05/2019 1101    CMP     Component Value Date/Time   NA 143 02/05/2019 1101   K 4.3 02/05/2019 1101   CL 106 02/05/2019 1101   CO2 29 02/05/2019 1101   GLUCOSE 93 02/05/2019 1101   BUN 14 02/05/2019 1101   CREATININE 0.81 02/05/2019 1101   CALCIUM 9.4 02/05/2019 1101   PROT 7.6 02/05/2019 1101   ALBUMIN 4.5 02/05/2019 1101   AST 33 02/05/2019 1101   ALT 26 02/05/2019 1101   ALKPHOS 58 02/05/2019 1101   BILITOT 0.9 02/05/2019 1101   GFRNONAA >60 02/05/2019 1101   GFRAA >60 02/05/2019 1101     Assessment and Plan: 1. Myelofibrosis ([place])   2. JAK-2 gene mutation   3. Normocytic anemia   4. Adrenal mass 1 cm to 4 cm in diameter ([facility])     Primary myelofibrosis, JAK2 positive. Patient has been clinically doing well on Jakafi 10 mg daily. Labs are reviewed and discussed with patient.  Counts acceptable. Continue Jakafi 10 mg daily.  Normocytic anemia, hemoglobin has improved.  Continue to monitor. #Adrenal mass, no need for additional work-up.   12/04/2018 CT abdomen adrenal protocol showed benign bilateral adrenal adenoma.  Mild to moderate splenomegaly.   Follow Up Instructions: 3 months   I discussed the assessment and treatment plan with the patient. The patient was provided an opportunity to ask questions and all were answered. The patient agreed with the plan and demonstrated an  understanding of the instructions.  The patient was advised to call back or seek an in-person evaluation if the symptoms worsen or if the condition fails to improve as anticipated.    Earlie Server, MD 02/06/2019 10:36 AM

## 2019-02-06 NOTE — Progress Notes (Signed)
Patient verified using two identifiers for virtual visit via telephone today.  Patient does not offer any problems today.  

## 2019-03-01 DIAGNOSIS — Z720 Tobacco use: Secondary | ICD-10-CM | POA: Insufficient documentation

## 2019-05-06 ENCOUNTER — Inpatient Hospital Stay: Payer: Medicare Other | Attending: Oncology

## 2019-05-06 ENCOUNTER — Inpatient Hospital Stay (HOSPITAL_BASED_OUTPATIENT_CLINIC_OR_DEPARTMENT_OTHER): Payer: Medicare Other | Admitting: Oncology

## 2019-05-06 ENCOUNTER — Encounter: Payer: Self-pay | Admitting: Oncology

## 2019-05-06 VITALS — BP 179/77 | HR 67 | Temp 95.1°F | Resp 16 | Wt 95.1 lb

## 2019-05-06 DIAGNOSIS — F329 Major depressive disorder, single episode, unspecified: Secondary | ICD-10-CM | POA: Insufficient documentation

## 2019-05-06 DIAGNOSIS — Z87891 Personal history of nicotine dependence: Secondary | ICD-10-CM | POA: Diagnosis not present

## 2019-05-06 DIAGNOSIS — D471 Chronic myeloproliferative disease: Secondary | ICD-10-CM | POA: Diagnosis present

## 2019-05-06 DIAGNOSIS — I700 Atherosclerosis of aorta: Secondary | ICD-10-CM | POA: Diagnosis not present

## 2019-05-06 DIAGNOSIS — Z1589 Genetic susceptibility to other disease: Secondary | ICD-10-CM

## 2019-05-06 DIAGNOSIS — I2510 Atherosclerotic heart disease of native coronary artery without angina pectoris: Secondary | ICD-10-CM | POA: Diagnosis not present

## 2019-05-06 DIAGNOSIS — Z79899 Other long term (current) drug therapy: Secondary | ICD-10-CM | POA: Insufficient documentation

## 2019-05-06 DIAGNOSIS — I10 Essential (primary) hypertension: Secondary | ICD-10-CM | POA: Diagnosis not present

## 2019-05-06 DIAGNOSIS — D7581 Myelofibrosis: Secondary | ICD-10-CM

## 2019-05-06 DIAGNOSIS — R161 Splenomegaly, not elsewhere classified: Secondary | ICD-10-CM | POA: Insufficient documentation

## 2019-05-06 DIAGNOSIS — R634 Abnormal weight loss: Secondary | ICD-10-CM | POA: Diagnosis not present

## 2019-05-06 LAB — CBC WITH DIFFERENTIAL/PLATELET
Abs Immature Granulocytes: 3.9 10*3/uL — ABNORMAL HIGH (ref 0.00–0.07)
Band Neutrophils: 14 %
Basophils Absolute: 0.4 10*3/uL — ABNORMAL HIGH (ref 0.0–0.1)
Basophils Relative: 2 %
Eosinophils Absolute: 0.4 10*3/uL (ref 0.0–0.5)
Eosinophils Relative: 2 %
HCT: 38.8 % (ref 36.0–46.0)
Hemoglobin: 11.7 g/dL — ABNORMAL LOW (ref 12.0–15.0)
Lymphocytes Relative: 11 %
Lymphs Abs: 2 10*3/uL (ref 0.7–4.0)
MCH: 30.1 pg (ref 26.0–34.0)
MCHC: 30.2 g/dL (ref 30.0–36.0)
MCV: 99.7 fL (ref 80.0–100.0)
Metamyelocytes Relative: 9 %
Monocytes Absolute: 0.4 10*3/uL (ref 0.1–1.0)
Monocytes Relative: 2 %
Myelocytes: 13 %
Neutro Abs: 10.9 10*3/uL — ABNORMAL HIGH (ref 1.7–7.7)
Neutrophils Relative %: 47 %
Platelets: 239 10*3/uL (ref 150–400)
RBC: 3.89 MIL/uL (ref 3.87–5.11)
RDW: 20.5 % — ABNORMAL HIGH (ref 11.5–15.5)
Smear Review: ADEQUATE
WBC: 17.9 10*3/uL — ABNORMAL HIGH (ref 4.0–10.5)
nRBC: 3 % — ABNORMAL HIGH (ref 0.0–0.2)

## 2019-05-06 LAB — COMPREHENSIVE METABOLIC PANEL
ALT: 34 U/L (ref 0–44)
AST: 57 U/L — ABNORMAL HIGH (ref 15–41)
Albumin: 3.8 g/dL (ref 3.5–5.0)
Alkaline Phosphatase: 128 U/L — ABNORMAL HIGH (ref 38–126)
Anion gap: 7 (ref 5–15)
BUN: 18 mg/dL (ref 8–23)
CO2: 27 mmol/L (ref 22–32)
Calcium: 9 mg/dL (ref 8.9–10.3)
Chloride: 108 mmol/L (ref 98–111)
Creatinine, Ser: 1.06 mg/dL — ABNORMAL HIGH (ref 0.44–1.00)
GFR calc Af Amer: 54 mL/min — ABNORMAL LOW (ref >60)
GFR calc non Af Amer: 47 mL/min — ABNORMAL LOW (ref >60)
Glucose, Bld: 102 mg/dL — ABNORMAL HIGH (ref 70–99)
Potassium: 4.5 mmol/L (ref 3.5–5.1)
Sodium: 142 mmol/L (ref 135–145)
Total Bilirubin: 0.8 mg/dL (ref 0.3–1.2)
Total Protein: 7.2 g/dL (ref 6.5–8.1)

## 2019-05-06 LAB — TECHNOLOGIST SMEAR REVIEW

## 2019-05-06 NOTE — Progress Notes (Signed)
Hematology/Oncology Follow up note [facility] [phone] [phone]   Patient Care Team: Tracie Harrier, MD as PCP - General (Internal Medicine)  REFERRING PROVIDER: Tracie Harrier, MD  REASON FOR VISIT Follow up for treatment of myelofibrosis  HISTORY OF PRESENTING ILLNESS:  [name] is a  86 y.o.  female with PMH listed below who was referred to me for evaluation of leukocytosis and splenomegaly. Patient was recently admitted to outside facility [facility] in [facility] on Jun 12, 2017 after she developed rectal bleeding.  CT was done during that visit which showed a splenomegaly, spleen enlarged at 14 cm.  There is an indeterminant 1.5 cm right adrenal mass.  Rectal bleeding was considered to be secondary to diverticulitis. Patient follows up with Dr. Ginette Pitman primary care physician. On being 29 2019.  CBC showed white count 14.3, hemoglobin 9.9, MCV 99.4, immature granulocytes 1.2, reticulocyte percentage 7.78 elevated, platelet count 386,000, differential showed neutrophilia, ANC 9.93, basophils 0.47,  Reviewed patient's previous lab work-up.  She has had leukocytosis since October 2018 with similar differential abnormalities. Patient has also reported easy bruising, no episodes of acute bleeding.  She also had weight loss about 12 pounds for the past 2 to 3 months.  Reports feeling fatigued.   11/06/2017 Bone marrow biopsy aspirate showed hypercellular bone marrow with features of myeloproliferative neoplasm.  Discussed with Dr. Gari Crown All aspirate material is suboptimal but "biopsy touch imprint in the peripheral blood mostly show features of myeloproliferative neoplasm.  Particular the primary myelofibrosis especially in conjunction with patient's previously documented Jak 2 mutation.  No apparent increasing blast cells is seen.  An iron stain performed on touch imprints showed scattered offering centroblast.   Letter finding is of uncertain significance in this setting and may represent a dysplastic component.  Cytogenetics shows normal karyotype.  Foundation heme pending high molecular risk mutation status[ ASXL1, SRSF2, A6007029, EZH2, IDH1/2] . GIPSS prognostic model: 1 point or more depending on foundation HEME results.  MIPSS70+ v2.0: She has constitutional symptoms which is 2 points, normal karyotype.  Status of high molecular risk mutational unknown Likely intermediate risk or higher.  # 12/06/2017 started on Jakafi 68m BID.   #Adrenal mass, no need for additional work-up.   12/04/2018 CT abdomen adrenal protocol showed benign bilateral adrenal adenoma.  Mild to moderate splenomegaly.  Chronic hiatal hernia and chronic compression fracture of L1. Coronary atherosclerosis.  Aortic atherosclerosis. Images were independently viewed by me and discussed with patient.   INTERVAL HISTORY Morgan NATZKEis a 86y.o. female who has above history reviewed by me today presents for follow up for primary myelofibrosis  Patient was accompanied by her daughter. Patient reports having good appetite.   She has had a few video visits with me due to COVID-19 pandemic. She seems to have lost weight comparing to November 2020. Otherwise no new complaints.  Review of Systems  Constitutional: Positive for fatigue. Negative for appetite change, chills and fever.  HENT:   Negative for hearing loss and voice change.   Eyes: Negative for eye problems.  Respiratory: Negative for chest tightness and cough.   Cardiovascular: Negative for chest pain.  Gastrointestinal: Negative for abdominal distention, abdominal pain and blood in stool.  Endocrine: Negative for hot flashes.  Genitourinary: Negative for difficulty urinating and frequency.   Musculoskeletal: Negative for arthralgias.  Skin: Negative for itching and rash.  Neurological: Negative for extremity weakness.  Hematological: Negative for  adenopathy.  Psychiatric/Behavioral:  Negative for confusion.    MEDICAL HISTORY:  Past Medical History:  Diagnosis Date  . Aneurysm of anterior cerebral artery   . Blindness of left eye    secondary to cataract surgery complication  . Depression   . Hypertension   . SAH (subarachnoid hemorrhage) ([place]) 02/2006  . Tobacco abuse     SURGICAL HISTORY: Past Surgical History:  Procedure Laterality Date  . ABDOMINAL HYSTERECTOMY    . ANEURYSM COILING    . CATARACT EXTRACTION    . CHOLECYSTECTOMY      SOCIAL HISTORY: Social History   Socioeconomic History  . Marital status: Widowed    Spouse name: Not on file  . Number of children: 1  . Years of education: Not on file  . Highest education level: Not on file  Occupational History  . Not on file  Tobacco Use  . Smoking status: Former Smoker    Quit date: 07/31/2015    Years since quitting: 3.7  . Smokeless tobacco: Never Used  Substance and Sexual Activity  . Alcohol use: Yes    Alcohol/week: 1.0 standard drinks    Types: 1 Glasses of wine per week    Comment: once a month  . Drug use: No  . Sexual activity: Not on file  Other Topics Concern  . Not on file  Social History Narrative  . Not on file   Social Determinants of Health   Financial Resource Strain:   . Difficulty of Paying Living Expenses:   Food Insecurity:   . Worried About Charity fundraiser in the Last Year:   . Arboriculturist in the Last Year:   Transportation Needs:   . Film/video editor (Medical):   Marland Kitchen Lack of Transportation (Non-Medical):   Physical Activity:   . Days of Exercise per Week:   . Minutes of Exercise per Session:   Stress:   . Feeling of Stress :   Social Connections:   . Frequency of Communication with Friends and Family:   . Frequency of Social Gatherings with Friends and Family:   . Attends Religious Services:   . Active Member of Clubs or Organizations:   . Attends Archivist Meetings:   Marland Kitchen Marital Status:     Intimate Partner Violence:   . Fear of Current or Ex-Partner:   . Emotionally Abused:   Marland Kitchen Physically Abused:   . Sexually Abused:     FAMILY HISTORY: Family History  Problem Relation Age of Onset  . Breast cancer Daughter 48  . Breast cancer Sister   . Throat cancer Mother   . Lung cancer Father   . Prostate cancer Brother   . Leukemia Brother   . Prostate cancer Brother   . Leukemia Brother     ALLERGIES:  has No Known Allergies.  MEDICATIONS:  Current Outpatient Medications  Medication Sig Dispense Refill  . amLODipine (NORVASC) 5 MG tablet Take 5 mg by mouth daily.    Marland Kitchen aspirin 81 MG tablet Take 81 mg by mouth daily.    . citalopram (CELEXA) 20 MG tablet Take 20 mg by mouth daily.    . diphenhydramine-acetaminophen (TYLENOL PM) 25-500 MG TABS tablet Take 1 tablet by mouth at bedtime as needed. Pt splits medication into 3    . ferrous sulfate 325 (65 FE) MG tablet Take 325 mg by mouth once a week. Take 1 tablet on Sundays.    Marland Kitchen JAKAFI 10 MG tablet Take 1 tablet (10 mg  total) by mouth 2 (two) times daily. 60 tablet 12  . lisinopril (PRINIVIL,ZESTRIL) 40 MG tablet Take 40 mg by mouth daily.    Marland Kitchen omeprazole (PRILOSEC) 20 MG capsule Take 20 mg by mouth every morning.    . Vitamin D, Ergocalciferol, (DRISDOL) 50000 units CAPS capsule Take 50,000 Units by mouth every Sunday.     No current facility-administered medications for this visit.     PHYSICAL EXAMINATION: ECOG PERFORMANCE STATUS: 1 - Symptomatic but completely ambulatory Vitals:   05/06/19 0958  BP: (!) 179/77  Pulse: 67  Resp: 16  Temp: (!) 95.1 F (35.1 C)   Filed Weights   05/06/19 0958  Weight: 95 lb 1.6 oz (43.1 kg)    Physical Exam Constitutional:      General: She is not in acute distress.    Comments: Thin, walk independently.  HENT:     Head: Normocephalic and atraumatic.  Eyes:     General: No scleral icterus.    Conjunctiva/sclera: Conjunctivae normal.     Pupils: Pupils are equal,  round, and reactive to light.  Cardiovascular:     Rate and Rhythm: Normal rate and regular rhythm.     Heart sounds: Normal heart sounds.  Pulmonary:     Effort: Pulmonary effort is normal. No respiratory distress.     Breath sounds: Normal breath sounds. No wheezing or rales.  Chest:     Chest wall: No tenderness.  Abdominal:     General: Bowel sounds are normal. There is no distension.     Palpations: Abdomen is soft. There is no mass.     Tenderness: There is no abdominal tenderness.     Comments: No splenomegaly.   Musculoskeletal:        General: No deformity. Normal range of motion.     Cervical back: Normal range of motion and neck supple.  Lymphadenopathy:     Cervical: No cervical adenopathy.  Skin:    General: Skin is warm and dry.     Findings: No erythema or rash.  Neurological:     Mental Status: She is alert and oriented to person, place, and time. Mental status is at baseline.     Cranial Nerves: No cranial nerve deficit.     Coordination: Coordination normal.  Psychiatric:        Mood and Affect: Mood normal.      LABORATORY DATA:  I have reviewed the data as listed Lab Results  Component Value Date   WBC 17.9 (H) 05/06/2019   HGB 11.7 (L) 05/06/2019   HCT 38.8 05/06/2019   MCV 99.7 05/06/2019   PLT 239 05/06/2019   Recent Labs    12/24/18 1135 02/05/19 1101 05/06/19 0928  NA 139 143 142  K 3.7 4.3 4.5  CL 106 106 108  CO2 _0 GLUCOSE 104* 93 102*  BUN _1 CREATININE 0.74 0.81 1.06*  CALCIUM 8.8* 9.4 9.0  GFRNONAA >60 >60 47*  GFRAA >60 >60 54*  PROT 6.8 7.6 7.2  ALBUMIN 4.1 4.5 3.8  AST 31 33 57*  ALT 17 26 34  ALKPHOS 55 58 128*  BILITOT 0.8 0.9 0.8    Peripheral blood flow cytometry showed neutrophilia with left shift, less than 1% myeloblasts and basophilia.  CBC and a phenotypic findings suggest a myeloproliferative disorder   # BCR ABL FISH negative. # Jak 2 V617 mutation positive  RADIOGRAPHIC STUDIES: I have  personally reviewed the radiological images as listed  and agreed with the findings in the report. No results found.   ASSESSMENT & PLAN:  1. JAK-2 gene mutation   2. Myelofibrosis ([place])   3. Weight loss    # JAK 2 V617 mutation positive, myelofibrosis Labs reviewed and discussed with patient. Clinically she has been doing well. Continue Jakafi 10 mg twice daily.  #Leukocytosis, trending up.  Repeat CBC in 4 weeks.  Continue to monitor. #Weight loss, patient has no other constitutional symptoms.  Appears to have had weight loss.  Attention on follow-up. Check TSH   We spent sufficient time to discuss many aspect of care, questions were answered to patient and daughters satisfaction. Repeat blood work in 6 months.  And follow-up in the office.  Orders Placed This Encounter  Procedures  . CBC with Differential/Platelet    Standing Status:   Future    Standing Expiration Date:   06/05/2020  . Comprehensive metabolic panel    Standing Status:   Future    Standing Expiration Date:   06/05/2020  . Lactate dehydrogenase    Standing Status:   Future    Standing Expiration Date:   06/05/2020  . Technologist smear review    Standing Status:   Future    Number of Occurrences:   1    Standing Expiration Date:   05/05/2020    Earlie Server, MD, PhD Hematology Oncology [place] [facility] 05/06/2019

## 2019-05-06 NOTE — Progress Notes (Signed)
Patient reports problems with a prolapsed bladder.

## 2019-06-04 ENCOUNTER — Other Ambulatory Visit: Payer: Self-pay

## 2019-06-04 ENCOUNTER — Inpatient Hospital Stay: Payer: Medicare Other | Attending: Oncology

## 2019-06-04 ENCOUNTER — Encounter: Payer: Self-pay | Admitting: Oncology

## 2019-06-04 ENCOUNTER — Inpatient Hospital Stay (HOSPITAL_BASED_OUTPATIENT_CLINIC_OR_DEPARTMENT_OTHER): Payer: Medicare Other | Admitting: Oncology

## 2019-06-04 VITALS — BP 156/78 | HR 74 | Temp 96.7°F | Resp 18 | Wt 97.1 lb

## 2019-06-04 DIAGNOSIS — D471 Chronic myeloproliferative disease: Secondary | ICD-10-CM | POA: Insufficient documentation

## 2019-06-04 DIAGNOSIS — Z90710 Acquired absence of both cervix and uterus: Secondary | ICD-10-CM | POA: Insufficient documentation

## 2019-06-04 DIAGNOSIS — Z801 Family history of malignant neoplasm of trachea, bronchus and lung: Secondary | ICD-10-CM | POA: Diagnosis not present

## 2019-06-04 DIAGNOSIS — R300 Dysuria: Secondary | ICD-10-CM

## 2019-06-04 DIAGNOSIS — Z806 Family history of leukemia: Secondary | ICD-10-CM | POA: Diagnosis not present

## 2019-06-04 DIAGNOSIS — D72829 Elevated white blood cell count, unspecified: Secondary | ICD-10-CM | POA: Insufficient documentation

## 2019-06-04 DIAGNOSIS — Z1589 Genetic susceptibility to other disease: Secondary | ICD-10-CM | POA: Diagnosis not present

## 2019-06-04 DIAGNOSIS — Z87891 Personal history of nicotine dependence: Secondary | ICD-10-CM | POA: Insufficient documentation

## 2019-06-04 DIAGNOSIS — N3000 Acute cystitis without hematuria: Secondary | ICD-10-CM

## 2019-06-04 DIAGNOSIS — R5383 Other fatigue: Secondary | ICD-10-CM | POA: Diagnosis not present

## 2019-06-04 DIAGNOSIS — D7581 Myelofibrosis: Secondary | ICD-10-CM

## 2019-06-04 DIAGNOSIS — R7989 Other specified abnormal findings of blood chemistry: Secondary | ICD-10-CM | POA: Diagnosis not present

## 2019-06-04 DIAGNOSIS — R634 Abnormal weight loss: Secondary | ICD-10-CM | POA: Insufficient documentation

## 2019-06-04 DIAGNOSIS — Z8042 Family history of malignant neoplasm of prostate: Secondary | ICD-10-CM | POA: Diagnosis not present

## 2019-06-04 DIAGNOSIS — Z803 Family history of malignant neoplasm of breast: Secondary | ICD-10-CM | POA: Diagnosis not present

## 2019-06-04 LAB — COMPREHENSIVE METABOLIC PANEL
ALT: 26 U/L (ref 0–44)
AST: 36 U/L (ref 15–41)
Albumin: 4.2 g/dL (ref 3.5–5.0)
Alkaline Phosphatase: 95 U/L (ref 38–126)
Anion gap: 11 (ref 5–15)
BUN: 17 mg/dL (ref 8–23)
CO2: 27 mmol/L (ref 22–32)
Calcium: 9.1 mg/dL (ref 8.9–10.3)
Chloride: 105 mmol/L (ref 98–111)
Creatinine, Ser: 1.02 mg/dL — ABNORMAL HIGH (ref 0.44–1.00)
GFR calc Af Amer: 57 mL/min — ABNORMAL LOW (ref >60)
GFR calc non Af Amer: 49 mL/min — ABNORMAL LOW (ref >60)
Glucose, Bld: 87 mg/dL (ref 70–99)
Potassium: 4.5 mmol/L (ref 3.5–5.1)
Sodium: 143 mmol/L (ref 135–145)
Total Bilirubin: 0.8 mg/dL (ref 0.3–1.2)
Total Protein: 7.4 g/dL (ref 6.5–8.1)

## 2019-06-04 LAB — URINALYSIS, COMPLETE (UACMP) WITH MICROSCOPIC
Bilirubin Urine: NEGATIVE
Glucose, UA: NEGATIVE mg/dL
Ketones, ur: NEGATIVE mg/dL
Nitrite: POSITIVE — AB
Protein, ur: 100 mg/dL — AB
Specific Gravity, Urine: 1.013 (ref 1.005–1.030)
WBC, UA: >50 WBC/hpf — ABNORMAL HIGH (ref 0–5)
pH: 6 (ref 5.0–8.0)

## 2019-06-04 LAB — CBC WITH DIFFERENTIAL/PLATELET
Abs Immature Granulocytes: 5.4 10*3/uL — ABNORMAL HIGH (ref 0.00–0.07)
Band Neutrophils: 11 %
Basophils Absolute: 0.8 10*3/uL — ABNORMAL HIGH (ref 0.0–0.1)
Basophils Relative: 3 %
Blasts: 3 %
Eosinophils Absolute: 1.1 10*3/uL — ABNORMAL HIGH (ref 0.0–0.5)
Eosinophils Relative: 4 %
HCT: 38.1 % (ref 36.0–46.0)
Hemoglobin: 11.4 g/dL — ABNORMAL LOW (ref 12.0–15.0)
Lymphocytes Relative: 12 %
Lymphs Abs: 3.2 10*3/uL (ref 0.7–4.0)
MCH: 29.7 pg (ref 26.0–34.0)
MCHC: 29.9 g/dL — ABNORMAL LOW (ref 30.0–36.0)
MCV: 99.2 fL (ref 80.0–100.0)
Metamyelocytes Relative: 8 %
Monocytes Absolute: 1.1 10*3/uL — ABNORMAL HIGH (ref 0.1–1.0)
Monocytes Relative: 4 %
Myelocytes: 7 %
Neutro Abs: 14.5 10*3/uL — ABNORMAL HIGH (ref 1.7–7.7)
Neutrophils Relative %: 43 %
Platelets: 317 10*3/uL (ref 150–400)
Promyelocytes Relative: 5 %
RBC: 3.84 MIL/uL — ABNORMAL LOW (ref 3.87–5.11)
RDW: 20.4 % — ABNORMAL HIGH (ref 11.5–15.5)
Smear Review: ADEQUATE
WBC: 26.8 10*3/uL — ABNORMAL HIGH (ref 4.0–10.5)
nRBC: 6.7 % — ABNORMAL HIGH (ref 0.0–0.2)

## 2019-06-04 LAB — TSH: TSH: 1.434 u[IU]/mL (ref 0.350–4.500)

## 2019-06-04 LAB — LACTATE DEHYDROGENASE: LDH: 948 U/L — ABNORMAL HIGH (ref 98–192)

## 2019-06-04 MED ORDER — CEFUROXIME AXETIL 250 MG PO TABS: 250.0000 mg | ORAL_TABLET | Freq: Two times a day (BID) | ORAL | 0 refills | Status: DC

## 2019-06-04 NOTE — Progress Notes (Signed)
Patient here for follow up. No concerns voiced.  °

## 2019-06-04 NOTE — Progress Notes (Signed)
Hematology/Oncology Follow up note [facility] Telephone:([phone] Fax:(336) [phone]   Patient Care Team: Tracie Harrier, MD as PCP - General (Internal Medicine)  REFERRING PROVIDER: Tracie Harrier, MD  REASON FOR VISIT Follow up for treatment of myelofibrosis  HISTORY OF PRESENTING ILLNESS:  [name] is a  86 y.o.  female with PMH listed below who was referred to me for evaluation of leukocytosis and splenomegaly. Patient was recently admitted to outside facility [facility] in [facility] on Jun 12, 2017 after she developed rectal bleeding.  CT was done during that visit which showed a splenomegaly, spleen enlarged at 14 cm.  There is an indeterminant 1.5 cm right adrenal mass.  Rectal bleeding was considered to be secondary to diverticulitis. Patient follows up with Dr. Ginette Pitman primary care physician. On being 29 2019.  CBC showed white count 14.3, hemoglobin 9.9, MCV 99.4, immature granulocytes 1.2, reticulocyte percentage 7.78 elevated, platelet count 386,000, differential showed neutrophilia, ANC 9.93, basophils 0.47,  Reviewed patient's previous lab work-up.  She has had leukocytosis since October 2018 with similar differential abnormalities. Patient has also reported easy bruising, no episodes of acute bleeding.  She also had weight loss about 12 pounds for the past 2 to 3 months.  Reports feeling fatigued.   11/06/2017 Bone marrow biopsy aspirate showed hypercellular bone marrow with features of myeloproliferative neoplasm.  Discussed with Dr. Gari Crown All aspirate material is suboptimal but "biopsy touch imprint in the peripheral blood mostly show features of myeloproliferative neoplasm.  Particular the primary myelofibrosis especially in conjunction with patient's previously documented Jak 2 mutation.  No apparent increasing blast cells is seen.  An iron stain performed on touch imprints showed scattered offering centroblast.   Letter finding is of uncertain significance in this setting and may represent a dysplastic component.  Cytogenetics shows normal karyotype.  Foundation heme pending high molecular risk mutation status[ ASXL1, SRSF2, A6007029, EZH2, IDH1/2] . GIPSS prognostic model: 1 point or more depending on foundation HEME results.  MIPSS70+ v2.0: She has constitutional symptoms which is 2 points, normal karyotype.  Status of high molecular risk mutational unknown Likely intermediate risk or higher.  # 12/06/2017 started on Jakafi 70m BID.   #Adrenal mass, no need for additional work-up.   12/04/2018 CT abdomen adrenal protocol showed benign bilateral adrenal adenoma.  Mild to moderate splenomegaly.  Chronic hiatal hernia and chronic compression fracture of L1. Coronary atherosclerosis.  Aortic atherosclerosis. Images were independently viewed by me and discussed with patient.   INTERVAL HISTORY Morgan CALOCAis a 86y.o. female who has above history reviewed by me today presents for follow up for primary myelofibrosis  Patient was accompanied by her daughter. Patient reports that she has good appetite.  Has gained 2 pounds since last visit. Reports dysuria and increased frequency.  No fever, chills, abdominal pain.  History of bladder prolapse.  Review of Systems  Constitutional: Positive for fatigue. Negative for appetite change, chills and fever.  HENT:   Negative for hearing loss and voice change.   Eyes: Negative for eye problems.  Respiratory: Negative for chest tightness and cough.   Cardiovascular: Negative for chest pain.  Gastrointestinal: Negative for abdominal distention, abdominal pain and blood in stool.  Endocrine: Negative for hot flashes.  Genitourinary: Positive for dysuria and frequency. Negative for difficulty urinating.   Musculoskeletal: Negative for arthralgias.  Skin: Negative for itching and rash.  Neurological: Negative for extremity weakness.  Hematological:  Negative for adenopathy.  Psychiatric/Behavioral:  Negative for confusion.    MEDICAL HISTORY:  Past Medical History:  Diagnosis Date   Aneurysm of anterior cerebral artery    Blindness of left eye    secondary to cataract surgery complication   Depression    Hypertension    SAH (subarachnoid hemorrhage) ([place]) 02/2006   Tobacco abuse     SURGICAL HISTORY: Past Surgical History:  Procedure Laterality Date   ABDOMINAL HYSTERECTOMY     ANEURYSM COILING     CATARACT EXTRACTION     CHOLECYSTECTOMY      SOCIAL HISTORY: Social History   Socioeconomic History   Marital status: Widowed    Spouse name: Not on file   Number of children: 1   Years of education: Not on file   Highest education level: Not on file  Occupational History   Not on file  Tobacco Use   Smoking status: Former Smoker    Quit date: 07/31/2015    Years since quitting: 3.8   Smokeless tobacco: Never Used  Substance and Sexual Activity   Alcohol use: Yes    Alcohol/week: 1.0 standard drinks    Types: 1 Glasses of wine per week    Comment: once a month   Drug use: No   Sexual activity: Not on file  Other Topics Concern   Not on file  Social History Narrative   Not on file   Social Determinants of Health   Financial Resource Strain:    Difficulty of Paying Living Expenses:   Food Insecurity:    Worried About Charity fundraiser in the Last Year:    Arboriculturist in the Last Year:   Transportation Needs:    Film/video editor (Medical):    Lack of Transportation (Non-Medical):   Physical Activity:    Days of Exercise per Week:    Minutes of Exercise per Session:   Stress:    Feeling of Stress :   Social Connections:    Frequency of Communication with Friends and Family:    Frequency of Social Gatherings with Friends and Family:    Attends Religious Services:    Active Member of Clubs or Organizations:    Attends Music therapist:     Marital Status:   Intimate Partner Violence:    Fear of Current or Ex-Partner:    Emotionally Abused:    Physically Abused:    Sexually Abused:     FAMILY HISTORY: Family History  Problem Relation Age of Onset   Breast cancer Daughter 97   Breast cancer Sister    Throat cancer Mother    Lung cancer Father    Prostate cancer Brother    Leukemia Brother    Prostate cancer Brother    Leukemia Brother     ALLERGIES:  has No Known Allergies.  MEDICATIONS:  Current Outpatient Medications  Medication Sig Dispense Refill   amLODipine (NORVASC) 5 MG tablet Take 5 mg by mouth daily.     aspirin 81 MG tablet Take 81 mg by mouth daily.     citalopram (CELEXA) 20 MG tablet Take 20 mg by mouth daily.     diphenhydramine-acetaminophen (TYLENOL PM) 25-500 MG TABS tablet Take 1 tablet by mouth at bedtime as needed. Pt splits medication into 3     ferrous sulfate 325 (65 FE) MG tablet Take 325 mg by mouth once a week. Take 1 tablet on Sundays.     JAKAFI 10 MG tablet Take 1 tablet (10 mg total)  by mouth 2 (two) times daily. 60 tablet 12   lisinopril (PRINIVIL,ZESTRIL) 40 MG tablet Take 40 mg by mouth daily.     omeprazole (PRILOSEC) 20 MG capsule Take 20 mg by mouth every morning.     Vitamin D, Ergocalciferol, (DRISDOL) 50000 units CAPS capsule Take 50,000 Units by mouth every Sunday.     No current facility-administered medications for this visit.     PHYSICAL EXAMINATION: ECOG PERFORMANCE STATUS: 1 - Symptomatic but completely ambulatory Vitals:   06/04/19 1321  BP: (!) 156/78  Pulse: 74  Resp: 18  Temp: (!) 96.7 F (35.9 C)   Filed Weights   06/04/19 1321  Weight: 97 lb 1.6 oz (44 kg)    Physical Exam Constitutional:      General: She is not in acute distress.    Comments: Thin, walk independently.  HENT:     Head: Normocephalic and atraumatic.  Eyes:     General: No scleral icterus.    Conjunctiva/sclera: Conjunctivae normal.     Pupils: Pupils  are equal, round, and reactive to light.  Cardiovascular:     Rate and Rhythm: Normal rate and regular rhythm.     Heart sounds: Normal heart sounds.  Pulmonary:     Effort: Pulmonary effort is normal. No respiratory distress.     Breath sounds: Normal breath sounds. No wheezing or rales.  Chest:     Chest wall: No tenderness.  Abdominal:     General: Bowel sounds are normal. There is no distension.     Palpations: Abdomen is soft. There is no mass.     Tenderness: There is no abdominal tenderness.     Comments: No splenomegaly.   Musculoskeletal:        General: No deformity. Normal range of motion.     Cervical back: Normal range of motion and neck supple.  Lymphadenopathy:     Cervical: No cervical adenopathy.  Skin:    General: Skin is warm and dry.     Findings: No erythema or rash.  Neurological:     Mental Status: She is alert and oriented to person, place, and time. Mental status is at baseline.     Cranial Nerves: No cranial nerve deficit.     Coordination: Coordination normal.  Psychiatric:        Mood and Affect: Mood normal.      LABORATORY DATA:  I have reviewed the data as listed Lab Results  Component Value Date   WBC 26.8 (H) 06/04/2019   HGB 11.4 (L) 06/04/2019   HCT 38.1 06/04/2019   MCV 99.2 06/04/2019   PLT 317 06/04/2019   Recent Labs    02/05/19 1101 05/06/19 0928 06/04/19 1300  NA 143 142 143  K 4.3 4.5 4.5  CL 106 108 105  CO2 '29 27 27  ' GLUCOSE 93 102* 87  BUN '14 18 17  ' CREATININE 0.81 1.06* 1.02*  CALCIUM 9.4 9.0 9.1  GFRNONAA >60 47* 49*  GFRAA >60 54* 57*  PROT 7.6 7.2 7.4  ALBUMIN 4.5 3.8 4.2  AST 33 57* 36  ALT 26 34 26  ALKPHOS 58 128* 95  BILITOT 0.9 0.8 0.8    Peripheral blood flow cytometry showed neutrophilia with left shift, less than 1% myeloblasts and basophilia.  CBC and a phenotypic findings suggest a myeloproliferative disorder   # BCR ABL FISH negative. # Jak 2 V617 mutation positive  RADIOGRAPHIC  STUDIES: I have personally reviewed the radiological images as listed and  agreed with the findings in the report. No results found.   ASSESSMENT & PLAN:  1. Acute cystitis without hematuria   2. JAK-2 gene mutation   3. Myelofibrosis ([place])    # JAK 2 V617 mutation positive, myelofibrosis Labs reviewed and discussed with patient and daughter. Leukocytosis has worsened since last visit.  Total white count increased to 26.8 Continue Jakafi 10 mg twice daily.  #Leukocytosis, trending up.  Reactive versus due to progression of myelofibrosis.  She otherwise does not have constitutional symptoms.  #Dysuria, I will check UA and urine culture. -UA was available after patient's clinic.  Nitrate is positive, large amount of leukocytes consistent with UTI. I will start patient empirically on Ceftin 250 mg twice daily for 5 days. -Elevated creatinine, encourage patient to increase oral hydration.   We spent sufficient time to discuss many aspect of care, questions were answered to patient and daughters satisfaction. Follow-up in 4 weeks..   Orders Placed This Encounter  Procedures   Urine culture    Standing Status:   Future    Number of Occurrences:   1    Standing Expiration Date:   06/03/2020   Urinalysis, Complete w Microscopic   CBC with Differential/Platelet    Standing Status:   Future    Standing Expiration Date:   06/03/2020   Comprehensive metabolic panel    Standing Status:   Future    Standing Expiration Date:   06/03/2020    Earlie Server, MD, PhD Hematology Oncology [place] at [facility] 06/04/2019

## 2019-06-05 ENCOUNTER — Telehealth: Payer: Self-pay

## 2019-06-05 NOTE — Telephone Encounter (Signed)
-----   Message from Earlie Server, MD sent at 06/04/2019 10:31 PM EDT ----- UA is consistent with UTI. Culture is pending. Recommend empiric antibiotics Ceftin, Rx was sent to pharmacy. Please let her know. Thank you.

## 2019-06-05 NOTE — Telephone Encounter (Signed)
Notified patient's daughter, [name].

## 2019-06-06 LAB — URINE CULTURE: Culture: >=100000 — AB

## 2019-07-01 ENCOUNTER — Encounter: Payer: Self-pay | Admitting: Oncology

## 2019-07-01 NOTE — Progress Notes (Signed)
Patient has a bruise with a knot on her left leg after a fall last week and reports that her leg/she is fine.  Dr. Ginette Pitman checked her urine again which showed bacteria so they are calling her in another antibiotic today.

## 2019-07-02 ENCOUNTER — Inpatient Hospital Stay (HOSPITAL_BASED_OUTPATIENT_CLINIC_OR_DEPARTMENT_OTHER): Payer: Medicare Other | Admitting: Oncology

## 2019-07-02 ENCOUNTER — Other Ambulatory Visit: Payer: Self-pay

## 2019-07-02 ENCOUNTER — Ambulatory Visit
Admission: RE | Admit: 2019-07-02 | Discharge: 2019-07-02 | Disposition: A | Discharge status: Home / Self Care | Payer: Medicare Other | Source: Ambulatory Visit | Attending: Oncology | Admitting: Oncology

## 2019-07-02 ENCOUNTER — Inpatient Hospital Stay: Payer: Medicare Other | Attending: Oncology

## 2019-07-02 VITALS — BP 173/67 | HR 67 | Temp 95.6°F | Wt 98.6 lb

## 2019-07-02 DIAGNOSIS — Z90710 Acquired absence of both cervix and uterus: Secondary | ICD-10-CM | POA: Diagnosis not present

## 2019-07-02 DIAGNOSIS — Z808 Family history of malignant neoplasm of other organs or systems: Secondary | ICD-10-CM | POA: Diagnosis not present

## 2019-07-02 DIAGNOSIS — M7989 Other specified soft tissue disorders: Secondary | ICD-10-CM | POA: Insufficient documentation

## 2019-07-02 DIAGNOSIS — R350 Frequency of micturition: Secondary | ICD-10-CM | POA: Insufficient documentation

## 2019-07-02 DIAGNOSIS — Z803 Family history of malignant neoplasm of breast: Secondary | ICD-10-CM | POA: Diagnosis not present

## 2019-07-02 DIAGNOSIS — Z87891 Personal history of nicotine dependence: Secondary | ICD-10-CM | POA: Diagnosis not present

## 2019-07-02 DIAGNOSIS — Z1589 Genetic susceptibility to other disease: Secondary | ICD-10-CM

## 2019-07-02 DIAGNOSIS — D7581 Myelofibrosis: Secondary | ICD-10-CM | POA: Insufficient documentation

## 2019-07-02 DIAGNOSIS — Z801 Family history of malignant neoplasm of trachea, bronchus and lung: Secondary | ICD-10-CM | POA: Diagnosis not present

## 2019-07-02 DIAGNOSIS — W19XXXA Unspecified fall, initial encounter: Secondary | ICD-10-CM | POA: Insufficient documentation

## 2019-07-02 DIAGNOSIS — Z806 Family history of leukemia: Secondary | ICD-10-CM | POA: Diagnosis not present

## 2019-07-02 DIAGNOSIS — R300 Dysuria: Secondary | ICD-10-CM | POA: Diagnosis not present

## 2019-07-02 LAB — CBC WITH DIFFERENTIAL/PLATELET
Abs Immature Granulocytes: 3.94 10*3/uL — ABNORMAL HIGH (ref 0.00–0.07)
Basophils Absolute: 1 10*3/uL — ABNORMAL HIGH (ref 0.0–0.1)
Basophils Relative: 6 %
Eosinophils Absolute: 0.1 10*3/uL (ref 0.0–0.5)
Eosinophils Relative: 0 %
HCT: 36.5 % (ref 36.0–46.0)
Hemoglobin: 11.1 g/dL — ABNORMAL LOW (ref 12.0–15.0)
Immature Granulocytes: 22 %
Lymphocytes Relative: 12 %
Lymphs Abs: 2.2 10*3/uL (ref 0.7–4.0)
MCH: 30.2 pg (ref 26.0–34.0)
MCHC: 30.4 g/dL (ref 30.0–36.0)
MCV: 99.5 fL (ref 80.0–100.0)
Monocytes Absolute: 1.2 10*3/uL — ABNORMAL HIGH (ref 0.1–1.0)
Monocytes Relative: 7 %
Neutro Abs: 9.8 10*3/uL — ABNORMAL HIGH (ref 1.7–7.7)
Neutrophils Relative %: 53 %
Platelets: 290 10*3/uL (ref 150–400)
RBC: 3.67 MIL/uL — ABNORMAL LOW (ref 3.87–5.11)
RDW: 20.6 % — ABNORMAL HIGH (ref 11.5–15.5)
Smear Review: ADEQUATE
WBC: 18.3 10*3/uL — ABNORMAL HIGH (ref 4.0–10.5)
nRBC: 11.5 % — ABNORMAL HIGH (ref 0.0–0.2)

## 2019-07-02 LAB — COMPREHENSIVE METABOLIC PANEL
ALT: 17 U/L (ref 0–44)
AST: 29 U/L (ref 15–41)
Albumin: 4.1 g/dL (ref 3.5–5.0)
Alkaline Phosphatase: 60 U/L (ref 38–126)
Anion gap: 8 (ref 5–15)
BUN: 19 mg/dL (ref 8–23)
CO2: 29 mmol/L (ref 22–32)
Calcium: 8.8 mg/dL — ABNORMAL LOW (ref 8.9–10.3)
Chloride: 107 mmol/L (ref 98–111)
Creatinine, Ser: 0.89 mg/dL (ref 0.44–1.00)
GFR calc Af Amer: >60 mL/min (ref >60)
GFR calc non Af Amer: 58 mL/min — ABNORMAL LOW (ref >60)
Glucose, Bld: 154 mg/dL — ABNORMAL HIGH (ref 70–99)
Potassium: 3.4 mmol/L — ABNORMAL LOW (ref 3.5–5.1)
Sodium: 144 mmol/L (ref 135–145)
Total Bilirubin: 0.9 mg/dL (ref 0.3–1.2)
Total Protein: 7.1 g/dL (ref 6.5–8.1)

## 2019-07-02 NOTE — Progress Notes (Signed)
Hematology/Oncology Follow up note [facility] [phone] [phone]   Patient Care Team: Tracie Harrier, MD as PCP - General (Internal Medicine)  REFERRING PROVIDER: Tracie Harrier, MD  REASON FOR VISIT Follow up for treatment of myelofibrosis  HISTORY OF PRESENTING ILLNESS:  [name] is a  86 y.o.  female with PMH listed below who was referred to me for evaluation of leukocytosis and splenomegaly. Patient was recently admitted to outside facility [facility] in [facility] on Jun 12, 2017 after she developed rectal bleeding.  CT was done during that visit which showed a splenomegaly, spleen enlarged at 14 cm.  There is an indeterminant 1.5 cm right adrenal mass.  Rectal bleeding was considered to be secondary to diverticulitis. Patient follows up with Dr. Ginette Pitman primary care physician. On being 29 2019.  CBC showed white count 14.3, hemoglobin 9.9, MCV 99.4, immature granulocytes 1.2, reticulocyte percentage 7.78 elevated, platelet count 386,000, differential showed neutrophilia, ANC 9.93, basophils 0.47,  Reviewed patient's previous lab work-up.  She has had leukocytosis since October 2018 with similar differential abnormalities. Patient has also reported easy bruising, no episodes of acute bleeding.  She also had weight loss about 12 pounds for the past 2 to 3 months.  Reports feeling fatigued.   11/06/2017 Bone marrow biopsy aspirate showed hypercellular bone marrow with features of myeloproliferative neoplasm.  Discussed with Dr. Gari Crown All aspirate material is suboptimal but "biopsy touch imprint in the peripheral blood mostly show features of myeloproliferative neoplasm.  Particular the primary myelofibrosis especially in conjunction with patient's previously documented Jak 2 mutation.  No apparent increasing blast cells is seen.  An iron stain performed on touch imprints showed scattered offering centroblast.   Letter finding is of uncertain significance in this setting and may represent a dysplastic component.  Cytogenetics shows normal karyotype.  Foundation heme pending high molecular risk mutation status[ ASXL1, SRSF2, [id number], EZH2, IDH1/2] . GIPSS prognostic model: 1 point or more depending on foundation HEME results.  MIPSS70+ v2.0: She has constitutional symptoms which is 2 points, normal karyotype.  Status of high molecular risk mutational unknown Likely intermediate risk or higher.  # 12/06/2017 started on Jakafi 60m BID.   #Adrenal mass, no need for additional work-up.   12/04/2018 CT abdomen adrenal protocol showed benign bilateral adrenal adenoma.  Mild to moderate splenomegaly.  Chronic hiatal hernia and chronic compression fracture of L1. Coronary atherosclerosis.  Aortic atherosclerosis. Images were independently viewed by me and discussed with patient.   INTERVAL HISTORY Morgan WORTHLEYis a 86y.o. female who has above history reviewed by me today presents for follow up for primary myelofibrosis  Patient was accompanied by her sister. Patient reports good appetite.  Her weight has been stable She recently had a fall and noticed bruising on her lower extremities and a knot/swelling of left lower extremity.  Patient reports that she has good appetite.  Has gained 2 pounds since last visit. Reports dysuria and increased frequency.  No fever, chills, abdominal pain.  History of bladder prolapse. She denies any dysuria symptoms.  Review of Systems  Constitutional: Negative for appetite change, chills, fatigue and fever.  HENT:   Negative for hearing loss and voice change.   Eyes: Negative for eye problems.  Respiratory: Negative for chest tightness and cough.   Cardiovascular: Positive for leg swelling. Negative for chest pain.  Gastrointestinal: Negative for abdominal distention, abdominal pain and blood in stool.  Endocrine: Negative for hot flashes.  Genitourinary:  Negative for difficulty urinating, dysuria and frequency.   Musculoskeletal: Negative for arthralgias.  Skin: Negative for itching and rash.  Neurological: Negative for extremity weakness.  Hematological: Negative for adenopathy.  Psychiatric/Behavioral: Negative for confusion.    MEDICAL HISTORY:  Past Medical History:  Diagnosis Date  . Aneurysm of anterior cerebral artery   . Blindness of left eye    secondary to cataract surgery complication  . Depression   . Hypertension   . SAH (subarachnoid hemorrhage) ([place]) 02/2006  . Tobacco abuse     SURGICAL HISTORY: Past Surgical History:  Procedure Laterality Date  . ABDOMINAL HYSTERECTOMY    . ANEURYSM COILING    . CATARACT EXTRACTION    . CHOLECYSTECTOMY      SOCIAL HISTORY: Social History   Socioeconomic History  . Marital status: Widowed    Spouse name: Not on file  . Number of children: 1  . Years of education: Not on file  . Highest education level: Not on file  Occupational History  . Not on file  Tobacco Use  . Smoking status: Former Smoker    Quit date: 07/31/2015    Years since quitting: 3.9  . Smokeless tobacco: Never Used  Substance and Sexual Activity  . Alcohol use: Yes    Alcohol/week: 1.0 standard drinks    Types: 1 Glasses of wine per week    Comment: once a month  . Drug use: No  . Sexual activity: Not on file  Other Topics Concern  . Not on file  Social History Narrative  . Not on file   Social Determinants of Health   Financial Resource Strain:   . Difficulty of Paying Living Expenses:   Food Insecurity:   . Worried About Charity fundraiser in the Last Year:   . Arboriculturist in the Last Year:   Transportation Needs:   . Film/video editor (Medical):   Marland Kitchen Lack of Transportation (Non-Medical):   Physical Activity:   . Days of Exercise per Week:   . Minutes of Exercise per Session:   Stress:   . Feeling of Stress :   Social Connections:   . Frequency of Communication with  Friends and Family:   . Frequency of Social Gatherings with Friends and Family:   . Attends Religious Services:   . Active Member of Clubs or Organizations:   . Attends Archivist Meetings:   Marland Kitchen Marital Status:   Intimate Partner Violence:   . Fear of Current or Ex-Partner:   . Emotionally Abused:   Marland Kitchen Physically Abused:   . Sexually Abused:     FAMILY HISTORY: Family History  Problem Relation Age of Onset  . Breast cancer Daughter 90  . Breast cancer Sister   . Throat cancer Mother   . Lung cancer Father   . Prostate cancer Brother   . Leukemia Brother   . Prostate cancer Brother   . Leukemia Brother     ALLERGIES:  has No Known Allergies.  MEDICATIONS:  Current Outpatient Medications  Medication Sig Dispense Refill  . amLODipine (NORVASC) 5 MG tablet Take 5 mg by mouth daily.    Marland Kitchen aspirin 81 MG tablet Take 81 mg by mouth daily.    . citalopram (CELEXA) 20 MG tablet Take 20 mg by mouth daily.    . diphenhydramine-acetaminophen (TYLENOL PM) 25-500 MG TABS tablet Take 1 tablet by mouth at bedtime as needed. Pt splits medication into 3    .  ferrous sulfate 325 (65 FE) MG tablet Take 325 mg by mouth once a week. Take 1 tablet on Sundays.    Marland Kitchen JAKAFI 10 MG tablet Take 1 tablet (10 mg total) by mouth 2 (two) times daily. 60 tablet 12  . lisinopril (PRINIVIL,ZESTRIL) 40 MG tablet Take 40 mg by mouth daily.    Marland Kitchen omeprazole (PRILOSEC) 20 MG capsule Take 20 mg by mouth every morning.    . Vitamin D, Ergocalciferol, (DRISDOL) 50000 units CAPS capsule Take 50,000 Units by mouth every Sunday.    . cefUROXime (CEFTIN) 250 MG tablet Take 1 tablet (250 mg total) by mouth 2 (two) times daily with a meal. (Patient not taking: Reported on 07/01/2019) 10 tablet 0   No current facility-administered medications for this visit.     PHYSICAL EXAMINATION: ECOG PERFORMANCE STATUS: 1 - Symptomatic but completely ambulatory Vitals:   07/01/19 1651  BP: (!) 173/67  Pulse: 67  Temp: (!)  95.6 F (35.3 C)  SpO2: 97%   Filed Weights   07/01/19 1651  Weight: 98 lb 9.6 oz (44.7 kg)    Physical Exam Constitutional:      General: She is not in acute distress.    Comments: Thin, walk independently.  HENT:     Head: Normocephalic and atraumatic.  Eyes:     General: No scleral icterus.    Conjunctiva/sclera: Conjunctivae normal.     Pupils: Pupils are equal, round, and reactive to light.  Cardiovascular:     Rate and Rhythm: Normal rate and regular rhythm.     Heart sounds: Normal heart sounds.  Pulmonary:     Effort: Pulmonary effort is normal. No respiratory distress.     Breath sounds: Normal breath sounds. No wheezing or rales.  Chest:     Chest wall: No tenderness.  Abdominal:     General: Bowel sounds are normal. There is no distension.     Palpations: Abdomen is soft. There is no mass.     Tenderness: There is no abdominal tenderness.     Comments: No splenomegaly.   Musculoskeletal:        General: No deformity. Normal range of motion.     Cervical back: Normal range of motion and neck supple.     Comments: Bilateral lower extremity bruising.  Left lower extremity focal swelling  Lymphadenopathy:     Cervical: No cervical adenopathy.  Skin:    General: Skin is warm and dry.     Findings: No erythema or rash.  Neurological:     Mental Status: She is alert and oriented to person, place, and time. Mental status is at baseline.     Cranial Nerves: No cranial nerve deficit.     Coordination: Coordination normal.  Psychiatric:        Mood and Affect: Mood normal.        LABORATORY DATA:  I have reviewed the data as listed Lab Results  Component Value Date   WBC 18.3 (H) 07/02/2019   HGB 11.1 (L) 07/02/2019   HCT 36.5 07/02/2019   MCV 99.5 07/02/2019   PLT 290 07/02/2019   Recent Labs    05/06/19 0928 06/04/19 1300 07/02/19 1252  NA 142 143 144  K 4.5 4.5 3.4*  CL 108 105 107  CO2 _0 GLUCOSE 102* 87 154*  BUN _1 CREATININE 1.06* 1.02* 0.89  CALCIUM 9.0 9.1 8.8*  GFRNONAA 47* 49* 58*  GFRAA 54* 57* >60  PROT 7.2 7.4 7.1  ALBUMIN 3.8 4.2 4.1  AST 57* 36 29  ALT 34 26 17  ALKPHOS 128* 95 60  BILITOT 0.8 0.8 0.9    Peripheral blood flow cytometry showed neutrophilia with left shift, less than 1% myeloblasts and basophilia.  CBC and a phenotypic findings suggest a myeloproliferative disorder   # BCR ABL FISH negative. # Jak 2 V617 mutation positive  RADIOGRAPHIC STUDIES: I have personally reviewed the radiological images as listed and agreed with the findings in the report. No results found.   ASSESSMENT & PLAN:  1. Swelling of limb   2. Myelofibrosis ([place])    # JAK 2 V617 mutation positive, myelofibrosis Patient is clinically doing very well. Labs reviewed and discussed with patient. Leukocytosis has improved and returned back to her baseline. Previous worsen of leukocytosis likely due to UTI which has been treated. Currently she is asymptomatic  #Status post fall with left lower extremity swelling.  I will obtain ultrasound for further evaluation of hematoma/DVT  We spent sufficient time to discuss many aspect of care, questions were answered to patient and daughters satisfaction. Follow-up in 2 months.   Orders Placed This Encounter  Procedures  . US Venous Img Lower Unilateral Left    Standing Status:   Future    Number of Occurrences:   1    Standing Expiration Date:   07/01/2020    Order Specific Question:   Reason for Exam (SYMPTOM  OR DIAGNOSIS REQUIRED)    Answer:   left lower extremity swelling, rule out dvt    Order Specific Question:   Preferred imaging location?    Answer:   [facility]    Order Specific Question:   Call Results- Best Contact Number?    Answer:   [phone].Marland KitchenMarland Kitchenpatient can leave  . CBC with Differential/Platelet    Standing Status:   Future    Standing Expiration Date:   07/01/2020  . Comprehensive metabolic panel    Standing Status:    Future    Standing Expiration Date:   07/01/2020    Earlie Server, MD, PhD Hematology Oncology [place] at [facility] 07/02/2019

## 2019-07-03 ENCOUNTER — Telehealth: Payer: Self-pay

## 2019-07-03 ENCOUNTER — Ambulatory Visit: Payer: Medicare Other

## 2019-07-03 NOTE — Telephone Encounter (Signed)
-----   Message from Earlie Server, MD sent at 07/02/2019  5:14 PM EDT ----- Please let patient/daughter know that there is no DVT.  Palpable area of concern appears to be a fluid collection, given her recent trauma history, possible hematoma.  Recommend continue observation if it is getting bigger, ask her to update [place].  Thank you

## 2019-07-03 NOTE — Telephone Encounter (Signed)
Patient notified

## 2019-09-04 ENCOUNTER — Inpatient Hospital Stay: Payer: Medicare Other

## 2019-09-04 ENCOUNTER — Inpatient Hospital Stay: Payer: Medicare Other | Admitting: Oncology

## 2019-09-12 ENCOUNTER — Other Ambulatory Visit: Payer: Self-pay

## 2019-09-12 ENCOUNTER — Inpatient Hospital Stay: Payer: Medicare Other | Attending: Oncology

## 2019-09-12 ENCOUNTER — Encounter: Payer: Self-pay | Admitting: Oncology

## 2019-09-12 ENCOUNTER — Inpatient Hospital Stay (HOSPITAL_BASED_OUTPATIENT_CLINIC_OR_DEPARTMENT_OTHER): Payer: Medicare Other | Admitting: Oncology

## 2019-09-12 VITALS — BP 103/86 | HR 64 | Temp 96.5°F | Resp 16 | Wt 95.0 lb

## 2019-09-12 DIAGNOSIS — Z808 Family history of malignant neoplasm of other organs or systems: Secondary | ICD-10-CM | POA: Diagnosis not present

## 2019-09-12 DIAGNOSIS — D7581 Myelofibrosis: Secondary | ICD-10-CM

## 2019-09-12 DIAGNOSIS — Z806 Family history of leukemia: Secondary | ICD-10-CM | POA: Diagnosis not present

## 2019-09-12 DIAGNOSIS — Z803 Family history of malignant neoplasm of breast: Secondary | ICD-10-CM | POA: Insufficient documentation

## 2019-09-12 DIAGNOSIS — Z87891 Personal history of nicotine dependence: Secondary | ICD-10-CM | POA: Diagnosis not present

## 2019-09-12 DIAGNOSIS — H5440 Blindness, one eye, unspecified eye: Secondary | ICD-10-CM | POA: Diagnosis not present

## 2019-09-12 DIAGNOSIS — R634 Abnormal weight loss: Secondary | ICD-10-CM | POA: Diagnosis not present

## 2019-09-12 DIAGNOSIS — R161 Splenomegaly, not elsewhere classified: Secondary | ICD-10-CM | POA: Insufficient documentation

## 2019-09-12 DIAGNOSIS — Z801 Family history of malignant neoplasm of trachea, bronchus and lung: Secondary | ICD-10-CM | POA: Diagnosis not present

## 2019-09-12 DIAGNOSIS — Z1589 Genetic susceptibility to other disease: Secondary | ICD-10-CM

## 2019-09-12 DIAGNOSIS — Z90710 Acquired absence of both cervix and uterus: Secondary | ICD-10-CM | POA: Insufficient documentation

## 2019-09-12 LAB — CBC WITH DIFFERENTIAL/PLATELET
Abs Immature Granulocytes: 3.1 10*3/uL — ABNORMAL HIGH (ref 0.00–0.07)
Band Neutrophils: 11 %
Basophils Absolute: 0.4 10*3/uL — ABNORMAL HIGH (ref 0.0–0.1)
Basophils Relative: 3 %
Eosinophils Absolute: 0.4 10*3/uL (ref 0.0–0.5)
Eosinophils Relative: 3 %
HCT: 35.9 % — ABNORMAL LOW (ref 36.0–46.0)
Hemoglobin: 11.1 g/dL — ABNORMAL LOW (ref 12.0–15.0)
Lymphocytes Relative: 18 %
Lymphs Abs: 2.6 10*3/uL (ref 0.7–4.0)
MCH: 29.9 pg (ref 26.0–34.0)
MCHC: 30.9 g/dL (ref 30.0–36.0)
MCV: 96.8 fL (ref 80.0–100.0)
Metamyelocytes Relative: 12 %
Monocytes Absolute: 0.6 10*3/uL (ref 0.1–1.0)
Monocytes Relative: 4 %
Myelocytes: 9 %
Neutro Abs: 7.5 10*3/uL (ref 1.7–7.7)
Neutrophils Relative %: 40 %
Platelets: 249 10*3/uL (ref 150–400)
RBC: 3.71 MIL/uL — ABNORMAL LOW (ref 3.87–5.11)
RDW: 20.9 % — ABNORMAL HIGH (ref 11.5–15.5)
Smear Review: ADEQUATE
WBC: 14.7 10*3/uL — ABNORMAL HIGH (ref 4.0–10.5)
nRBC: 8.4 % — ABNORMAL HIGH (ref 0.0–0.2)

## 2019-09-12 LAB — COMPREHENSIVE METABOLIC PANEL
ALT: 16 U/L (ref 0–44)
AST: 28 U/L (ref 15–41)
Albumin: 4.2 g/dL (ref 3.5–5.0)
Alkaline Phosphatase: 54 U/L (ref 38–126)
Anion gap: 8 (ref 5–15)
BUN: 15 mg/dL (ref 8–23)
CO2: 30 mmol/L (ref 22–32)
Calcium: 8.7 mg/dL — ABNORMAL LOW (ref 8.9–10.3)
Chloride: 104 mmol/L (ref 98–111)
Creatinine, Ser: 0.7 mg/dL (ref 0.44–1.00)
GFR calc Af Amer: >60 mL/min (ref >60)
GFR calc non Af Amer: >60 mL/min (ref >60)
Glucose, Bld: 89 mg/dL (ref 70–99)
Potassium: 3.9 mmol/L (ref 3.5–5.1)
Sodium: 142 mmol/L (ref 135–145)
Total Bilirubin: 0.9 mg/dL (ref 0.3–1.2)
Total Protein: 6.9 g/dL (ref 6.5–8.1)

## 2019-09-12 NOTE — Progress Notes (Signed)
Hematology/Oncology Follow up note [facility] Telephone:([phone] [phone]   Patient Care Team: Tracie Harrier, MD as PCP - General (Internal Medicine)  REFERRING PROVIDER: Tracie Harrier, MD  REASON FOR VISIT Follow up for treatment of myelofibrosis  HISTORY OF PRESENTING ILLNESS:  [name] is a  86 y.o.  female with PMH listed below who was referred to me for evaluation of leukocytosis and splenomegaly. Patient was recently admitted to outside facility [facility] in [facility] on Jun 12, 2017 after she developed rectal bleeding.  CT was done during that visit which showed a splenomegaly, spleen enlarged at 14 cm.  There is an indeterminant 1.5 cm right adrenal mass.  Rectal bleeding was considered to be secondary to diverticulitis. Patient follows up with Dr. Ginette Pitman primary care physician. On being 29 2019.  CBC showed white count 14.3, hemoglobin 9.9, MCV 99.4, immature granulocytes 1.2, reticulocyte percentage 7.78 elevated, platelet count 386,000, differential showed neutrophilia, ANC 9.93, basophils 0.47,  Reviewed patient's previous lab work-up.  She has had leukocytosis since October 2018 with similar differential abnormalities. Patient has also reported easy bruising, no episodes of acute bleeding.  She also had weight loss about 12 pounds for the past 2 to 3 months.  Reports feeling fatigued.   11/06/2017 Bone marrow biopsy aspirate showed hypercellular bone marrow with features of myeloproliferative neoplasm.  Discussed with Dr. Gari Crown All aspirate material is suboptimal but "biopsy touch imprint in the peripheral blood mostly show features of myeloproliferative neoplasm.  Particular the primary myelofibrosis especially in conjunction with patient's previously documented Jak 2 mutation.  No apparent increasing blast cells is seen.  An iron stain performed on touch imprints showed scattered offering centroblast.   Letter finding is of uncertain significance in this setting and may represent a dysplastic component.  Cytogenetics shows normal karyotype.  Foundation heme pending high molecular risk mutation status[ ASXL1, SRSF2, [id number], EZH2, IDH1/2] . GIPSS prognostic model: 1 point or more depending on foundation HEME results.  MIPSS70+ v2.0: She has constitutional symptoms which is 2 points, normal karyotype.  Status of high molecular risk mutational unknown Likely intermediate risk or higher.  # 12/06/2017 started on Jakafi 51m BID.   #Adrenal mass, no need for additional work-up.   12/04/2018 CT abdomen adrenal protocol showed benign bilateral adrenal adenoma.  Mild to moderate splenomegaly.  Chronic hiatal hernia and chronic compression fracture of L1. Coronary atherosclerosis.  Aortic atherosclerosis. Images were independently viewed by me and discussed with patient.   INTERVAL HISTORY Morgan CATHYis a 86y.o. female who has above history reviewed by me today presents for follow up for primary myelofibrosis  Patient was accompanied by her daughter today. Patient reports that she eats pretty well.  Although daughter feels she does not eat healthy and nutritious diet.   She has lost about 3 pounds since last visit. Otherwise no new complaints. Chronic low extremity swelling is better  Review of Systems  Constitutional: Negative for appetite change, chills, fatigue and fever.  HENT:   Negative for hearing loss and voice change.   Eyes: Negative for eye problems.  Respiratory: Negative for chest tightness and cough.   Cardiovascular: Positive for leg swelling. Negative for chest pain.  Gastrointestinal: Negative for abdominal distention, abdominal pain and blood in stool.  Endocrine: Negative for hot flashes.  Genitourinary: Negative for difficulty urinating, dysuria and frequency.   Musculoskeletal: Negative for arthralgias.  Skin: Negative for itching and rash.  Neurological:  Negative  for extremity weakness.  Hematological: Negative for adenopathy.  Psychiatric/Behavioral: Negative for confusion.    MEDICAL HISTORY:  Past Medical History:  Diagnosis Date  . Aneurysm of anterior cerebral artery   . Blindness of left eye    secondary to cataract surgery complication  . Depression   . Hypertension   . SAH (subarachnoid hemorrhage) ([place]) 02/2006  . Tobacco abuse     SURGICAL HISTORY: Past Surgical History:  Procedure Laterality Date  . ABDOMINAL HYSTERECTOMY    . ANEURYSM COILING    . CATARACT EXTRACTION    . CHOLECYSTECTOMY      SOCIAL HISTORY: Social History   Socioeconomic History  . Marital status: Widowed    Spouse name: Not on file  . Number of children: 1  . Years of education: Not on file  . Highest education level: Not on file  Occupational History  . Not on file  Tobacco Use  . Smoking status: Former Smoker    Quit date: 07/31/2015    Years since quitting: 4.1  . Smokeless tobacco: Never Used  Vaping Use  . Vaping Use: Never used  Substance and Sexual Activity  . Alcohol use: Yes    Alcohol/week: 1.0 standard drink    Types: 1 Glasses of wine per week    Comment: once a month  . Drug use: No  . Sexual activity: Not on file  Other Topics Concern  . Not on file  Social History Narrative  . Not on file   Social Determinants of Health   Financial Resource Strain:   . Difficulty of Paying Living Expenses:   Food Insecurity:   . Worried About Charity fundraiser in the Last Year:   . Arboriculturist in the Last Year:   Transportation Needs:   . Film/video editor (Medical):   Marland Kitchen Lack of Transportation (Non-Medical):   Physical Activity:   . Days of Exercise per Week:   . Minutes of Exercise per Session:   Stress:   . Feeling of Stress :   Social Connections:   . Frequency of Communication with Friends and Family:   . Frequency of Social Gatherings with Friends and Family:   . Attends Religious Services:   .  Active Member of Clubs or Organizations:   . Attends Archivist Meetings:   Marland Kitchen Marital Status:   Intimate Partner Violence:   . Fear of Current or Ex-Partner:   . Emotionally Abused:   Marland Kitchen Physically Abused:   . Sexually Abused:     FAMILY HISTORY: Family History  Problem Relation Age of Onset  . Breast cancer Daughter 61  . Breast cancer Sister   . Throat cancer Mother   . Lung cancer Father   . Prostate cancer Brother   . Leukemia Brother   . Prostate cancer Brother   . Leukemia Brother     ALLERGIES:  has No Known Allergies.  MEDICATIONS:  Current Outpatient Medications  Medication Sig Dispense Refill  . amLODipine (NORVASC) 5 MG tablet Take 5 mg by mouth daily.    Marland Kitchen aspirin 81 MG tablet Take 81 mg by mouth daily.    . cefUROXime (CEFTIN) 250 MG tablet Take 1 tablet (250 mg total) by mouth 2 (two) times daily with a meal. (Patient not taking: Reported on 07/01/2019) 10 tablet 0  . citalopram (CELEXA) 20 MG tablet Take 20 mg by mouth daily.    . diphenhydramine-acetaminophen (TYLENOL PM) 25-500 MG TABS tablet Take 1  tablet by mouth at bedtime as needed. Pt splits medication into 3    . ferrous sulfate 325 (65 FE) MG tablet Take 325 mg by mouth once a week. Take 1 tablet on Sundays.    Marland Kitchen JAKAFI 10 MG tablet Take 1 tablet (10 mg total) by mouth 2 (two) times daily. 60 tablet 12  . lisinopril (PRINIVIL,ZESTRIL) 40 MG tablet Take 40 mg by mouth daily.    Marland Kitchen omeprazole (PRILOSEC) 20 MG capsule Take 20 mg by mouth every morning.    . Vitamin D, Ergocalciferol, (DRISDOL) 50000 units CAPS capsule Take 50,000 Units by mouth every Sunday.     No current facility-administered medications for this visit.     PHYSICAL EXAMINATION: ECOG PERFORMANCE STATUS: 1 - Symptomatic but completely ambulatory Vitals:   09/12/19 1130  BP: 103/86  Pulse: 64  Resp: 16  Temp: (!) 96.5 F (35.8 C)  SpO2: 98%   Filed Weights   09/12/19 1130  Weight: 95 lb (43.1 kg)    Physical  Exam Constitutional:      General: She is not in acute distress.    Comments: Thin, walk independently.  HENT:     Head: Normocephalic and atraumatic.  Eyes:     General: No scleral icterus.    Conjunctiva/sclera: Conjunctivae normal.     Pupils: Pupils are equal, round, and reactive to light.  Cardiovascular:     Rate and Rhythm: Normal rate and regular rhythm.     Heart sounds: Normal heart sounds.  Pulmonary:     Effort: Pulmonary effort is normal. No respiratory distress.     Breath sounds: Normal breath sounds. No wheezing or rales.  Chest:     Chest wall: No tenderness.  Abdominal:     General: Bowel sounds are normal. There is no distension.     Palpations: Abdomen is soft. There is no mass.     Tenderness: There is no abdominal tenderness.     Comments: No splenomegaly.   Musculoskeletal:        General: No deformity. Normal range of motion.     Cervical back: Normal range of motion and neck supple.     Comments: Bilateral lower extremity bruising.   Lymphadenopathy:     Cervical: No cervical adenopathy.  Skin:    General: Skin is warm and dry.     Findings: No erythema or rash.  Neurological:     Mental Status: She is alert and oriented to person, place, and time. Mental status is at baseline.     Cranial Nerves: No cranial nerve deficit.     Coordination: Coordination normal.  Psychiatric:        Mood and Affect: Mood normal.        LABORATORY DATA:  I have reviewed the data as listed Lab Results  Component Value Date   WBC 18.3 (H) 07/02/2019   HGB 11.1 (L) 07/02/2019   HCT 36.5 07/02/2019   MCV 99.5 07/02/2019   PLT 290 07/02/2019   Recent Labs    05/06/19 0928 06/04/19 1300 07/02/19 1252  NA 142 143 144  K 4.5 4.5 3.4*  CL 108 105 107  CO2 '27 27 29  ' GLUCOSE 102* 87 154*  BUN '18 17 19  ' CREATININE 1.06* 1.02* 0.89  CALCIUM 9.0 9.1 8.8*  GFRNONAA 47* 49* 58*  GFRAA 54* 57* >60  PROT 7.2 7.4 7.1  ALBUMIN 3.8 4.2 4.1  AST 57* 36 29  ALT  34 26 17  ALKPHOS  128* 95 60  BILITOT 0.8 0.8 0.9    Peripheral blood flow cytometry showed neutrophilia with left shift, less than 1% myeloblasts and basophilia.  CBC and a phenotypic findings suggest a myeloproliferative disorder   # BCR ABL FISH negative. # Jak 2 V617 mutation positive  RADIOGRAPHIC STUDIES: I have personally reviewed the radiological images as listed and agreed with the findings in the report. US Venous Img Lower Unilateral Left  Result Date: 07/02/2019 CLINICAL DATA:  Left lower extremity swelling x1 week EXAM: LEFT LOWER EXTREMITY VENOUS DOPPLER ULTRASOUND TECHNIQUE: Gray-scale sonography with compression, as well as color and duplex ultrasound, were performed to evaluate the deep venous system(s) from the level of the common femoral vein through the popliteal and proximal calf veins. COMPARISON:  None. FINDINGS: VENOUS Normal compressibility of the common femoral, superficial femoral, and popliteal veins, as well as the visualized calf veins. Visualized portions of profunda femoral vein and great saphenous vein unremarkable. No filling defects to suggest DVT on grayscale or color Doppler imaging. Doppler waveforms show normal direction of venous flow, normal respiratory plasticity and response to augmentation. Limited views of the contralateral common femoral vein are unremarkable. OTHER None. Limitations: In the region of the palpable abnormality in the lateral calf there is a ovoid complex hypoechoic collection measuring 4.0 x 0.8 x 2.6 cm within the subcutaneous fat just superficial to the gastrocnemius musculature. No evidence of internal vascularity. Additionally, there is a small hypoechoic fluid collection in the popliteal fossa consistent with a Baker's cyst. IMPRESSION: 1. No evidence of acute deep venous thrombosis. 2. The palpable abnormality corresponds with an ovoid complex fluid collection. Given the history of recent trauma, a hematoma is favored. 3. Small Baker's  cyst. Electronically Signed   By: Jacqulynn Cadet M.D.   On: 07/02/2019 16:44     ASSESSMENT & PLAN:  1. Myelofibrosis ([place])   2. JAK-2 gene mutation   3. Weight loss    # JAK 2 V617 mutation positive, myelofibrosis Patient is doing well clinically Labs are reviewed and discussed with patient. Countering stable.  Leukocytosis has improved.  Close to her baseline. Continue Jakafi.  #Weight loss, lost 3 pounds since last visit. She reports having good appetite however daughter reports that she mostly eats sweets. Refer patient to establish care with nutritionist for further discussion.   We spent sufficient time to discuss many aspect of care, questions were answered to patient and daughters satisfaction. Follow-up in 3 months.   Orders Placed This Encounter  Procedures  . Amb Referral to Nutrition and Diabetic Education    Referral Priority:   Routine    Referral Type:   Consultation    Referral Reason:   Specialty Services Required    Number of Visits Requested:   1    Earlie Server, MD, PhD Hematology Oncology [place] at [facility] 09/12/2019

## 2019-09-12 NOTE — Progress Notes (Signed)
Patient denies new problems/concerns today.   °

## 2019-09-30 ENCOUNTER — Telehealth: Payer: Self-pay | Admitting: *Deleted

## 2019-09-30 ENCOUNTER — Inpatient Hospital Stay: Payer: Medicare Other

## 2019-09-30 NOTE — Telephone Encounter (Signed)
Called pt to see if she wanted to R/S her 09/30/19 No Show NUT appt.  Pt stated that she doesn't wish to have appt R/S at this time

## 2019-09-30 NOTE — Progress Notes (Signed)
Nutrition  Patient was a no show to nutrition appointment today at 1:45pm.  Message sent to scheduling to offer another appointment and provider notified.   Shayana Hornstein B. Zenia Resides, [facility], [place] Registered Dietitian [phone] (mobile)

## 2019-12-11 ENCOUNTER — Other Ambulatory Visit: Payer: Self-pay

## 2019-12-11 DIAGNOSIS — D7581 Myelofibrosis: Secondary | ICD-10-CM

## 2019-12-12 ENCOUNTER — Inpatient Hospital Stay (HOSPITAL_BASED_OUTPATIENT_CLINIC_OR_DEPARTMENT_OTHER): Payer: Medicare Other | Admitting: Oncology

## 2019-12-12 ENCOUNTER — Encounter: Payer: Self-pay | Admitting: Oncology

## 2019-12-12 ENCOUNTER — Inpatient Hospital Stay: Payer: Medicare Other | Attending: Oncology

## 2019-12-12 VITALS — BP 183/80 | HR 65 | Temp 96.8°F | Resp 16 | Wt 91.9 lb

## 2019-12-12 DIAGNOSIS — R161 Splenomegaly, not elsewhere classified: Secondary | ICD-10-CM | POA: Insufficient documentation

## 2019-12-12 DIAGNOSIS — M4856XA Collapsed vertebra, not elsewhere classified, lumbar region, initial encounter for fracture: Secondary | ICD-10-CM | POA: Insufficient documentation

## 2019-12-12 DIAGNOSIS — Z808 Family history of malignant neoplasm of other organs or systems: Secondary | ICD-10-CM | POA: Insufficient documentation

## 2019-12-12 DIAGNOSIS — Z803 Family history of malignant neoplasm of breast: Secondary | ICD-10-CM | POA: Insufficient documentation

## 2019-12-12 DIAGNOSIS — R5383 Other fatigue: Secondary | ICD-10-CM | POA: Insufficient documentation

## 2019-12-12 DIAGNOSIS — D471 Chronic myeloproliferative disease: Secondary | ICD-10-CM | POA: Insufficient documentation

## 2019-12-12 DIAGNOSIS — R634 Abnormal weight loss: Secondary | ICD-10-CM | POA: Diagnosis not present

## 2019-12-12 DIAGNOSIS — K449 Diaphragmatic hernia without obstruction or gangrene: Secondary | ICD-10-CM | POA: Diagnosis not present

## 2019-12-12 DIAGNOSIS — Z87891 Personal history of nicotine dependence: Secondary | ICD-10-CM | POA: Insufficient documentation

## 2019-12-12 DIAGNOSIS — I2510 Atherosclerotic heart disease of native coronary artery without angina pectoris: Secondary | ICD-10-CM | POA: Insufficient documentation

## 2019-12-12 DIAGNOSIS — Z1589 Genetic susceptibility to other disease: Secondary | ICD-10-CM

## 2019-12-12 DIAGNOSIS — Z801 Family history of malignant neoplasm of trachea, bronchus and lung: Secondary | ICD-10-CM | POA: Insufficient documentation

## 2019-12-12 DIAGNOSIS — I700 Atherosclerosis of aorta: Secondary | ICD-10-CM | POA: Diagnosis not present

## 2019-12-12 DIAGNOSIS — Z90710 Acquired absence of both cervix and uterus: Secondary | ICD-10-CM | POA: Diagnosis not present

## 2019-12-12 DIAGNOSIS — D7581 Myelofibrosis: Secondary | ICD-10-CM | POA: Diagnosis not present

## 2019-12-12 LAB — CBC WITH DIFFERENTIAL/PLATELET
Abs Immature Granulocytes: 3.8 10*3/uL — ABNORMAL HIGH (ref 0.00–0.07)
Band Neutrophils: 6 %
Basophils Absolute: 0.5 10*3/uL — ABNORMAL HIGH (ref 0.0–0.1)
Basophils Relative: 3 %
Blasts: 3 %
Eosinophils Absolute: 0.2 10*3/uL (ref 0.0–0.5)
Eosinophils Relative: 1 %
HCT: 36.6 % (ref 36.0–46.0)
Hemoglobin: 11.2 g/dL — ABNORMAL LOW (ref 12.0–15.0)
Lymphocytes Relative: 5 %
Lymphs Abs: 0.9 10*3/uL (ref 0.7–4.0)
MCH: 30.8 pg (ref 26.0–34.0)
MCHC: 30.6 g/dL (ref 30.0–36.0)
MCV: 100.5 fL — ABNORMAL HIGH (ref 80.0–100.0)
Metamyelocytes Relative: 9 %
Monocytes Absolute: 0.5 10*3/uL (ref 0.1–1.0)
Monocytes Relative: 3 %
Myelocytes: 12 %
Neutro Abs: 11.5 10*3/uL — ABNORMAL HIGH (ref 1.7–7.7)
Neutrophils Relative %: 58 %
Platelets: 350 10*3/uL (ref 150–400)
RBC: 3.64 MIL/uL — ABNORMAL LOW (ref 3.87–5.11)
RDW: 21.9 % — ABNORMAL HIGH (ref 11.5–15.5)
Smear Review: ADEQUATE
WBC: 18 10*3/uL — ABNORMAL HIGH (ref 4.0–10.5)
nRBC: 12.1 % — ABNORMAL HIGH (ref 0.0–0.2)

## 2019-12-12 LAB — COMPREHENSIVE METABOLIC PANEL
ALT: 16 U/L (ref 0–44)
AST: 28 U/L (ref 15–41)
Albumin: 4.2 g/dL (ref 3.5–5.0)
Alkaline Phosphatase: 58 U/L (ref 38–126)
Anion gap: 9 (ref 5–15)
BUN: 12 mg/dL (ref 8–23)
CO2: 28 mmol/L (ref 22–32)
Calcium: 9.2 mg/dL (ref 8.9–10.3)
Chloride: 105 mmol/L (ref 98–111)
Creatinine, Ser: 0.85 mg/dL (ref 0.44–1.00)
GFR, Estimated: >60 mL/min (ref >60)
Glucose, Bld: 105 mg/dL — ABNORMAL HIGH (ref 70–99)
Potassium: 4.6 mmol/L (ref 3.5–5.1)
Sodium: 142 mmol/L (ref 135–145)
Total Bilirubin: 1.1 mg/dL (ref 0.3–1.2)
Total Protein: 7.2 g/dL (ref 6.5–8.1)

## 2019-12-12 MED ORDER — RUXOLITINIB PHOSPHATE 15 MG PO TABS
15.0000 mg | ORAL_TABLET | Freq: Two times a day (BID) | ORAL | 3 refills | Status: DC
Start: 2019-12-12 — End: 2020-05-19

## 2019-12-12 NOTE — Progress Notes (Signed)
Patient has been evaluated by eye dr and was diagnosed with vision loss in left eye due to scarring from a cataract surgery performed years ago.

## 2019-12-12 NOTE — Progress Notes (Signed)
Hematology/Oncology Follow up note [facility] Telephone:(336) [phone] [phone]   Patient Care Team: Tracie Harrier, MD as PCP - General (Internal Medicine)  REFERRING PROVIDER: Tracie Harrier, MD  REASON FOR VISIT Follow up for treatment of myelofibrosis  HISTORY OF PRESENTING ILLNESS:  [name] is a  86 y.o.  female with PMH listed below who was referred to me for evaluation of leukocytosis and splenomegaly. Patient was recently admitted to outside facility [facility] in [facility] on Jun 12, 2017 after she developed rectal bleeding.  CT was done during that visit which showed a splenomegaly, spleen enlarged at 14 cm.  There is an indeterminant 1.5 cm right adrenal mass.  Rectal bleeding was considered to be secondary to diverticulitis. Patient follows up with Dr. Ginette Pitman primary care physician. On being 29 2019.  CBC showed white count 14.3, hemoglobin 9.9, MCV 99.4, immature granulocytes 1.2, reticulocyte percentage 7.78 elevated, platelet count 386,000, differential showed neutrophilia, ANC 9.93, basophils 0.47,  Reviewed patient's previous lab work-up.  She has had leukocytosis since October 2018 with similar differential abnormalities. Patient has also reported easy bruising, no episodes of acute bleeding.  She also had weight loss about 12 pounds for the past 2 to 3 months.  Reports feeling fatigued.   11/06/2017 Bone marrow biopsy aspirate showed hypercellular bone marrow with features of myeloproliferative neoplasm.  Discussed with Dr. Gari Crown All aspirate material is suboptimal but "biopsy touch imprint in the peripheral blood mostly show features of myeloproliferative neoplasm.  Particular the primary myelofibrosis especially in conjunction with patient's previously documented Jak 2 mutation.  No apparent increasing blast cells is seen.  An iron stain performed on touch imprints showed scattered offering centroblast.   Letter finding is of uncertain significance in this setting and may represent a dysplastic component.  Cytogenetics shows normal karyotype.  Foundation heme pending high molecular risk mutation status[ ASXL1, SRSF2, A6007029, EZH2, IDH1/2] . GIPSS prognostic model: 1 point or more depending on foundation HEME results.  MIPSS70+ v2.0: She has constitutional symptoms which is 2 points, normal karyotype.  Status of high molecular risk mutational unknown Likely intermediate risk or higher.  # 12/06/2017 started on Jakafi 7m BID.   #Adrenal mass, no need for additional work-up.   12/04/2018 CT abdomen adrenal protocol showed benign bilateral adrenal adenoma.  Mild to moderate splenomegaly.  Chronic hiatal hernia and chronic compression fracture of L1. Coronary atherosclerosis.  Aortic atherosclerosis. Images were independently viewed by me and discussed with patient.   INTERVAL HISTORY [name] a 86y.o. female who has above history reviewed by me today presents for follow up for primary myelofibrosis  Patient was accompanied by her daughter today. Patient eats well however continues to lose weight.  Appetite is good.  Denies any night sweats.  Energy level is at baseline.    Review of Systems  Constitutional: Positive for unexpected weight change. Negative for appetite change, chills, fatigue and fever.  HENT:   Negative for hearing loss and voice change.   Eyes: Negative for eye problems.  Respiratory: Negative for chest tightness and cough.   Cardiovascular: Negative for chest pain and leg swelling.  Gastrointestinal: Negative for abdominal distention, abdominal pain and blood in stool.  Endocrine: Negative for hot flashes.  Genitourinary: Negative for difficulty urinating, dysuria and frequency.   Musculoskeletal: Negative for arthralgias.  Skin: Negative for itching and rash.  Neurological: Negative for extremity weakness.  Hematological: Negative for adenopathy.    Psychiatric/Behavioral:  Negative for confusion.    MEDICAL HISTORY:  Past Medical History:  Diagnosis Date  . Aneurysm of anterior cerebral artery   . Blindness of left eye    secondary to cataract surgery complication  . Depression   . Hypertension   . Myelofibrosis ([place])   . SAH (subarachnoid hemorrhage) ([place]) 02/2006  . Tobacco abuse     SURGICAL HISTORY: Past Surgical History:  Procedure Laterality Date  . ABDOMINAL HYSTERECTOMY    . ANEURYSM COILING    . CATARACT EXTRACTION    . CHOLECYSTECTOMY      SOCIAL HISTORY: Social History   Socioeconomic History  . Marital status: Widowed    Spouse name: Not on file  . Number of children: 1  . Years of education: Not on file  . Highest education level: Not on file  Occupational History  . Not on file  Tobacco Use  . Smoking status: Former Smoker    Quit date: 07/31/2015    Years since quitting: 4.3  . Smokeless tobacco: Never Used  Vaping Use  . Vaping Use: Never used  Substance and Sexual Activity  . Alcohol use: Yes    Alcohol/week: 1.0 standard drink    Types: 1 Glasses of wine per week    Comment: once a month  . Drug use: No  . Sexual activity: Not on file  Other Topics Concern  . Not on file  Social History Narrative  . Not on file   Social Determinants of Health   Financial Resource Strain:   . Difficulty of Paying Living Expenses: Not on file  Food Insecurity:   . Worried About Charity fundraiser in the Last Year: Not on file  . Ran Out of Food in the Last Year: Not on file  Transportation Needs:   . Lack of Transportation (Medical): Not on file  . Lack of Transportation (Non-Medical): Not on file  Physical Activity:   . Days of Exercise per Week: Not on file  . Minutes of Exercise per Session: Not on file  Stress:   . Feeling of Stress : Not on file  Social Connections:   . Frequency of Communication with Friends and Family: Not on file  . Frequency of Social Gatherings with Friends and  Family: Not on file  . Attends Religious Services: Not on file  . Active Member of Clubs or Organizations: Not on file  . Attends Archivist Meetings: Not on file  . Marital Status: Not on file  Intimate Partner Violence:   . Fear of Current or Ex-Partner: Not on file  . Emotionally Abused: Not on file  . Physically Abused: Not on file  . Sexually Abused: Not on file    FAMILY HISTORY: Family History  Problem Relation Age of Onset  . Breast cancer Daughter 70  . Breast cancer Sister   . Throat cancer Mother   . Lung cancer Father   . Prostate cancer Brother   . Leukemia Brother   . Prostate cancer Brother   . Leukemia Brother     ALLERGIES:  has No Known Allergies.  MEDICATIONS:  Current Outpatient Medications  Medication Sig Dispense Refill  . amLODipine (NORVASC) 5 MG tablet Take 5 mg by mouth daily.    Marland Kitchen aspirin 81 MG tablet Take 81 mg by mouth daily.    . citalopram (CELEXA) 20 MG tablet Take 20 mg by mouth daily.    . diphenhydramine-acetaminophen (TYLENOL PM) 25-500 MG TABS tablet Take 1  tablet by mouth at bedtime as needed. Pt splits medication into 3    . ferrous sulfate 325 (65 FE) MG tablet Take 325 mg by mouth once a week. Take 1 tablet on Sundays.    Marland Kitchen JAKAFI 10 MG tablet Take 1 tablet (10 mg total) by mouth 2 (two) times daily. 60 tablet 12  . lisinopril (PRINIVIL,ZESTRIL) 40 MG tablet Take 40 mg by mouth daily.    Marland Kitchen omeprazole (PRILOSEC) 20 MG capsule Take 20 mg by mouth every morning.    . Vitamin D, Ergocalciferol, (DRISDOL) 50000 units CAPS capsule Take 50,000 Units by mouth every Sunday.    . cefUROXime (CEFTIN) 250 MG tablet Take 1 tablet (250 mg total) by mouth 2 (two) times daily with a meal. (Patient not taking: Reported on 07/01/2019) 10 tablet 0   No current facility-administered medications for this visit.     PHYSICAL EXAMINATION: ECOG PERFORMANCE STATUS: 1 - Symptomatic but completely ambulatory Vitals:   12/12/19 1022  BP: (!)  183/80  Pulse: 65  Resp: 16  Temp: (!) 96.8 F (36 C)   Filed Weights   12/12/19 1022  Weight: 91 lb 14.4 oz (41.7 kg)    Physical Exam Constitutional:      General: She is not in acute distress.    Comments: Thin, walk independently.  HENT:     Head: Normocephalic and atraumatic.  Eyes:     General: No scleral icterus.    Conjunctiva/sclera: Conjunctivae normal.     Pupils: Pupils are equal, round, and reactive to light.  Cardiovascular:     Rate and Rhythm: Normal rate and regular rhythm.     Heart sounds: Normal heart sounds.  Pulmonary:     Effort: Pulmonary effort is normal. No respiratory distress.     Breath sounds: Normal breath sounds. No wheezing or rales.  Chest:     Chest wall: No tenderness.  Abdominal:     General: Bowel sounds are normal. There is no distension.     Palpations: Abdomen is soft. There is no mass.     Tenderness: There is no abdominal tenderness.     Comments: No splenomegaly.   Musculoskeletal:        General: No deformity. Normal range of motion.     Cervical back: Normal range of motion and neck supple.  Lymphadenopathy:     Cervical: No cervical adenopathy.  Skin:    General: Skin is warm and dry.     Findings: No erythema or rash.  Neurological:     Mental Status: She is alert and oriented to person, place, and time. Mental status is at baseline.     Cranial Nerves: No cranial nerve deficit.     Coordination: Coordination normal.  Psychiatric:        Mood and Affect: Mood normal.        LABORATORY DATA:  I have reviewed the data as listed Lab Results  Component Value Date   WBC 18.0 (H) 12/12/2019   HGB 11.2 (L) 12/12/2019   HCT 36.6 12/12/2019   MCV 100.5 (H) 12/12/2019   PLT 350 12/12/2019   Recent Labs    06/04/19 1300 06/04/19 1300 07/02/19 1252 09/12/19 1114 12/12/19 1006  NA 143   < > 144 142 142  K 4.5   < > 3.4* 3.9 4.6  CL 105   < > 107 104 105  CO2 27   < > '29 30 28  ' GLUCOSE 87   < >  154* 89 105*    BUN 17   < > '19 15 12  ' CREATININE 1.02*   < > 0.89 0.70 0.85  CALCIUM 9.1   < > 8.8* 8.7* 9.2  GFRNONAA 49*   < > 58* >60 >60  GFRAA 57*  --  >60 >60  --   PROT 7.4   < > 7.1 6.9 7.2  ALBUMIN 4.2   < > 4.1 4.2 4.2  AST 36   < > '29 28 28  ' ALT 26   < > '17 16 16  ' ALKPHOS 95   < > 60 54 58  BILITOT 0.8   < > 0.9 0.9 1.1   < > = values in this interval not displayed.    Peripheral blood flow cytometry showed neutrophilia with left shift, less than 1% myeloblasts and basophilia.  CBC and a phenotypic findings suggest a myeloproliferative disorder   # BCR ABL FISH negative. # Jak 2 V617 mutation positive  RADIOGRAPHIC STUDIES: I have personally reviewed the radiological images as listed and agreed with the findings in the report. No results found.   ASSESSMENT & PLAN:  1. Myelofibrosis ([place])    # JAK 2 V617 mutation positive, myelofibrosis Labs are reviewed and discussed with patient. Counts are stable. Leukocytosis has slightly worsened. Patient continues to have unintentional weight loss. Discussed with her about nutrition supplementation twice a day.  Patient declines being referred to nutritionist.  I will increase Jakafi to 15 mg twice daily Close monitor.   We spent sufficient time to discuss many aspect of care, questions were answered to patient and daughters satisfaction. Follow-up in 4 weeks  Orders Placed This Encounter  Procedures  . Comprehensive metabolic panel    Standing Status:   Future    Standing Expiration Date:   12/11/2020  . CBC with Differential/Platelet    Standing Status:   Future    Standing Expiration Date:   12/11/2020    Earlie Server, MD, PhD Hematology Oncology [place] at [facility] 12/12/2019

## 2020-01-10 ENCOUNTER — Inpatient Hospital Stay: Payer: Medicare Other | Attending: Oncology

## 2020-01-10 ENCOUNTER — Inpatient Hospital Stay (HOSPITAL_BASED_OUTPATIENT_CLINIC_OR_DEPARTMENT_OTHER): Payer: Medicare Other | Admitting: Oncology

## 2020-01-10 ENCOUNTER — Encounter: Payer: Self-pay | Admitting: Oncology

## 2020-01-10 VITALS — BP 177/71 | HR 71 | Temp 98.7°F | Wt 90.9 lb

## 2020-01-10 DIAGNOSIS — R161 Splenomegaly, not elsewhere classified: Secondary | ICD-10-CM | POA: Insufficient documentation

## 2020-01-10 DIAGNOSIS — Z806 Family history of leukemia: Secondary | ICD-10-CM | POA: Insufficient documentation

## 2020-01-10 DIAGNOSIS — R634 Abnormal weight loss: Secondary | ICD-10-CM

## 2020-01-10 DIAGNOSIS — Z90710 Acquired absence of both cervix and uterus: Secondary | ICD-10-CM | POA: Insufficient documentation

## 2020-01-10 DIAGNOSIS — I2510 Atherosclerotic heart disease of native coronary artery without angina pectoris: Secondary | ICD-10-CM | POA: Diagnosis not present

## 2020-01-10 DIAGNOSIS — Z801 Family history of malignant neoplasm of trachea, bronchus and lung: Secondary | ICD-10-CM | POA: Insufficient documentation

## 2020-01-10 DIAGNOSIS — Z87891 Personal history of nicotine dependence: Secondary | ICD-10-CM | POA: Diagnosis not present

## 2020-01-10 DIAGNOSIS — K449 Diaphragmatic hernia without obstruction or gangrene: Secondary | ICD-10-CM | POA: Insufficient documentation

## 2020-01-10 DIAGNOSIS — M4856XA Collapsed vertebra, not elsewhere classified, lumbar region, initial encounter for fracture: Secondary | ICD-10-CM | POA: Insufficient documentation

## 2020-01-10 DIAGNOSIS — D72829 Elevated white blood cell count, unspecified: Secondary | ICD-10-CM | POA: Diagnosis not present

## 2020-01-10 DIAGNOSIS — I700 Atherosclerosis of aorta: Secondary | ICD-10-CM | POA: Diagnosis not present

## 2020-01-10 DIAGNOSIS — Z803 Family history of malignant neoplasm of breast: Secondary | ICD-10-CM | POA: Diagnosis not present

## 2020-01-10 DIAGNOSIS — Z1589 Genetic susceptibility to other disease: Secondary | ICD-10-CM

## 2020-01-10 DIAGNOSIS — D7581 Myelofibrosis: Secondary | ICD-10-CM | POA: Diagnosis present

## 2020-01-10 DIAGNOSIS — Z5111 Encounter for antineoplastic chemotherapy: Secondary | ICD-10-CM | POA: Diagnosis not present

## 2020-01-10 DIAGNOSIS — Z808 Family history of malignant neoplasm of other organs or systems: Secondary | ICD-10-CM | POA: Diagnosis not present

## 2020-01-10 LAB — COMPREHENSIVE METABOLIC PANEL
ALT: 22 U/L (ref 0–44)
AST: 36 U/L (ref 15–41)
Albumin: 4.3 g/dL (ref 3.5–5.0)
Alkaline Phosphatase: 43 U/L (ref 38–126)
Anion gap: 11 (ref 5–15)
BUN: 22 mg/dL (ref 8–23)
CO2: 27 mmol/L (ref 22–32)
Calcium: 8.9 mg/dL (ref 8.9–10.3)
Chloride: 103 mmol/L (ref 98–111)
Creatinine, Ser: 0.76 mg/dL (ref 0.44–1.00)
GFR, Estimated: >60 mL/min (ref >60)
Glucose, Bld: 96 mg/dL (ref 70–99)
Potassium: 3.9 mmol/L (ref 3.5–5.1)
Sodium: 141 mmol/L (ref 135–145)
Total Bilirubin: 0.8 mg/dL (ref 0.3–1.2)
Total Protein: 7.3 g/dL (ref 6.5–8.1)

## 2020-01-10 LAB — CBC WITH DIFFERENTIAL/PLATELET
Abs Immature Granulocytes: 4.11 10*3/uL — ABNORMAL HIGH (ref 0.00–0.07)
Basophils Absolute: 1 10*3/uL — ABNORMAL HIGH (ref 0.0–0.1)
Basophils Relative: 6 %
Eosinophils Absolute: 0.1 10*3/uL (ref 0.0–0.5)
Eosinophils Relative: 0 %
HCT: 37.6 % (ref 36.0–46.0)
Hemoglobin: 11.3 g/dL — ABNORMAL LOW (ref 12.0–15.0)
Immature Granulocytes: 23 %
Lymphocytes Relative: 10 %
Lymphs Abs: 1.7 10*3/uL (ref 0.7–4.0)
MCH: 30.5 pg (ref 26.0–34.0)
MCHC: 30.1 g/dL (ref 30.0–36.0)
MCV: 101.3 fL — ABNORMAL HIGH (ref 80.0–100.0)
Monocytes Absolute: 1.1 10*3/uL — ABNORMAL HIGH (ref 0.1–1.0)
Monocytes Relative: 6 %
Neutro Abs: 9.8 10*3/uL — ABNORMAL HIGH (ref 1.7–7.7)
Neutrophils Relative %: 55 %
Platelets: 301 10*3/uL (ref 150–400)
RBC: 3.71 MIL/uL — ABNORMAL LOW (ref 3.87–5.11)
RDW: 20.4 % — ABNORMAL HIGH (ref 11.5–15.5)
Smear Review: NORMAL
WBC: 17.8 10*3/uL — ABNORMAL HIGH (ref 4.0–10.5)
nRBC: 6.9 % — ABNORMAL HIGH (ref 0.0–0.2)

## 2020-01-10 NOTE — Progress Notes (Signed)
Pt here for follow up. No new concerns voiced.   

## 2020-01-10 NOTE — Progress Notes (Signed)
Hematology/Oncology Follow up note [facility] Telephone:(336) [phone] Fax:(336) [phone]   Patient Care Team: Tracie Harrier, MD as PCP - General (Internal Medicine)  REFERRING PROVIDER: Tracie Harrier, MD  REASON FOR VISIT Follow up for treatment of myelofibrosis  HISTORY OF PRESENTING ILLNESS:  [name] is a  86 y.o.  female with PMH listed below who was referred to me for evaluation of leukocytosis and splenomegaly. Patient was recently admitted to outside facility [facility] in [facility] on Jun 12, 2017 after she developed rectal bleeding.  CT was done during that visit which showed a splenomegaly, spleen enlarged at 14 cm.  There is an indeterminant 1.5 cm right adrenal mass.  Rectal bleeding was considered to be secondary to diverticulitis. Patient follows up with Dr. Ginette Pitman primary care physician. On being 29 2019.  CBC showed white count 14.3, hemoglobin 9.9, MCV 99.4, immature granulocytes 1.2, reticulocyte percentage 7.78 elevated, platelet count 386,000, differential showed neutrophilia, ANC 9.93, basophils 0.47,  Reviewed patient's previous lab work-up.  She has had leukocytosis since October 2018 with similar differential abnormalities. Patient has also reported easy bruising, no episodes of acute bleeding.  She also had weight loss about 12 pounds for the past 2 to 3 months.  Reports feeling fatigued.   11/06/2017 Bone marrow biopsy aspirate showed hypercellular bone marrow with features of myeloproliferative neoplasm.  Discussed with Dr. Gari Crown All aspirate material is suboptimal but "biopsy touch imprint in the peripheral blood mostly show features of myeloproliferative neoplasm.  Particular the primary myelofibrosis especially in conjunction with patient's previously documented Jak 2 mutation.  No apparent increasing blast cells is seen.  An iron stain performed on touch imprints showed scattered offering centroblast.   Letter finding is of uncertain significance in this setting and may represent a dysplastic component.  Cytogenetics shows normal karyotype.  Foundation heme pending high molecular risk mutation status[ ASXL1, SRSF2, A6007029, EZH2, IDH1/2] . GIPSS prognostic model: 1 point or more depending on foundation HEME results.  MIPSS70+ v2.0: She has constitutional symptoms which is 2 points, normal karyotype.  Status of high molecular risk mutational unknown Likely intermediate risk or higher.  # 12/06/2017 started on Jakafi 28m BID.   #Adrenal mass, no need for additional work-up.   12/04/2018 CT abdomen adrenal protocol showed benign bilateral adrenal adenoma.  Mild to moderate splenomegaly.  Chronic hiatal hernia and chronic compression fracture of L1. Coronary atherosclerosis.  Aortic atherosclerosis. Images were independently viewed by me and discussed with patient.   INTERVAL HISTORY Morgan STRATTONis a 86y.o. female who has above history reviewed by me today presents for follow up for primary myelofibrosis  Patient was accompanied by her daughter today. Appetite is good. She lost one pound since last visit  No night sweats.    Review of Systems  Constitutional: Positive for unexpected weight change. Negative for appetite change, chills, fatigue and fever.  HENT:   Negative for hearing loss and voice change.   Eyes: Negative for eye problems.  Respiratory: Negative for chest tightness and cough.   Cardiovascular: Negative for chest pain and leg swelling.  Gastrointestinal: Negative for abdominal distention, abdominal pain and blood in stool.  Endocrine: Negative for hot flashes.  Genitourinary: Negative for difficulty urinating, dysuria and frequency.   Musculoskeletal: Negative for arthralgias.  Skin: Negative for itching and rash.  Neurological: Negative for extremity weakness.  Hematological: Negative for adenopathy.  Psychiatric/Behavioral: Negative for confusion.     MEDICAL HISTORY:  Past  Medical History:  Diagnosis Date  . Aneurysm of anterior cerebral artery   . Blindness of left eye    secondary to cataract surgery complication  . Depression   . Hypertension   . Myelofibrosis ([place])   . SAH (subarachnoid hemorrhage) ([place]) 02/2006  . Tobacco abuse     SURGICAL HISTORY: Past Surgical History:  Procedure Laterality Date  . ABDOMINAL HYSTERECTOMY    . ANEURYSM COILING    . CATARACT EXTRACTION    . CHOLECYSTECTOMY      SOCIAL HISTORY: Social History   Socioeconomic History  . Marital status: Widowed    Spouse name: Not on file  . Number of children: 1  . Years of education: Not on file  . Highest education level: Not on file  Occupational History  . Not on file  Tobacco Use  . Smoking status: Former Smoker    Quit date: 07/31/2015    Years since quitting: 4.4  . Smokeless tobacco: Never Used  Vaping Use  . Vaping Use: Never used  Substance and Sexual Activity  . Alcohol use: Yes    Alcohol/week: 1.0 standard drink    Types: 1 Glasses of wine per week    Comment: once a month  . Drug use: No  . Sexual activity: Not on file  Other Topics Concern  . Not on file  Social History Narrative  . Not on file   Social Determinants of Health   Financial Resource Strain:   . Difficulty of Paying Living Expenses: Not on file  Food Insecurity:   . Worried About Charity fundraiser in the Last Year: Not on file  . Ran Out of Food in the Last Year: Not on file  Transportation Needs:   . Lack of Transportation (Medical): Not on file  . Lack of Transportation (Non-Medical): Not on file  Physical Activity:   . Days of Exercise per Week: Not on file  . Minutes of Exercise per Session: Not on file  Stress:   . Feeling of Stress : Not on file  Social Connections:   . Frequency of Communication with Friends and Family: Not on file  . Frequency of Social Gatherings with Friends and Family: Not on file  . Attends Religious Services:  Not on file  . Active Member of Clubs or Organizations: Not on file  . Attends Archivist Meetings: Not on file  . Marital Status: Not on file  Intimate Partner Violence:   . Fear of Current or Ex-Partner: Not on file  . Emotionally Abused: Not on file  . Physically Abused: Not on file  . Sexually Abused: Not on file    FAMILY HISTORY: Family History  Problem Relation Age of Onset  . Breast cancer Daughter 38  . Breast cancer Sister   . Throat cancer Mother   . Lung cancer Father   . Prostate cancer Brother   . Leukemia Brother   . Prostate cancer Brother   . Leukemia Brother     ALLERGIES:  has No Known Allergies.  MEDICATIONS:  Current Outpatient Medications  Medication Sig Dispense Refill  . amLODipine (NORVASC) 5 MG tablet Take 5 mg by mouth daily.    Marland Kitchen aspirin 81 MG tablet Take 81 mg by mouth daily.    . citalopram (CELEXA) 20 MG tablet Take 20 mg by mouth daily.    . diphenhydramine-acetaminophen (TYLENOL PM) 25-500 MG TABS tablet Take 1 tablet by mouth at bedtime as needed. Pt splits medication  into 3    . ferrous sulfate 325 (65 FE) MG tablet Take 325 mg by mouth once a week. Take 1 tablet on Sundays.    Marland Kitchen lisinopril (PRINIVIL,ZESTRIL) 40 MG tablet Take 40 mg by mouth daily.    Marland Kitchen omeprazole (PRILOSEC) 20 MG capsule Take 20 mg by mouth every morning.    . ruxolitinib phosphate (JAKAFI) 15 MG tablet Take 1 tablet (15 mg total) by mouth 2 (two) times daily. 60 tablet 3  . Vitamin D, Ergocalciferol, (DRISDOL) 50000 units CAPS capsule Take 50,000 Units by mouth every Sunday.     No current facility-administered medications for this visit.     PHYSICAL EXAMINATION: ECOG PERFORMANCE STATUS: 1 - Symptomatic but completely ambulatory Vitals:   01/10/20 1058  BP: (!) 177/71  Pulse: 71  Temp: 98.7 F (37.1 C)  SpO2: 94%   Filed Weights   01/10/20 1058  Weight: 90 lb 14.4 oz (41.2 kg)    Physical Exam Constitutional:      General: She is not in acute  distress.    Comments: Thin, walk independently.  HENT:     Head: Normocephalic and atraumatic.  Eyes:     General: No scleral icterus.    Conjunctiva/sclera: Conjunctivae normal.     Pupils: Pupils are equal, round, and reactive to light.  Cardiovascular:     Rate and Rhythm: Normal rate and regular rhythm.     Heart sounds: Normal heart sounds.  Pulmonary:     Effort: Pulmonary effort is normal. No respiratory distress.     Breath sounds: Normal breath sounds. No wheezing or rales.  Chest:     Chest wall: No tenderness.  Abdominal:     General: Bowel sounds are normal. There is no distension.     Palpations: Abdomen is soft. There is no mass.     Tenderness: There is no abdominal tenderness.     Comments: No splenomegaly.   Musculoskeletal:        General: No deformity. Normal range of motion.     Cervical back: Normal range of motion and neck supple.  Lymphadenopathy:     Cervical: No cervical adenopathy.  Skin:    General: Skin is warm and dry.     Findings: No erythema or rash.  Neurological:     Mental Status: She is alert and oriented to person, place, and time. Mental status is at baseline.     Cranial Nerves: No cranial nerve deficit.     Coordination: Coordination normal.  Psychiatric:        Mood and Affect: Mood normal.        LABORATORY DATA:  I have reviewed the data as listed Lab Results  Component Value Date   WBC 17.8 (H) 01/10/2020   HGB 11.3 (L) 01/10/2020   HCT 37.6 01/10/2020   MCV 101.3 (H) 01/10/2020   PLT 301 01/10/2020   Recent Labs    06/04/19 1300 06/04/19 1300 07/02/19 1252 07/02/19 1252 09/12/19 1114 12/12/19 1006 01/10/20 1004  NA 143   < > 144   < > 142 142 141  K 4.5   < > 3.4*   < > 3.9 4.6 3.9  CL 105   < > 107   < > 104 105 103  CO2 27   < > 29   < > '30 28 27  ' GLUCOSE 87   < > 154*   < > 89 105* 96  BUN 17   < > 19   < >  '15 12 22  ' CREATININE 1.02*   < > 0.89   < > 0.70 0.85 0.76  CALCIUM 9.1   < > 8.8*   < > 8.7*  9.2 8.9  GFRNONAA 49*   < > 58*   < > >60 >60 >60  GFRAA 57*  --  >60  --  >60  --   --   PROT 7.4   < > 7.1   < > 6.9 7.2 7.3  ALBUMIN 4.2   < > 4.1   < > 4.2 4.2 4.3  AST 36   < > 29   < > 28 28 36  ALT 26   < > 17   < > '16 16 22  ' ALKPHOS 95   < > 60   < > 54 58 43  BILITOT 0.8   < > 0.9   < > 0.9 1.1 0.8   < > = values in this interval not displayed.    Peripheral blood flow cytometry showed neutrophilia with left shift, less than 1% myeloblasts and basophilia.  CBC and a phenotypic findings suggest a myeloproliferative disorder   # BCR ABL FISH negative. # Jak 2 V617 mutation positive  RADIOGRAPHIC STUDIES: I have personally reviewed the radiological images as listed and agreed with the findings in the report. No results found.   ASSESSMENT & PLAN:  1. Myelofibrosis ([place])   2. JAK-2 gene mutation   3. Weight loss   4. Encounter for antineoplastic chemotherapy    # JAK 2 V617 mutation positive, myelofibrosis Labs are reviewed and discussed with patient. Leukocytosis has slightly improved.  She has been on Jakafi 101m BID for 1 week, so far tolerates well.  Continue with current regimen.   Weight loss, today weight is fairly stable. Continue nutritional supplementation.    We spent sufficient time to discuss many aspect of care, questions were answered to patient and daughters satisfaction. Follow-up in 8 weeks  No orders of the defined types were placed in this encounter.   ZEarlie Server MD, PhD [facility]ematology [facility] [place] [facility]12/04/2019

## 2020-03-16 ENCOUNTER — Telehealth: Payer: Self-pay | Admitting: Pharmacy Technician

## 2020-03-17 NOTE — Telephone Encounter (Signed)
Oral Oncology Patient Advocate Encounter  Prior Authorization for [name] has been approved.    PA# [id number] Effective dates: 03/16/20 through 03/16/21  Oral Oncology Clinic will continue to follow.   [place] Patient [place] Phone [phone] Fax [phone] 03/17/2020 11:14 AM

## 2020-03-17 NOTE — Telephone Encounter (Signed)
Oral Oncology Patient Advocate Encounter  Received notification from [employer] that prior authorization for Shanon Brow is expiring and needs to be renewed.  PA questionnaire faxed on 03/16/20 Status is pending  Oral Oncology Clinic will continue to follow.  [facility] Patient [place] Phone [phone] Fax [phone] 03/17/2020 11:07 AM

## 2020-03-24 ENCOUNTER — Encounter: Payer: Self-pay | Admitting: Oncology

## 2020-03-24 ENCOUNTER — Inpatient Hospital Stay (HOSPITAL_BASED_OUTPATIENT_CLINIC_OR_DEPARTMENT_OTHER): Payer: Medicare Other | Admitting: Oncology

## 2020-03-24 ENCOUNTER — Inpatient Hospital Stay: Payer: Medicare Other | Attending: Oncology

## 2020-03-24 VITALS — BP 157/79 | HR 61 | Temp 97.2°F | Resp 18 | Wt 96.1 lb

## 2020-03-24 DIAGNOSIS — M4856XA Collapsed vertebra, not elsewhere classified, lumbar region, initial encounter for fracture: Secondary | ICD-10-CM | POA: Insufficient documentation

## 2020-03-24 DIAGNOSIS — I700 Atherosclerosis of aorta: Secondary | ICD-10-CM | POA: Diagnosis not present

## 2020-03-24 DIAGNOSIS — Z801 Family history of malignant neoplasm of trachea, bronchus and lung: Secondary | ICD-10-CM | POA: Diagnosis not present

## 2020-03-24 DIAGNOSIS — K449 Diaphragmatic hernia without obstruction or gangrene: Secondary | ICD-10-CM | POA: Diagnosis not present

## 2020-03-24 DIAGNOSIS — Z803 Family history of malignant neoplasm of breast: Secondary | ICD-10-CM | POA: Diagnosis not present

## 2020-03-24 DIAGNOSIS — D72829 Elevated white blood cell count, unspecified: Secondary | ICD-10-CM | POA: Diagnosis not present

## 2020-03-24 DIAGNOSIS — Z5111 Encounter for antineoplastic chemotherapy: Secondary | ICD-10-CM

## 2020-03-24 DIAGNOSIS — Z807 Family history of other malignant neoplasms of lymphoid, hematopoietic and related tissues: Secondary | ICD-10-CM | POA: Insufficient documentation

## 2020-03-24 DIAGNOSIS — E279 Disorder of adrenal gland, unspecified: Secondary | ICD-10-CM | POA: Insufficient documentation

## 2020-03-24 DIAGNOSIS — I2510 Atherosclerotic heart disease of native coronary artery without angina pectoris: Secondary | ICD-10-CM | POA: Diagnosis not present

## 2020-03-24 DIAGNOSIS — Z87891 Personal history of nicotine dependence: Secondary | ICD-10-CM | POA: Diagnosis not present

## 2020-03-24 DIAGNOSIS — R634 Abnormal weight loss: Secondary | ICD-10-CM

## 2020-03-24 DIAGNOSIS — Z90710 Acquired absence of both cervix and uterus: Secondary | ICD-10-CM | POA: Diagnosis not present

## 2020-03-24 DIAGNOSIS — Z1589 Genetic susceptibility to other disease: Secondary | ICD-10-CM

## 2020-03-24 DIAGNOSIS — R161 Splenomegaly, not elsewhere classified: Secondary | ICD-10-CM | POA: Diagnosis not present

## 2020-03-24 DIAGNOSIS — D649 Anemia, unspecified: Secondary | ICD-10-CM | POA: Diagnosis not present

## 2020-03-24 DIAGNOSIS — D7581 Myelofibrosis: Secondary | ICD-10-CM

## 2020-03-24 DIAGNOSIS — Z515 Encounter for palliative care: Secondary | ICD-10-CM | POA: Insufficient documentation

## 2020-03-24 DIAGNOSIS — D471 Chronic myeloproliferative disease: Secondary | ICD-10-CM | POA: Diagnosis not present

## 2020-03-24 DIAGNOSIS — Z7189 Other specified counseling: Secondary | ICD-10-CM | POA: Insufficient documentation

## 2020-03-24 DIAGNOSIS — Z808 Family history of malignant neoplasm of other organs or systems: Secondary | ICD-10-CM | POA: Insufficient documentation

## 2020-03-24 LAB — COMPREHENSIVE METABOLIC PANEL
ALT: 22 U/L (ref 0–44)
AST: 35 U/L (ref 15–41)
Albumin: 4.4 g/dL (ref 3.5–5.0)
Alkaline Phosphatase: 54 U/L (ref 38–126)
Anion gap: 10 (ref 5–15)
BUN: 21 mg/dL (ref 8–23)
CO2: 27 mmol/L (ref 22–32)
Calcium: 9.2 mg/dL (ref 8.9–10.3)
Chloride: 104 mmol/L (ref 98–111)
Creatinine, Ser: 0.7 mg/dL (ref 0.44–1.00)
GFR, Estimated: >60 mL/min (ref >60)
Glucose, Bld: 108 mg/dL — ABNORMAL HIGH (ref 70–99)
Potassium: 4.9 mmol/L (ref 3.5–5.1)
Sodium: 141 mmol/L (ref 135–145)
Total Bilirubin: 0.7 mg/dL (ref 0.3–1.2)
Total Protein: 7.7 g/dL (ref 6.5–8.1)

## 2020-03-24 LAB — CBC WITH DIFFERENTIAL/PLATELET
Abs Immature Granulocytes: 3.7 10*3/uL — ABNORMAL HIGH (ref 0.00–0.07)
Band Neutrophils: 5 %
Basophils Absolute: 0.7 10*3/uL — ABNORMAL HIGH (ref 0.0–0.1)
Basophils Relative: 4 %
Blasts: 1 %
Eosinophils Absolute: 0.5 10*3/uL (ref 0.0–0.5)
Eosinophils Relative: 3 %
HCT: 37.1 % (ref 36.0–46.0)
Hemoglobin: 11.4 g/dL — ABNORMAL LOW (ref 12.0–15.0)
Lymphocytes Relative: 11 %
Lymphs Abs: 1.8 10*3/uL (ref 0.7–4.0)
MCH: 30.2 pg (ref 26.0–34.0)
MCHC: 30.7 g/dL (ref 30.0–36.0)
MCV: 98.1 fL (ref 80.0–100.0)
Metamyelocytes Relative: 7 %
Monocytes Absolute: 0.8 10*3/uL (ref 0.1–1.0)
Monocytes Relative: 5 %
Myelocytes: 13 %
Neutro Abs: 9 10*3/uL — ABNORMAL HIGH (ref 1.7–7.7)
Neutrophils Relative %: 49 %
Platelets: 229 10*3/uL (ref 150–400)
Promyelocytes Relative: 2 %
RBC: 3.78 MIL/uL — ABNORMAL LOW (ref 3.87–5.11)
RDW: 20.4 % — ABNORMAL HIGH (ref 11.5–15.5)
Smear Review: NORMAL
WBC: 16.7 10*3/uL — ABNORMAL HIGH (ref 4.0–10.5)
nRBC: 5.4 % — ABNORMAL HIGH (ref 0.0–0.2)

## 2020-03-24 LAB — LACTATE DEHYDROGENASE: LDH: 863 U/L — ABNORMAL HIGH (ref 98–192)

## 2020-03-24 NOTE — Progress Notes (Signed)
Hematology/Oncology Follow up note [facility] Telephone:([phone] [phone]   Patient Care Team: Tracie Harrier, MD as PCP - General (Internal Medicine)  REFERRING PROVIDER: Tracie Harrier, MD  REASON FOR VISIT Follow up for treatment of myelofibrosis  HISTORY OF PRESENTING ILLNESS:  [name] is a  87 y.o.  female with PMH listed below who was referred to me for evaluation of leukocytosis and splenomegaly. Patient was recently admitted to outside facility [facility] in [facility] on Jun 12, 2017 after she developed rectal bleeding.  CT was done during that visit which showed a splenomegaly, spleen enlarged at 14 cm.  There is an indeterminant 1.5 cm right adrenal mass.  Rectal bleeding was considered to be secondary to diverticulitis. Patient follows up with Dr. Ginette Pitman primary care physician. On being 29 2019.  CBC showed white count 14.3, hemoglobin 9.9, MCV 99.4, immature granulocytes 1.2, reticulocyte percentage 7.78 elevated, platelet count 386,000, differential showed neutrophilia, ANC 9.93, basophils 0.47,  Reviewed patient's previous lab work-up.  She has had leukocytosis since October 2018 with similar differential abnormalities. Patient has also reported easy bruising, no episodes of acute bleeding.  She also had weight loss about 12 pounds for the past 2 to 3 months.  Reports feeling fatigued.   11/06/2017 Bone marrow biopsy aspirate showed hypercellular bone marrow with features of myeloproliferative neoplasm.  Discussed with Dr. Gari Crown All aspirate material is suboptimal but "biopsy touch imprint in the peripheral blood mostly show features of myeloproliferative neoplasm.  Particular the primary myelofibrosis especially in conjunction with patient's previously documented Jak 2 mutation.  No apparent increasing blast cells is seen.  An iron stain performed on touch imprints showed scattered offering centroblast.   Letter finding is of uncertain significance in this setting and may represent a dysplastic component.  Cytogenetics shows normal karyotype.  Foundation heme pending high molecular risk mutation status[ ASXL1, SRSF2, [id number], EZH2, IDH1/2] . GIPSS prognostic model: 1 point or more depending on foundation HEME results.  MIPSS70+ v2.0: She has constitutional symptoms which is 2 points, normal karyotype.  Status of high molecular risk mutational unknown Likely intermediate risk or higher.  # 12/06/2017 started on Jakafi 73m BID.   #Adrenal mass, no need for additional work-up.   12/04/2018 CT abdomen adrenal protocol showed benign bilateral adrenal adenoma.  Mild to moderate splenomegaly.  Chronic hiatal hernia and chronic compression fracture of L1. Coronary atherosclerosis.  Aortic atherosclerosis. Images were independently viewed by me and discussed with patient.   INTERVAL HISTORY Morgan MOISEis a 87y.o. female who has above history reviewed by me today presents for follow up for primary myelofibrosis  Patient was accompanied by her daughter today. Patient continues to have good appetite.  No night sweats. She has gained weight, about 6 pounds since last visit. Reports some bilateral lower extremity cramps intermittently.  No fever, chills, nausea vomiting diarrhea.  Review of Systems  Constitutional: Negative for appetite change, chills, fatigue, fever and unexpected weight change.  HENT:   Negative for hearing loss and voice change.   Eyes: Negative for eye problems.  Respiratory: Negative for chest tightness and cough.   Cardiovascular: Negative for chest pain and leg swelling.  Gastrointestinal: Negative for abdominal distention, abdominal pain and blood in stool.  Endocrine: Negative for hot flashes.  Genitourinary: Negative for difficulty urinating, dysuria and frequency.   Musculoskeletal: Negative for arthralgias.  Skin: Negative for itching and rash.   Neurological: Negative for extremity weakness.  Hematological: Negative for adenopathy.  Psychiatric/Behavioral: Negative for confusion.    MEDICAL HISTORY:  Past Medical History:  Diagnosis Date  . Aneurysm of anterior cerebral artery   . Blindness of left eye    secondary to cataract surgery complication  . Depression   . Hypertension   . Myelofibrosis ([place])   . SAH (subarachnoid hemorrhage) ([place]) 02/2006  . Tobacco abuse     SURGICAL HISTORY: Past Surgical History:  Procedure Laterality Date  . ABDOMINAL HYSTERECTOMY    . ANEURYSM COILING    . CATARACT EXTRACTION    . CHOLECYSTECTOMY      SOCIAL HISTORY: Social History   Socioeconomic History  . Marital status: Widowed    Spouse name: Not on file  . Number of children: 1  . Years of education: Not on file  . Highest education level: Not on file  Occupational History  . Not on file  Tobacco Use  . Smoking status: Former Smoker    Quit date: 07/31/2015    Years since quitting: 4.6  . Smokeless tobacco: Never Used  Vaping Use  . Vaping Use: Never used  Substance and Sexual Activity  . Alcohol use: Yes    Alcohol/week: 1.0 standard drink    Types: 1 Glasses of wine per week    Comment: once a month  . Drug use: No  . Sexual activity: Not on file  Other Topics Concern  . Not on file  Social History Narrative  . Not on file   Social Determinants of Health   Financial Resource Strain: Not on file  Food Insecurity: Not on file  Transportation Needs: Not on file  Physical Activity: Not on file  Stress: Not on file  Social Connections: Not on file  Intimate Partner Violence: Not on file    FAMILY HISTORY: Family History  Problem Relation Age of Onset  . Breast cancer Daughter 23  . Breast cancer Sister   . Throat cancer Mother   . Lung cancer Father   . Prostate cancer Brother   . Leukemia Brother   . Prostate cancer Brother   . Leukemia Brother     ALLERGIES:  has No Known  Allergies.  MEDICATIONS:  Current Outpatient Medications  Medication Sig Dispense Refill  . amLODipine (NORVASC) 5 MG tablet Take 5 mg by mouth daily.    Marland Kitchen aspirin 81 MG tablet Take 81 mg by mouth daily.    . citalopram (CELEXA) 20 MG tablet Take 20 mg by mouth daily.    . diphenhydramine-acetaminophen (TYLENOL PM) 25-500 MG TABS tablet Take 1 tablet by mouth at bedtime as needed. Pt splits medication into 3    . ferrous sulfate 325 (65 FE) MG tablet Take 325 mg by mouth once a week. Take 1 tablet on Sundays.    Marland Kitchen lisinopril (PRINIVIL,ZESTRIL) 40 MG tablet Take 40 mg by mouth daily.    Marland Kitchen omeprazole (PRILOSEC) 20 MG capsule Take 20 mg by mouth every morning.    . ruxolitinib phosphate (JAKAFI) 15 MG tablet Take 1 tablet (15 mg total) by mouth 2 (two) times daily. 60 tablet 3  . Vitamin D, Ergocalciferol, (DRISDOL) 50000 units CAPS capsule Take 50,000 Units by mouth every Sunday.     No current facility-administered medications for this visit.     PHYSICAL EXAMINATION: ECOG PERFORMANCE STATUS: 1 - Symptomatic but completely ambulatory Vitals:   03/24/20 1019  BP: (!) 157/79  Pulse: 61  Resp: 18  Temp: (!) 97.2 F (36.2 C)  Filed Weights   03/24/20 1019  Weight: 96 lb 1.6 oz (43.6 kg)    Physical Exam Constitutional:      General: She is not in acute distress.    Comments: Thin, walk independently.  HENT:     Head: Normocephalic and atraumatic.  Eyes:     General: No scleral icterus.    Conjunctiva/sclera: Conjunctivae normal.     Pupils: Pupils are equal, round, and reactive to light.  Cardiovascular:     Rate and Rhythm: Normal rate and regular rhythm.     Heart sounds: Normal heart sounds.  Pulmonary:     Effort: Pulmonary effort is normal. No respiratory distress.     Breath sounds: Normal breath sounds. No wheezing or rales.  Chest:     Chest wall: No tenderness.  Abdominal:     General: Bowel sounds are normal. There is no distension.     Palpations: Abdomen  is soft. There is no mass.     Tenderness: There is no abdominal tenderness.     Comments: No splenomegaly.   Musculoskeletal:        General: No deformity. Normal range of motion.     Cervical back: Normal range of motion and neck supple.  Lymphadenopathy:     Cervical: No cervical adenopathy.  Skin:    General: Skin is warm and dry.     Findings: No erythema or rash.  Neurological:     Mental Status: She is alert and oriented to person, place, and time. Mental status is at baseline.     Cranial Nerves: No cranial nerve deficit.     Coordination: Coordination normal.  Psychiatric:        Mood and Affect: Mood normal.        LABORATORY DATA:  I have reviewed the data as listed Lab Results  Component Value Date   WBC 16.7 (H) 03/24/2020   HGB 11.4 (L) 03/24/2020   HCT 37.1 03/24/2020   MCV 98.1 03/24/2020   PLT 229 03/24/2020   Recent Labs    06/04/19 1300 07/02/19 1252 09/12/19 1114 12/12/19 1006 01/10/20 1004 03/24/20 0958  NA 143 144 142 142 141 141  K 4.5 3.4* 3.9 4.6 3.9 4.9  CL 105 107 104 105 103 104  CO2 _0 GLUCOSE 87 154* 89 105* 96 108*  BUN _1 CREATININE 1.02* 0.89 0.70 0.85 0.76 0.70  CALCIUM 9.1 8.8* 8.7* 9.2 8.9 9.2  GFRNONAA 49* 58* >60 >60 >60 >60  GFRAA 57* >60 >60  --   --   --   PROT 7.4 7.1 6.9 7.2 7.3 7.7  ALBUMIN 4.2 4.1 4.2 4.2 4.3 4.4  AST 36 _2 36 35  ALT _3 ALKPHOS 95 60 54 58 43 54  BILITOT 0.8 0.9 0.9 1.1 0.8 0.7    Peripheral blood flow cytometry showed neutrophilia with left shift, less than 1% myeloblasts and basophilia.  CBC and a phenotypic findings suggest a myeloproliferative disorder   # BCR ABL FISH negative. # Jak 2 V617 mutation positive  RADIOGRAPHIC STUDIES: I have personally reviewed the radiological images as listed and agreed with the findings in the report. No results found.   ASSESSMENT & PLAN:  1. Myelofibrosis ([place])   2. JAK-2 gene mutation   3.  Weight loss   4. Encounter for antineoplastic chemotherapy    # JAK 2 V617 mutation  positive, myelofibrosis Clinically patient is doing well. Leukocytosis has slightly improved. Continue Jakafi 15 mg twice daily. Mild anemia, hemoglobin 11.4, stable.  Weight loss, she has gained weight. We spent sufficient time to discuss many aspect of care, questions were answered to patient and daughters satisfaction. Follow-up in 8 weeks  Orders Placed This Encounter  Procedures  . CBC with Differential/Platelet    Standing Status:   Future    Standing Expiration Date:   03/24/2021  . Comprehensive metabolic panel    Standing Status:   Future    Standing Expiration Date:   03/24/2021  . Lactate dehydrogenase    Standing Status:   Future    Standing Expiration Date:   03/24/2021    Earlie Server, MD, PhD Hematology Oncology [place] [facility] 03/24/2020

## 2020-03-24 NOTE — Progress Notes (Signed)
Patient here for follow up. Pt reports she is having cramps to legs especially at night.

## 2020-03-25 ENCOUNTER — Telehealth: Payer: Self-pay

## 2020-03-25 NOTE — Telephone Encounter (Signed)
Debbie informed.  Please schedule as MD recommends and call Debbie with appt details.

## 2020-03-25 NOTE — Telephone Encounter (Signed)
-----   Message from Earlie Server, MD sent at 03/24/2020  8:49 PM EST ----- Her labs were pending during her visits.please let daughter know that her counts are better. Continue same Jakafi regimen and since her counts are bettering, please change her follow up from 6 weeks to 8 weeks. Thanks.

## 2020-03-25 NOTE — Telephone Encounter (Signed)
Done... Appts were R/S from 6wks to 8 wks per MD Pt daughter [name] was made aware. A NEW appt reminder letter will be mail out also

## 2020-04-28 ENCOUNTER — Encounter: Payer: Self-pay | Admitting: Emergency Medicine

## 2020-04-28 ENCOUNTER — Emergency Department: Payer: Medicare Other

## 2020-04-28 ENCOUNTER — Observation Stay
Admission: EM | Admit: 2020-04-28 | Discharge: 2020-04-30 | Disposition: A | Discharge status: Home / Self Care | Payer: Medicare Other | Attending: Internal Medicine | Admitting: Internal Medicine

## 2020-04-28 ENCOUNTER — Other Ambulatory Visit: Payer: Self-pay

## 2020-04-28 DIAGNOSIS — J449 Chronic obstructive pulmonary disease, unspecified: Secondary | ICD-10-CM | POA: Insufficient documentation

## 2020-04-28 DIAGNOSIS — R0902 Hypoxemia: Secondary | ICD-10-CM | POA: Diagnosis not present

## 2020-04-28 DIAGNOSIS — S065X0A Traumatic subdural hemorrhage without loss of consciousness, initial encounter: Secondary | ICD-10-CM | POA: Diagnosis not present

## 2020-04-28 DIAGNOSIS — M79604 Pain in right leg: Secondary | ICD-10-CM | POA: Diagnosis not present

## 2020-04-28 DIAGNOSIS — Z79899 Other long term (current) drug therapy: Secondary | ICD-10-CM | POA: Diagnosis not present

## 2020-04-28 DIAGNOSIS — I619 Nontraumatic intracerebral hemorrhage, unspecified: Secondary | ICD-10-CM | POA: Diagnosis present

## 2020-04-28 DIAGNOSIS — S0512XA Contusion of eyeball and orbital tissues, left eye, initial encounter: Secondary | ICD-10-CM

## 2020-04-28 DIAGNOSIS — R778 Other specified abnormalities of plasma proteins: Secondary | ICD-10-CM | POA: Insufficient documentation

## 2020-04-28 DIAGNOSIS — S065X9A Traumatic subdural hemorrhage with loss of consciousness of unspecified duration, initial encounter: Secondary | ICD-10-CM

## 2020-04-28 DIAGNOSIS — Z87891 Personal history of nicotine dependence: Secondary | ICD-10-CM | POA: Diagnosis not present

## 2020-04-28 DIAGNOSIS — Z20822 Contact with and (suspected) exposure to covid-19: Secondary | ICD-10-CM | POA: Insufficient documentation

## 2020-04-28 DIAGNOSIS — W19XXXA Unspecified fall, initial encounter: Secondary | ICD-10-CM | POA: Insufficient documentation

## 2020-04-28 DIAGNOSIS — I10 Essential (primary) hypertension: Secondary | ICD-10-CM | POA: Insufficient documentation

## 2020-04-28 DIAGNOSIS — S06360A Traumatic hemorrhage of cerebrum, unspecified, without loss of consciousness, initial encounter: Secondary | ICD-10-CM | POA: Insufficient documentation

## 2020-04-28 DIAGNOSIS — M79601 Pain in right arm: Secondary | ICD-10-CM | POA: Insufficient documentation

## 2020-04-28 DIAGNOSIS — S065XAA Traumatic subdural hemorrhage with loss of consciousness status unknown, initial encounter: Secondary | ICD-10-CM

## 2020-04-28 LAB — COMPREHENSIVE METABOLIC PANEL
ALT: 18 U/L (ref 0–44)
AST: 33 U/L (ref 15–41)
Albumin: 4.1 g/dL (ref 3.5–5.0)
Alkaline Phosphatase: 59 U/L (ref 38–126)
Anion gap: 9 (ref 5–15)
BUN: 17 mg/dL (ref 8–23)
CO2: 25 mmol/L (ref 22–32)
Calcium: 9.1 mg/dL (ref 8.9–10.3)
Chloride: 104 mmol/L (ref 98–111)
Creatinine, Ser: 0.73 mg/dL (ref 0.44–1.00)
GFR, Estimated: >60 mL/min (ref >60)
Glucose, Bld: 108 mg/dL — ABNORMAL HIGH (ref 70–99)
Potassium: 4.1 mmol/L (ref 3.5–5.1)
Sodium: 138 mmol/L (ref 135–145)
Total Bilirubin: 1.1 mg/dL (ref 0.3–1.2)
Total Protein: 7.1 g/dL (ref 6.5–8.1)

## 2020-04-28 LAB — CBC WITH DIFFERENTIAL/PLATELET
Abs Immature Granulocytes: 3.31 10*3/uL — ABNORMAL HIGH (ref 0.00–0.07)
Basophils Absolute: 0.7 10*3/uL — ABNORMAL HIGH (ref 0.0–0.1)
Basophils Relative: 4 %
Eosinophils Absolute: 0.1 10*3/uL (ref 0.0–0.5)
Eosinophils Relative: 0 %
HCT: 34.5 % — ABNORMAL LOW (ref 36.0–46.0)
Hemoglobin: 10.5 g/dL — ABNORMAL LOW (ref 12.0–15.0)
Immature Granulocytes: 20 %
Lymphocytes Relative: 4 %
Lymphs Abs: 0.6 10*3/uL — ABNORMAL LOW (ref 0.7–4.0)
MCH: 29.8 pg (ref 26.0–34.0)
MCHC: 30.4 g/dL (ref 30.0–36.0)
MCV: 98 fL (ref 80.0–100.0)
Monocytes Absolute: 1 10*3/uL (ref 0.1–1.0)
Monocytes Relative: 6 %
Neutro Abs: 10.9 10*3/uL — ABNORMAL HIGH (ref 1.7–7.7)
Neutrophils Relative %: 66 %
Platelets: 214 10*3/uL (ref 150–400)
RBC: 3.52 MIL/uL — ABNORMAL LOW (ref 3.87–5.11)
RDW: 20.9 % — ABNORMAL HIGH (ref 11.5–15.5)
WBC: 16.6 10*3/uL — ABNORMAL HIGH (ref 4.0–10.5)
nRBC: 8.9 % — ABNORMAL HIGH (ref 0.0–0.2)

## 2020-04-28 LAB — TROPONIN I (HIGH SENSITIVITY)
Troponin I (High Sensitivity): 45 ng/L — ABNORMAL HIGH (ref <18)
Troponin I (High Sensitivity): 185 ng/L (ref <18)

## 2020-04-28 LAB — PROTIME-INR
INR: 1 (ref 0.8–1.2)
Prothrombin Time: 13.2 seconds (ref 11.4–15.2)

## 2020-04-28 LAB — APTT: aPTT: 32 seconds (ref 24–36)

## 2020-04-28 NOTE — ED Notes (Signed)
Critical result  Troponin 185  Archie Balboa, MD notified
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
## 2020-04-28 NOTE — ED Notes (Signed)
Pt hooked up to cardiac monitor, call bell within reach, stretcher locked in lowest position. Family at bedside. Clear speech

## 2020-04-28 NOTE — ED Provider Notes (Signed)
[facility] Emergency Department Provider Note  ____________________________________________   Event Date/Time   First MD Initiated Contact with Patient 04/28/20 1622     (approximate)  I have reviewed the triage vital signs and the nursing notes.   HISTORY  Level 5 caveat: History is limited secondary to baseline dementia for the patient as well as the patient's current condition  Chief Complaint Fall  HPI [name] is a 87 y.o. female reports to the emergency department for evaluation of fall on the right side.  Patient is accompanied by daughter.  Patient reports that she was attempting to get out of the passenger side of her car, and somehow fell on the right side of her body.  The daughter was getting out of the drivers side and did not witness the fall.  A bystander witnessed it and felt that she got "tangled on her feet".  The patient does not herself remember what caused her to fall.  She does not recall feeling dizzy, faint, lightheaded or any other particular symptoms prior to the fall.  She fell striking the right side of her face and body on the pavement.  She denies loss of consciousness, nausea or vomiting.  Of note, patient does have relevant history of aneurysm with hemorrhage approximately 10 years ago, made a recovery from this.  She is complaining of right-sided facial pain, right arm pain at site of wound as well as right leg pain at site of wound.  She denies any headache other than at the site of her facial injuries, denies dizziness, nausea or vomiting.  She has been able to move her extremities since time of injury, denies any hip pain.         Past Medical History:  Diagnosis Date  . Aneurysm of anterior cerebral artery   . Blindness of left eye    secondary to cataract surgery complication  . Depression   . Hypertension   . Myelofibrosis ([place])   . SAH (subarachnoid hemorrhage) ([place]) 02/2006  . Tobacco abuse     Patient  Active Problem List   Diagnosis Date Noted  . Goals of care, counseling/discussion 03/24/2020  . Weight loss 03/24/2020  . Myelofibrosis ([place]) 02/25/2018  . JAK-2 gene mutation 02/25/2018  . Uncontrolled hypertension 02/25/2018  . Encounter for antineoplastic chemotherapy 12/27/2017  . Acute on chronic respiratory failure with hypoxia ([place]) 07/11/2016  . COPD exacerbation ([place]) 07/11/2016    Past Surgical History:  Procedure Laterality Date  . ABDOMINAL HYSTERECTOMY    . ANEURYSM COILING    . CATARACT EXTRACTION    . CHOLECYSTECTOMY      Prior to Admission medications   Medication Sig Start Date End Date Taking? Authorizing Provider  amLODipine (NORVASC) 5 MG tablet Take 5 mg by mouth daily. 12/29/17 03/24/20  [provider]  aspirin 81 MG tablet Take 81 mg by mouth daily.    [provider]  citalopram (CELEXA) 20 MG tablet Take 20 mg by mouth daily.    [provider]  diphenhydramine-acetaminophen (TYLENOL PM) 25-500 MG TABS tablet Take 1 tablet by mouth at bedtime as needed. Pt splits medication into 3    [provider]  ferrous sulfate 325 (65 FE) MG tablet Take 325 mg by mouth once a week. Take 1 tablet on Sundays.    [provider]  lisinopril (PRINIVIL,ZESTRIL) 40 MG tablet Take 40 mg by mouth daily. 06/30/16   [provider]  omeprazole (PRILOSEC) 20 MG  capsule Take 20 mg by mouth every morning.    [provider]  ruxolitinib phosphate (JAKAFI) 15 MG tablet Take 1 tablet (15 mg total) by mouth 2 (two) times daily. 12/12/19   Earlie Server, MD  Vitamin D, Ergocalciferol, (DRISDOL) 50000 units CAPS capsule Take 50,000 Units by mouth every Sunday.    [provider]    Allergies Patient has no known allergies.  Family History  Problem Relation Age of Onset  . Breast cancer Daughter 32  . Breast cancer Sister   . Throat cancer Mother   . Lung cancer Father   . Prostate cancer Brother   . Leukemia  Brother   . Prostate cancer Brother   . Leukemia Brother     Social History Social History   Tobacco Use  . Smoking status: Former Smoker    Quit date: 07/31/2015    Years since quitting: 4.7  . Smokeless tobacco: Never Used  Vaping Use  . Vaping Use: Never used  Substance Use Topics  . Alcohol use: Yes    Alcohol/week: 1.0 standard drink    Types: 1 Glasses of wine per week    Comment: once a month  . Drug use: No    Review of Systems Constitutional: No fever/chills Eyes: + Known prior left eye blindness secondary to complication from cataract surgery, no acute visual changes; + wound to right eyebrow and lateral aspect of eye ENT: No sore throat. Cardiovascular: Denies chest pain. Respiratory: Denies shortness of breath. Gastrointestinal: No abdominal pain.  No nausea, no vomiting.  No diarrhea.  No constipation. Genitourinary: Negative for dysuria. Musculoskeletal: + Right elbow pain, right leg pain, negative for back pain. Skin: Negative for rash. Neurological: Negative for headaches, focal weakness or numbness.   ____________________________________________   PHYSICAL EXAM:  VITAL SIGNS: ED Triage Vitals  Enc Vitals Group     BP 04/28/20 1448 (!) 188/76     Pulse Rate 04/28/20 1448 76     Resp 04/28/20 1448 18     Temp 04/28/20 1448 98.2 F (36.8 C)     Temp Source 04/28/20 1751 Oral     SpO2 04/28/20 1448 95 %     Weight 04/28/20 1449 97 lb (44 kg)     Height 04/28/20 1449 5\' 1"  (1.549 m)     Head Circumference --      Peak Flow --      Pain Score 04/28/20 1449 4     Pain Loc --      Pain Edu? --      Excl. in [place]? --    Constitutional: Alert and oriented x 3. Well appearing and in no acute distress. Eyes: Conjunctivae are normal. PERRL. EOMI. patient has known blindness in the left eye.  There is traumatic wound of the right eyebrow, with mild associated ecchymosis over the lateral aspect of the orbit.  No significant periorbital swelling. Head:  Facial trauma as described above. Nose: No clear drainage. Mouth/Throat: Mucous membranes are moist.  Oropharynx non-erythematous. Neck: No stridor.  No tenderness to midline or paraspinals of the cervical spine. Cardiovascular: No chest wall ecchymosis.  No tenderness to palpation of the chest wall.  Normal rate, regular rhythm. Grossly normal heart sounds.   Respiratory: Normal respiratory effort.  No retractions. Lungs CTAB. Gastrointestinal: No abdominal ecchymosis, soft and nontender. No distention. No abdominal bruits. No CVA tenderness. Musculoskeletal: There is tenderness noted over the site of a right arm laceration as well as right leg laceration.  The right shoulder, elbow and wrist move freely, with decreased range of motion secondary to pain.  Radial pulse 2+ bilaterally.  Negative logroll of the bilateral hips, negative compression test of the bilateral hips. Neurologic: Clear speech.  Neurologic exam is difficult to assess secondary to patient's dementia as well as previous deficits.  Patient does raise eyebrows symmetric bilaterally, smiles, EOMs are intact.  Patient has difficulty understanding instructions for puffing of cheeks, squinting the eyes, movement of the tongue. Skin:  Skin is warm, dry and intact except as described above. No rash noted. Psychiatric: Mood and affect are normal. Speech and behavior are normal.  ____________________________________________   LABS (all labs ordered are listed, but only abnormal results are displayed)  Labs Reviewed  COMPREHENSIVE METABOLIC PANEL - Abnormal; Notable for the following components:      Result Value   Glucose, Bld 108 (*)    All other components within normal limits  CBC WITH DIFFERENTIAL/PLATELET - Abnormal; Notable for the following components:   WBC 16.6 (*)    RBC 3.52 (*)    Hemoglobin 10.5 (*)    HCT 34.5 (*)    RDW 20.9 (*)    nRBC 8.9 (*)    Neutro Abs 10.9 (*)    Lymphs Abs 0.6 (*)    Basophils Absolute  0.7 (*)    Abs Immature Granulocytes 3.31 (*)    All other components within normal limits  TROPONIN I (HIGH SENSITIVITY) - Abnormal; Notable for the following components:   Troponin I (High Sensitivity) 45 (*)    All other components within normal limits  APTT  PROTIME-INR  PATHOLOGIST SMEAR REVIEW  TROPONIN I (HIGH SENSITIVITY)   ____________________________________________  EKG  Normal sinus rhythm with rate of 70 bpm.  There is evidence of right bundle branch block, no specific ST segment elevations or depressions.  No evidence of acute ischemia. ____________________________________________  RADIOLOGY  Official radiology report(s): DG Chest 1 View  Result Date: 04/28/2020 CLINICAL DATA:  Fall, chest pain EXAM: CHEST  1 VIEW COMPARISON:  07/11/2016 FINDINGS: There is hyperinflation of the lungs compatible with COPD. Cardiomegaly. Aortic atherosclerosis. Patchy opacities noted in the left mid lung and right lung base concerning for pneumonia. No effusions or pneumothorax. No acute bony abnormality. IMPRESSION: Cardiomegaly, COPD. Patchy bilateral opacities concerning for pneumonia. Electronically Signed   By: Rolm Baptise M.D.   On: 04/28/2020 18:52   DG Forearm Right  Result Date: 04/28/2020 CLINICAL DATA:  Right-sided pain after a fall EXAM: RIGHT FOREARM - 2 VIEW COMPARISON:  None. FINDINGS: There is no evidence of fracture or other focal bone lesions. A tiny calcifications are seen along the lateral epicondyle and lateral joint space. There is diffuse osteopenia. There is also calcifications along the TFCC. IMPRESSION: No acute osseous abnormality. Electronically Signed   By: Prudencio Pair M.D.   On: 04/28/2020 17:09   DG Tibia/Fibula Right  Result Date: 04/28/2020 CLINICAL DATA:  Fall EXAM: RIGHT TIBIA AND FIBULA - 2 VIEW COMPARISON:  None. FINDINGS: There is no evidence of fracture or other focal bone lesions. Vascular calcification. IMPRESSION: No fracture or dislocation.  Electronically Signed   By: Macy Mis M.D.   On: 04/28/2020 17:11   CT Head Wo Contrast  Result Date: 04/28/2020 CLINICAL DATA:  Status post fall from car. Landed on right side. Laceration to right side. EXAM: CT HEAD WITHOUT CONTRAST CT MAXILLOFACIAL WITHOUT CONTRAST CT CERVICAL SPINE WITHOUT CONTRAST TECHNIQUE: Multidetector CT imaging of the head, cervical spine, and  maxillofacial structures were performed using the standard protocol without intravenous contrast. Multiplanar CT image reconstructions of the cervical spine and maxillofacial structures were also generated. COMPARISON:  MR head 06/25/2015, CT head 03/20/2006 FINDINGS: CT HEAD FINDINGS Brain: Patchy and confluent areas of decreased attenuation are noted throughout the deep and periventricular white matter of the cerebral hemispheres bilaterally, compatible with chronic microvascular ischemic disease. Chronic left frontal lobe infarction. Chronic left occipital lobe infarction. No evidence of large-territorial acute infarction. Hyperdense foci within the left centrum semiovale that may represent an acute intraparenchymal hemorrhage. No mass lesion. There is hyperdense extra-axial density along the left calvarial convexity measuring up to 7 mm (4:32-33). No mass effect or midline shift. No hydrocephalus. Basilar cisterns are patent. Vascular: Prior aneurysmal clip noted along the level of the cavernous sinus noted. No hyperdense vessel. Atherosclerotic calcifications are present within the cavernous internal carotid and vertebral arteries. Skull: No acute fracture or focal lesion. Other: 6 mm right frontal scalp hematoma. CT MAXILLOFACIAL FINDINGS Osseous: No fracture or mandibular dislocation. No destructive process. Sinuses/Orbits: Paranasal sinuses and mastoid air cells are clear. Bilateral lens replacement. Otherwise the orbits are unremarkable. Soft tissues: Negative. CT CERVICAL SPINE FINDINGS Alignment: Grade 1 anterolisthesis of C3 on  C4. Skull base and vertebrae: Multilevel degenerative changes of the spine. No acute fracture. No aggressive appearing focal osseous lesion or focal pathologic process. Soft tissues and spinal canal: No prevertebral fluid or swelling. No visible canal hematoma. Upper chest: Biapical pleural/pulmonary scarring. Emphysematous changes. Other: Atherosclerotic plaque of the aorta and its major branches. IMPRESSION: 1. Small acute hematoma along the left calvarial convexity measuring up to 7 mm and likely representing a subdural hemorrhage. No associated midline shift. 2. Hyperdense foci within the left centrum semiovale that may represent an acute intraparenchymal hemorrhage. 3. Recommend repeat CT head in 4-6 hours for evaluation of stability. 4. 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 An acute component cannot be excluded in the left frontal lobe. If high clinical suspicion for possible infarction, consider MRI brain noncontrast for further evaluation. 5. No acute displaced facial fracture. 6. No acute displaced fracture or traumatic listhesis of the cervical spine. 7. Aortic Atherosclerosis (ICD10-I70.0) and Emphysema (ICD10-J43.9). These results were called by telephone at the time of interpretation on 04/28/20 at 5:06pmto provider PA Verne Carrow, who verbally acknowledged these results. Electronically Signed   By: Iven Finn M.D.   On: 04/28/2020 17:17   CT Cervical Spine Wo Contrast  Result Date: 04/28/2020 CLINICAL DATA:  Status post fall from car. Landed on right side. Laceration to right side. EXAM: CT HEAD WITHOUT CONTRAST CT MAXILLOFACIAL WITHOUT CONTRAST CT CERVICAL SPINE WITHOUT CONTRAST TECHNIQUE: Multidetector CT imaging of the head, cervical spine, and maxillofacial structures were performed using the standard protocol without intravenous contrast. Multiplanar CT image reconstructions of the cervical spine and maxillofacial structures were also generated. COMPARISON:  MR  head 06/25/2015, CT head 03/20/2006 FINDINGS: CT HEAD FINDINGS Brain: Patchy and confluent areas of decreased attenuation are noted throughout the deep and periventricular white matter of the cerebral hemispheres bilaterally, compatible with chronic microvascular ischemic disease. Chronic left frontal lobe infarction. Chronic left occipital lobe infarction. No evidence of large-territorial acute infarction. Hyperdense foci within the left centrum semiovale that may represent an acute intraparenchymal hemorrhage. No mass lesion. There is hyperdense extra-axial density along the left calvarial convexity measuring up to 7 mm (4:32-33). No mass effect or midline shift. No hydrocephalus. Basilar cisterns are patent. Vascular: Prior aneurysmal clip noted along  the level of the cavernous sinus noted. No hyperdense vessel. Atherosclerotic calcifications are present within the cavernous internal carotid and vertebral arteries. Skull: No acute fracture or focal lesion. Other: 6 mm right frontal scalp hematoma. CT MAXILLOFACIAL FINDINGS Osseous: No fracture or mandibular dislocation. No destructive process. Sinuses/Orbits: Paranasal sinuses and mastoid air cells are clear. Bilateral lens replacement. Otherwise the orbits are unremarkable. Soft tissues: Negative. CT CERVICAL SPINE FINDINGS Alignment: Grade 1 anterolisthesis of C3 on C4. Skull base and vertebrae: Multilevel degenerative changes of the spine. No acute fracture. No aggressive appearing focal osseous lesion or focal pathologic process. Soft tissues and spinal canal: No prevertebral fluid or swelling. No visible canal hematoma. Upper chest: Biapical pleural/pulmonary scarring. Emphysematous changes. Other: Atherosclerotic plaque of the aorta and its major branches. IMPRESSION: 1. Small acute hematoma along the left calvarial convexity measuring up to 7 mm and likely representing a subdural hemorrhage. No associated midline shift. 2. Hyperdense foci within the left  centrum semiovale that may represent an acute intraparenchymal hemorrhage. 3. Recommend repeat CT head in 4-6 hours for evaluation of stability. 4. 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 An acute component cannot be excluded in the left frontal lobe. If high clinical suspicion for possible infarction, consider MRI brain noncontrast for further evaluation. 5. No acute displaced facial fracture. 6. No acute displaced fracture or traumatic listhesis of the cervical spine. 7. Aortic Atherosclerosis (ICD10-I70.0) and Emphysema (ICD10-J43.9). These results were called by telephone at the time of interpretation on 04/28/20 at 5:06pmto provider PA Verne Carrow, who verbally acknowledged these results. Electronically Signed   By: Iven Finn M.D.   On: 04/28/2020 17:17   DG Humerus Right  Result Date: 04/28/2020 CLINICAL DATA:  Right-sided pain and fall EXAM: RIGHT HUMERUS - 2+ VIEW COMPARISON:  None. FINDINGS: There is no evidence of fracture or other focal bone lesions. Soft tissues are unremarkable. IMPRESSION: Negative. Electronically Signed   By: Prudencio Pair M.D.   On: 04/28/2020 17:08   DG Femur Min 2 Views Right  Result Date: 04/28/2020 CLINICAL DATA:  Right-sided pain after fall EXAM: RIGHT FEMUR 2 VIEWS COMPARISON:  None. FINDINGS: There is no evidence of fracture or other focal bone lesions. Moderate superior joint space loss and marginal osteophyte formation is seen at the femoroacetabular joint. There is also calcifications seen at the medial and lateral knee. Scattered vascular calcifications are noted. There is diffuse osteopenia. IMPRESSION: No acute osseous abnormality Electronically Signed   By: Prudencio Pair M.D.   On: 04/28/2020 17:10   CT Maxillofacial Wo Contrast  Result Date: 04/28/2020 CLINICAL DATA:  Status post fall from car. Landed on right side. Laceration to right side. EXAM: CT HEAD WITHOUT CONTRAST CT MAXILLOFACIAL WITHOUT CONTRAST CT CERVICAL SPINE WITHOUT  CONTRAST TECHNIQUE: Multidetector CT imaging of the head, cervical spine, and maxillofacial structures were performed using the standard protocol without intravenous contrast. Multiplanar CT image reconstructions of the cervical spine and maxillofacial structures were also generated. COMPARISON:  MR head 06/25/2015, CT head 03/20/2006 FINDINGS: CT HEAD FINDINGS Brain: Patchy and confluent areas of decreased attenuation are noted throughout the deep and periventricular white matter of the cerebral hemispheres bilaterally, compatible with chronic microvascular ischemic disease. Chronic left frontal lobe infarction. Chronic left occipital lobe infarction. No evidence of large-territorial acute infarction. Hyperdense foci within the left centrum semiovale that may represent an acute intraparenchymal hemorrhage. No mass lesion. There is hyperdense extra-axial density along the left calvarial convexity measuring up to 7 mm (4:32-33). No  mass effect or midline shift. No hydrocephalus. Basilar cisterns are patent. Vascular: Prior aneurysmal clip noted along the level of the cavernous sinus noted. No hyperdense vessel. Atherosclerotic calcifications are present within the cavernous internal carotid and vertebral arteries. Skull: No acute fracture or focal lesion. Other: 6 mm right frontal scalp hematoma. CT MAXILLOFACIAL FINDINGS Osseous: No fracture or mandibular dislocation. No destructive process. Sinuses/Orbits: Paranasal sinuses and mastoid air cells are clear. Bilateral lens replacement. Otherwise the orbits are unremarkable. Soft tissues: Negative. CT CERVICAL SPINE FINDINGS Alignment: Grade 1 anterolisthesis of C3 on C4. Skull base and vertebrae: Multilevel degenerative changes of the spine. No acute fracture. No aggressive appearing focal osseous lesion or focal pathologic process. Soft tissues and spinal canal: No prevertebral fluid or swelling. No visible canal hematoma. Upper chest: Biapical pleural/pulmonary  scarring. Emphysematous changes. Other: Atherosclerotic plaque of the aorta and its major branches. IMPRESSION: 1. Small acute hematoma along the left calvarial convexity measuring up to 7 mm and likely representing a subdural hemorrhage. No associated midline shift. 2. Hyperdense foci within the left centrum semiovale that may represent an acute intraparenchymal hemorrhage. 3. Recommend repeat CT head in 4-6 hours for evaluation of stability. 4. 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 An acute component cannot be excluded in the left frontal lobe. If high clinical suspicion for possible infarction, consider MRI brain noncontrast for further evaluation. 5. No acute displaced facial fracture. 6. No acute displaced fracture or traumatic listhesis of the cervical spine. 7. Aortic Atherosclerosis (ICD10-I70.0) and Emphysema (ICD10-J43.9). These results were called by telephone at the time of interpretation on 04/28/20 at 5:06pmto provider PA Verne Carrow, who verbally acknowledged these results. Electronically Signed   By: Iven Finn M.D.   On: 04/28/2020 17:17    ____________________________________________   INITIAL IMPRESSION / ASSESSMENT AND PLAN / ED COURSE  As part of my medical decision making, I reviewed the following data within the [facility] History obtained from family, Nursing notes reviewed and incorporated, Labs reviewed, Radiograph reviewed, Discussed with radiologist, A consult was requested and obtained from this/these consultant(s) neurosurgery, Evaluated by EM attending Dr. Archie Balboa and Notes from prior ED visits        Patient is an 87 year old female who presents to the emergency department for evaluation after a fall landing on the right side of her body, see HPI for further details.  In triage, patient is noted to have an elevated blood pressure, but otherwise has normal vital signs.  On physical exam, patient does have difficult neurologic  findings given previous deficits as well as age and dementia.  She also has multiple traumatic skin tears/ lacerations.  CT imaging of the head, face and neck were obtained in addition to x-rays of the right arm and leg.  Radiologist called to notify me of acute head bleed present.  Further work-up was obtained, including labs, chest x-ray, EKG.  Patient's case was discussed with Dr. Archie Balboa as well as Dr. Lacinda Axon of neurosurgery.  Patient will be monitored over the next 4 to 6 hours, with repeat CT obtained to evaluate any change in the bleed.  At this time, the patient will be moved to the main side of the emergency department for further monitoring and evaluation given CT findings.  Patient care will be taken over by Dr. Archie Balboa.  Clinical Course as of 04/28/20 2013  Tue Apr 28, 2020  [place], Dr. Mckinley Jewel called to speak with me, notified patient has acute blood  left sided, left 64mm area in calvarial, frontoparietal lobe likely representing subdural hemorrhage; additionally, foci of hyperdensity, can't exclue acute parenchymal hemorrhage; chronic infarction, cannot exclude acute infarction; Recommendation for repeat head CT in 4-6 hours, as well as MRI w/o contrast if concern for acute infarction [CR]  1758 Discussed with Dr. Lacinda Axon, on call neurosurgery; he recommends repeat CT in 4-6 hours unless significant neuro changes and then may consider MR at that time; Hold 81 mg aspirin for approximately 1 week; no indication at this time for platelets [CR]    Clinical Course User Index [CR] Marlana Salvage, PA     ____________________________________________   FINAL CLINICAL IMPRESSION(S) / ED DIAGNOSES  Final diagnoses:  Brain bleed ([place])  Right arm pain  Right leg pain     ED Discharge Orders    None      *Please note:  [name] was evaluated in Emergency Department on 04/28/2020 for the symptoms described in the history of present illness. She was  evaluated in the context of the global COVID-19 pandemic, which necessitated consideration that the patient might be at risk for infection with the SARS-CoV-2 virus that causes COVID-19. Institutional protocols and algorithms that pertain to the evaluation of patients at risk for COVID-19 are in a state of rapid change based on information released by regulatory bodies including the CDC and federal and state organizations. These policies and algorithms were followed during the patient's care in the ED.  Some ED evaluations and interventions may be delayed as a result of limited staffing during and the pandemic.*   Note:  This document was prepared using Dragon voice recognition software and may include unintentional dictation errors.   Marlana Salvage, PA 04/28/20 2013    Nance Pear, MD 04/28/20 [phone]

## 2020-04-29 ENCOUNTER — Observation Stay
Admit: 2020-04-29 | Discharge: 2020-04-29 | Disposition: A | Discharge status: Home / Self Care | Payer: Medicare Other | Attending: Internal Medicine | Admitting: Internal Medicine

## 2020-04-29 DIAGNOSIS — S0512XA Contusion of eyeball and orbital tissues, left eye, initial encounter: Secondary | ICD-10-CM

## 2020-04-29 DIAGNOSIS — R778 Other specified abnormalities of plasma proteins: Secondary | ICD-10-CM

## 2020-04-29 DIAGNOSIS — S065XAA Traumatic subdural hemorrhage with loss of consciousness status unknown, initial encounter: Secondary | ICD-10-CM

## 2020-04-29 DIAGNOSIS — S065X9A Traumatic subdural hemorrhage with loss of consciousness of unspecified duration, initial encounter: Secondary | ICD-10-CM

## 2020-04-29 DIAGNOSIS — W19XXXA Unspecified fall, initial encounter: Secondary | ICD-10-CM

## 2020-04-29 LAB — SARS CORONAVIRUS 2 (TAT 6-24 HRS): SARS Coronavirus 2: NEGATIVE

## 2020-04-29 LAB — TROPONIN I (HIGH SENSITIVITY)
Troponin I (High Sensitivity): 304 ng/L (ref <18)
Troponin I (High Sensitivity): 271 ng/L (ref <18)

## 2020-04-29 LAB — PATHOLOGIST SMEAR REVIEW

## 2020-04-29 MED ORDER — ACETAMINOPHEN 650 MG RE SUPP: 650.0000 mg | Freq: Four times a day (QID) | RECTAL | Status: DC | PRN

## 2020-04-29 MED ORDER — ONDANSETRON HCL 4 MG/2ML IJ SOLN: 4.0000 mg | Freq: Four times a day (QID) | INTRAMUSCULAR | Status: DC | PRN

## 2020-04-29 MED ORDER — ACETAMINOPHEN 325 MG PO TABS: 650.0000 mg | ORAL_TABLET | Freq: Four times a day (QID) | ORAL | Status: DC | PRN

## 2020-04-29 MED ORDER — FERROUS SULFATE 325 (65 FE) MG PO TABS: 325.0000 mg | ORAL_TABLET | ORAL | Status: DC

## 2020-04-29 MED ORDER — HYDRALAZINE HCL 20 MG/ML IJ SOLN: 10.0000 mg | Freq: Four times a day (QID) | INTRAMUSCULAR | Status: DC | PRN

## 2020-04-29 MED ORDER — ONDANSETRON HCL 4 MG PO TABS: 4.0000 mg | ORAL_TABLET | Freq: Four times a day (QID) | ORAL | Status: DC | PRN

## 2020-04-29 MED ORDER — PANTOPRAZOLE SODIUM 40 MG PO TBEC
40.0000 mg | DELAYED_RELEASE_TABLET | Freq: Every day | ORAL | Status: DC
  Administered 2020-04-29 – 2020-04-30 (×2): 40 mg via ORAL
  Filled 2020-04-29 (×2): qty 1

## 2020-04-29 MED ORDER — LISINOPRIL 20 MG PO TABS
40.0000 mg | ORAL_TABLET | Freq: Every day | ORAL | Status: DC
  Administered 2020-04-29 – 2020-04-30 (×2): 40 mg via ORAL
  Filled 2020-04-29 (×2): qty 2

## 2020-04-29 MED ORDER — AMLODIPINE BESYLATE 5 MG PO TABS
5.0000 mg | ORAL_TABLET | Freq: Every day | ORAL | Status: DC
  Administered 2020-04-29 – 2020-04-30 (×2): 5 mg via ORAL
  Filled 2020-04-29 (×2): qty 1

## 2020-04-29 MED ORDER — RUXOLITINIB PHOSPHATE 15 MG PO TABS: 15.0000 mg | ORAL_TABLET | Freq: Two times a day (BID) | ORAL | Status: DC

## 2020-04-29 MED ORDER — HYDROCODONE-ACETAMINOPHEN 5-325 MG PO TABS: 1.0000 | ORAL_TABLET | ORAL | Status: DC | PRN

## 2020-04-29 NOTE — ED Notes (Signed)
Update given on patient to daughter, [name].

## 2020-04-29 NOTE — ED Notes (Signed)
Patient assisted to bathroom. Bed liens wet. Bed changed, chuck pad place. Patient given new gown and brief changed. No needs expressed by patient at this time.

## 2020-04-29 NOTE — ED Notes (Signed)
Patient sleeping at this time. No signs of distress. Respirations even and unlabored. Will continue to monitor.

## 2020-04-29 NOTE — ED Notes (Signed)
Meal tray at bedside.  

## 2020-04-29 NOTE — ED Notes (Addendum)
Troponin is 304, Duncan MD paged to make aware. No new orders at this time.

## 2020-04-29 NOTE — H&P (Signed)
History and Physical    [name] SWN:[id number] DOB: 11/09/1928 DOA: 04/28/2020  PCP: Tracie Harrier, MD   Patient coming from: Home  I have personally briefly reviewed patient's old medical records in [place]  Chief Complaint: Fall  HPI: [name] is a 87 y.o. female with medical history significant for COPD, HTN,  cerebral aneurysm with hemorrhage and depression presenting following an accidental fall as she was trying to get out the car.  Patient fell onto her face on the right side and is uncertain what made her fall.  She was in her usual state of health and denied preceding dizziness, lightheadedness weakness or tingling.  Of no recent illness and denies cough, fever or chills, nausea, vomiting, dysuria or diarrhea or abdominal pain. ED course: On arrival, BP 188/76, pulse 78 respirations 18 with O2 sat 95% on room air and afebrile.  Blood work significant for WBC of 16,000, hemoglobin of 10.  Troponin with significant bump 45>185 EKG as interpreted by me: Normal sinus at 70 with RBBB Imaging: CT head with small acute subdural hematoma of 7 mm as well as chronic left frontal and left occipital lobe infarctions CT C-spine without acute fracture CT maxillofacial without acute fracture or mandibular dislocation  The ED provider spoke with neurosurgeon Dr. Lacinda Axon who recommended repeating CT after 6 hours and discharge if stable Repeat CT head: Unchanged size of small subdural hematoma  Hospitalist consulted for admission due to troponin delta of 45-185   Review of Systems: As per HPI otherwise all other systems on review of systems negative.    Past Medical History:  Diagnosis Date  . Aneurysm of anterior cerebral artery   . Blindness of left eye    secondary to cataract surgery complication  . Depression   . Hypertension   . Myelofibrosis ([place])   . SAH (subarachnoid hemorrhage) ([place]) 02/2006  . Tobacco abuse     Past Surgical History:   Procedure Laterality Date  . ABDOMINAL HYSTERECTOMY    . ANEURYSM COILING    . CATARACT EXTRACTION    . CHOLECYSTECTOMY       reports that she quit smoking about 4 years ago. She has never used smokeless tobacco. She reports current alcohol use of about 1.0 standard drink of alcohol per week. She reports that she does not use drugs.  No Known Allergies  Family History  Problem Relation Age of Onset  . Breast cancer Daughter 65  . Breast cancer Sister   . Throat cancer Mother   . Lung cancer Father   . Prostate cancer Brother   . Leukemia Brother   . Prostate cancer Brother   . Leukemia Brother       Prior to Admission medications   Medication Sig Start Date End Date Taking? Authorizing Provider  amLODipine (NORVASC) 5 MG tablet Take 5 mg by mouth daily. 12/29/17 03/24/20  [provider]  aspirin 81 MG tablet Take 81 mg by mouth daily.    [provider]  citalopram (CELEXA) 20 MG tablet Take 20 mg by mouth daily.    [provider]  diphenhydramine-acetaminophen (TYLENOL PM) 25-500 MG TABS tablet Take 1 tablet by mouth at bedtime as needed. Pt splits medication into 3    [provider]  ferrous sulfate 325 (65 FE) MG tablet Take 325 mg by mouth once a week. Take 1 tablet on Sundays.    [provider]  lisinopril (PRINIVIL,ZESTRIL) 40 MG tablet Take 40  mg by mouth daily. 06/30/16   [provider]  omeprazole (PRILOSEC) 20 MG capsule Take 20 mg by mouth every morning.    [provider]  ruxolitinib phosphate (JAKAFI) 15 MG tablet Take 1 tablet (15 mg total) by mouth 2 (two) times daily. 12/12/19   Earlie Server, MD  Vitamin D, Ergocalciferol, (DRISDOL) 50000 units CAPS capsule Take 50,000 Units by mouth every Sunday.    [provider]    Physical Exam: Vitals:   04/28/20 1930 04/28/20 2000 04/28/20 2030 04/29/20 0313  BP: (!) 166/73 (!) 177/57 (!) 181/71 (!) 127/54  Pulse: 65 69 66 60  Resp: (!) 26 (!) 23  (!) 26 18  Temp:      TempSrc:      SpO2: (!) 89% 92% 99% 98%  Weight:      Height:         Vitals:   04/28/20 1930 04/28/20 2000 04/28/20 2030 04/29/20 0313  BP: (!) 166/73 (!) 177/57 (!) 181/71 (!) 127/54  Pulse: 65 69 66 60  Resp: (!) 26 (!) 23 (!) 26 18  Temp:      TempSrc:      SpO2: (!) 89% 92% 99% 98%  Weight:      Height:          Constitutional: Alert and oriented x 3 . Not in any apparent distress HEENT:      Head: Normocephalic and right periorbital contusion      Eyes: PERLA, EOMI, Conjunctivae are normal. Sclera is non-icteric.       Mouth/Throat: Mucous membranes are moist.       Neck: Supple with no signs of meningismus. Cardiovascular: Regular rate and rhythm. No murmurs, gallops, or rubs. 2+ symmetrical distal pulses are present . No JVD. No LE edema Respiratory: Respiratory effort normal .Lungs sounds clear bilaterally. No wheezes, crackles, or rhonchi.  Gastrointestinal: Soft, non tender, and non distended with positive bowel sounds.  Genitourinary: No CVA tenderness. Musculoskeletal: Nontender with normal range of motion in all extremities. No cyanosis, or erythema of extremities. Neurologic:  Face is symmetric. Moving all extremities. No gross focal neurologic deficits . Skin: Skin is warm, dry.  No rash or ulcers Psychiatric: Mood and affect are normal    Labs on Admission: I have personally reviewed following labs and imaging studies  CBC: Recent Labs  Lab 04/28/20 1728  WBC 16.6*  NEUTROABS 10.9*  HGB 10.5*  HCT 34.5*  MCV 98.0  PLT 161   Basic Metabolic Panel: Recent Labs  Lab 04/28/20 1728  NA 138  K 4.1  CL 104  CO2 25  GLUCOSE 108*  BUN 17  CREATININE 0.73  CALCIUM 9.1   GFR: Estimated Creatinine Clearance: 33.1 mL/min (by C-G formula based on SCr of 0.73 mg/dL). Liver Function Tests: Recent Labs  Lab 04/28/20 1728  AST 33  ALT 18  ALKPHOS 59  BILITOT 1.1  PROT 7.1  ALBUMIN 4.1   No results for input(s): LIPASE,  AMYLASE in the last 168 hours. No results for input(s): AMMONIA in the last 168 hours. Coagulation Profile: Recent Labs  Lab 04/28/20 1728  INR 1.0   Cardiac Enzymes: No results for input(s): CKTOTAL, CKMB, CKMBINDEX, TROPONINI in the last 168 hours. BNP (last 3 results) No results for input(s): PROBNP in the last 8760 hours. HbA1C: No results for input(s): HGBA1C in the last 72 hours. CBG: No results for input(s): GLUCAP in the last 168 hours. Lipid Profile: No results for input(s): CHOL,  HDL, LDLCALC, TRIG, CHOLHDL, LDLDIRECT in the last 72 hours. Thyroid Function Tests: No results for input(s): TSH, T4TOTAL, FREET4, T3FREE, THYROIDAB in the last 72 hours. Anemia Panel: No results for input(s): VITAMINB12, FOLATE, FERRITIN, TIBC, IRON, RETICCTPCT in the last 72 hours. Urine analysis:    Component Value Date/Time   COLORURINE YELLOW (A) 06/04/2019 1346   APPEARANCEUR TURBID (A) 06/04/2019 1346   LABSPEC 1.013 06/04/2019 1346   PHURINE 6.0 06/04/2019 1346   GLUCOSEU NEGATIVE 06/04/2019 1346   HGBUR SMALL (A) 06/04/2019 1346   BILIRUBINUR NEGATIVE 06/04/2019 1346   KETONESUR NEGATIVE 06/04/2019 1346   PROTEINUR 100 (A) 06/04/2019 1346   NITRITE POSITIVE (A) 06/04/2019 1346   LEUKOCYTESUR LARGE (A) 06/04/2019 1346    Radiological Exams on Admission: DG Chest 1 View  Result Date: 04/28/2020 CLINICAL DATA:  Fall, chest pain EXAM: CHEST  1 VIEW COMPARISON:  07/11/2016 FINDINGS: There is hyperinflation of the lungs compatible with COPD. Cardiomegaly. Aortic atherosclerosis. Patchy opacities noted in the left mid lung and right lung base concerning for pneumonia. No effusions or pneumothorax. No acute bony abnormality. IMPRESSION: Cardiomegaly, COPD. Patchy bilateral opacities concerning for pneumonia. Electronically Signed   By: Rolm Baptise M.D.   On: 04/28/2020 18:52   DG Forearm Right  Result Date: 04/28/2020 CLINICAL DATA:  Right-sided pain after a fall EXAM: RIGHT FOREARM  - 2 VIEW COMPARISON:  None. FINDINGS: There is no evidence of fracture or other focal bone lesions. A tiny calcifications are seen along the lateral epicondyle and lateral joint space. There is diffuse osteopenia. There is also calcifications along the TFCC. IMPRESSION: No acute osseous abnormality. Electronically Signed   By: Prudencio Pair M.D.   On: 04/28/2020 17:09   DG Tibia/Fibula Right  Result Date: 04/28/2020 CLINICAL DATA:  Fall EXAM: RIGHT TIBIA AND FIBULA - 2 VIEW COMPARISON:  None. FINDINGS: There is no evidence of fracture or other focal bone lesions. Vascular calcification. IMPRESSION: No fracture or dislocation. Electronically Signed   By: Macy Mis M.D.   On: 04/28/2020 17:11   CT Head Wo Contrast  Result Date: 04/29/2020 CLINICAL DATA:  Fall EXAM: CT HEAD WITHOUT CONTRAST TECHNIQUE: Contiguous axial images were obtained from the base of the skull through the vertex without intravenous contrast. COMPARISON:  04/28/2020 at 4:23 p.m. FINDINGS: Brain: Unchanged size of small subdural hematoma abutting the left frontal operculum. No new site of hemorrhage. Hyperdense focus in the left centrum semiovale is unchanged. There is periventricular hypoattenuation compatible with chronic microvascular disease. There are old left frontal and occipital infarcts. Vascular: Remote aneurysm treatment. Skull: Right frontal scalp hematoma.  No skull fracture. Sinuses/Orbits: Negative Other: None IMPRESSION: 1. Unchanged size of small subdural hematoma abutting the left frontal operculum. 2. Unchanged hyperdense focus in the left centrum semiovale, favored to be mineralization. 3. Right frontal scalp hematoma without skull fracture. Electronically Signed   By: Ulyses Jarred M.D.   On: 04/29/2020 00:01   CT Head Wo Contrast  Result Date: 04/28/2020 CLINICAL DATA:  Status post fall from car. Landed on right side. Laceration to right side. EXAM: CT HEAD WITHOUT CONTRAST CT MAXILLOFACIAL WITHOUT CONTRAST CT  CERVICAL SPINE WITHOUT CONTRAST TECHNIQUE: Multidetector CT imaging of the head, cervical spine, and maxillofacial structures were performed using the standard protocol without intravenous contrast. Multiplanar CT image reconstructions of the cervical spine and maxillofacial structures were also generated. COMPARISON:  MR head 06/25/2015, CT head 03/20/2006 FINDINGS: CT HEAD FINDINGS Brain: Patchy and confluent areas of decreased attenuation are  noted throughout the deep and periventricular white matter of the cerebral hemispheres bilaterally, compatible with chronic microvascular ischemic disease. Chronic left frontal lobe infarction. Chronic left occipital lobe infarction. No evidence of large-territorial acute infarction. Hyperdense foci within the left centrum semiovale that may represent an acute intraparenchymal hemorrhage. No mass lesion. There is hyperdense extra-axial density along the left calvarial convexity measuring up to 7 mm (4:32-33). No mass effect or midline shift. No hydrocephalus. Basilar cisterns are patent. Vascular: Prior aneurysmal clip noted along the level of the cavernous sinus noted. No hyperdense vessel. Atherosclerotic calcifications are present within the cavernous internal carotid and vertebral arteries. Skull: No acute fracture or focal lesion. Other: 6 mm right frontal scalp hematoma. CT MAXILLOFACIAL FINDINGS Osseous: No fracture or mandibular dislocation. No destructive process. Sinuses/Orbits: Paranasal sinuses and mastoid air cells are clear. Bilateral lens replacement. Otherwise the orbits are unremarkable. Soft tissues: Negative. CT CERVICAL SPINE FINDINGS Alignment: Grade 1 anterolisthesis of C3 on C4. Skull base and vertebrae: Multilevel degenerative changes of the spine. No acute fracture. No aggressive appearing focal osseous lesion or focal pathologic process. Soft tissues and spinal canal: No prevertebral fluid or swelling. No visible canal hematoma. Upper chest:  Biapical pleural/pulmonary scarring. Emphysematous changes. Other: Atherosclerotic plaque of the aorta and its major branches. IMPRESSION: 1. Small acute hematoma along the left calvarial convexity measuring up to 7 mm and likely representing a subdural hemorrhage. No associated midline shift. 2. Hyperdense foci within the left centrum semiovale that may represent an acute intraparenchymal hemorrhage. 3. Recommend repeat CT head in 4-6 hours for evaluation of stability. 4. 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 An acute component cannot be excluded in the left frontal lobe. If high clinical suspicion for possible infarction, consider MRI brain noncontrast for further evaluation. 5. No acute displaced facial fracture. 6. No acute displaced fracture or traumatic listhesis of the cervical spine. 7. Aortic Atherosclerosis (ICD10-I70.0) and Emphysema (ICD10-J43.9). These results were called by telephone at the time of interpretation on 04/28/20 at 5:06pmto provider PA Verne Carrow, who verbally acknowledged these results. Electronically Signed   By: Iven Finn M.D.   On: 04/28/2020 17:17   CT Cervical Spine Wo Contrast  Result Date: 04/28/2020 CLINICAL DATA:  Status post fall from car. Landed on right side. Laceration to right side. EXAM: CT HEAD WITHOUT CONTRAST CT MAXILLOFACIAL WITHOUT CONTRAST CT CERVICAL SPINE WITHOUT CONTRAST TECHNIQUE: Multidetector CT imaging of the head, cervical spine, and maxillofacial structures were performed using the standard protocol without intravenous contrast. Multiplanar CT image reconstructions of the cervical spine and maxillofacial structures were also generated. COMPARISON:  MR head 06/25/2015, CT head 03/20/2006 FINDINGS: CT HEAD FINDINGS Brain: Patchy and confluent areas of decreased attenuation are noted throughout the deep and periventricular white matter of the cerebral hemispheres bilaterally, compatible with chronic microvascular ischemic  disease. Chronic left frontal lobe infarction. Chronic left occipital lobe infarction. No evidence of large-territorial acute infarction. Hyperdense foci within the left centrum semiovale that may represent an acute intraparenchymal hemorrhage. No mass lesion. There is hyperdense extra-axial density along the left calvarial convexity measuring up to 7 mm (4:32-33). No mass effect or midline shift. No hydrocephalus. Basilar cisterns are patent. Vascular: Prior aneurysmal clip noted along the level of the cavernous sinus noted. No hyperdense vessel. Atherosclerotic calcifications are present within the cavernous internal carotid and vertebral arteries. Skull: No acute fracture or focal lesion. Other: 6 mm right frontal scalp hematoma. CT MAXILLOFACIAL FINDINGS Osseous: No fracture or mandibular dislocation. No  destructive process. Sinuses/Orbits: Paranasal sinuses and mastoid air cells are clear. Bilateral lens replacement. Otherwise the orbits are unremarkable. Soft tissues: Negative. CT CERVICAL SPINE FINDINGS Alignment: Grade 1 anterolisthesis of C3 on C4. Skull base and vertebrae: Multilevel degenerative changes of the spine. No acute fracture. No aggressive appearing focal osseous lesion or focal pathologic process. Soft tissues and spinal canal: No prevertebral fluid or swelling. No visible canal hematoma. Upper chest: Biapical pleural/pulmonary scarring. Emphysematous changes. Other: Atherosclerotic plaque of the aorta and its major branches. IMPRESSION: 1. Small acute hematoma along the left calvarial convexity measuring up to 7 mm and likely representing a subdural hemorrhage. No associated midline shift. 2. Hyperdense foci within the left centrum semiovale that may represent an acute intraparenchymal hemorrhage. 3. Recommend repeat CT head in 4-6 hours for evaluation of stability. 4. 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 An acute component cannot be excluded in the left frontal lobe. If  high clinical suspicion for possible infarction, consider MRI brain noncontrast for further evaluation. 5. No acute displaced facial fracture. 6. No acute displaced fracture or traumatic listhesis of the cervical spine. 7. Aortic Atherosclerosis (ICD10-I70.0) and Emphysema (ICD10-J43.9). These results were called by telephone at the time of interpretation on 04/28/20 at 5:06pmto provider PA Verne Carrow, who verbally acknowledged these results. Electronically Signed   By: Iven Finn M.D.   On: 04/28/2020 17:17   DG Humerus Right  Result Date: 04/28/2020 CLINICAL DATA:  Right-sided pain and fall EXAM: RIGHT HUMERUS - 2+ VIEW COMPARISON:  None. FINDINGS: There is no evidence of fracture or other focal bone lesions. Soft tissues are unremarkable. IMPRESSION: Negative. Electronically Signed   By: Prudencio Pair M.D.   On: 04/28/2020 17:08   DG Femur Min 2 Views Right  Result Date: 04/28/2020 CLINICAL DATA:  Right-sided pain after fall EXAM: RIGHT FEMUR 2 VIEWS COMPARISON:  None. FINDINGS: There is no evidence of fracture or other focal bone lesions. Moderate superior joint space loss and marginal osteophyte formation is seen at the femoroacetabular joint. There is also calcifications seen at the medial and lateral knee. Scattered vascular calcifications are noted. There is diffuse osteopenia. IMPRESSION: No acute osseous abnormality Electronically Signed   By: Prudencio Pair M.D.   On: 04/28/2020 17:10   CT Maxillofacial Wo Contrast  Result Date: 04/28/2020 CLINICAL DATA:  Status post fall from car. Landed on right side. Laceration to right side. EXAM: CT HEAD WITHOUT CONTRAST CT MAXILLOFACIAL WITHOUT CONTRAST CT CERVICAL SPINE WITHOUT CONTRAST TECHNIQUE: Multidetector CT imaging of the head, cervical spine, and maxillofacial structures were performed using the standard protocol without intravenous contrast. Multiplanar CT image reconstructions of the cervical spine and maxillofacial structures were also  generated. COMPARISON:  MR head 06/25/2015, CT head 03/20/2006 FINDINGS: CT HEAD FINDINGS Brain: Patchy and confluent areas of decreased attenuation are noted throughout the deep and periventricular white matter of the cerebral hemispheres bilaterally, compatible with chronic microvascular ischemic disease. Chronic left frontal lobe infarction. Chronic left occipital lobe infarction. No evidence of large-territorial acute infarction. Hyperdense foci within the left centrum semiovale that may represent an acute intraparenchymal hemorrhage. No mass lesion. There is hyperdense extra-axial density along the left calvarial convexity measuring up to 7 mm (4:32-33). No mass effect or midline shift. No hydrocephalus. Basilar cisterns are patent. Vascular: Prior aneurysmal clip noted along the level of the cavernous sinus noted. No hyperdense vessel. Atherosclerotic calcifications are present within the cavernous internal carotid and vertebral arteries. Skull: No acute fracture or focal lesion.  Other: 6 mm right frontal scalp hematoma. CT MAXILLOFACIAL FINDINGS Osseous: No fracture or mandibular dislocation. No destructive process. Sinuses/Orbits: Paranasal sinuses and mastoid air cells are clear. Bilateral lens replacement. Otherwise the orbits are unremarkable. Soft tissues: Negative. CT CERVICAL SPINE FINDINGS Alignment: Grade 1 anterolisthesis of C3 on C4. Skull base and vertebrae: Multilevel degenerative changes of the spine. No acute fracture. No aggressive appearing focal osseous lesion or focal pathologic process. Soft tissues and spinal canal: No prevertebral fluid or swelling. No visible canal hematoma. Upper chest: Biapical pleural/pulmonary scarring. Emphysematous changes. Other: Atherosclerotic plaque of the aorta and its major branches. IMPRESSION: 1. Small acute hematoma along the left calvarial convexity measuring up to 7 mm and likely representing a subdural hemorrhage. No associated midline shift. 2.  Hyperdense foci within the left centrum semiovale that may represent an acute intraparenchymal hemorrhage. 3. Recommend repeat CT head in 4-6 hours for evaluation of stability. 4. 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 An acute component cannot be excluded in the left frontal lobe. If high clinical suspicion for possible infarction, consider MRI brain noncontrast for further evaluation. 5. No acute displaced facial fracture. 6. No acute displaced fracture or traumatic listhesis of the cervical spine. 7. Aortic Atherosclerosis (ICD10-I70.0) and Emphysema (ICD10-J43.9). These results were called by telephone at the time of interpretation on 04/28/20 at 5:06pmto provider PA Verne Carrow, who verbally acknowledged these results. Electronically Signed   By: Iven Finn M.D.   On: 04/28/2020 17:17     Assessment/Plan 87 year old female with history of COPD, HTN,  cerebral aneurysm with hemorrhage and depression presenting following an accidental fall as she was trying to get out the car.      Elevated troponin -Troponin of 45/185 but without chest pain or acute EKG changes -Suspecting demand ischemia from acute injury -Continue to trend to peak -Consider cardiology consult if uptrending, but subdural hematoma will preclude heparin infusion at this time.    Subdural hematoma (HCC)   Accidental fall   Periorbital contusion, left, initial encounter -CT head with 7 mm subdural hematoma unchanged with repeat CT after 6 hours -Neurosurgical follow-up as outpatient per Dr. Lacinda Axon -Ice packs to periorbital region and pain control    COPD (chronic obstructive pulmonary disease) ([place]) -Continue home inhalers and duo nebs as needed    HTN (hypertension) -Continue home antihypertensives    DVT prophylaxis: SCDs Code Status: full code  Family Communication:  none  Disposition Plan: Back to previous home environment Consults called: none  Status: Observation    Athena Masse  MD [facility]     04/29/2020, 4:30 AM
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
## 2020-04-29 NOTE — Progress Notes (Signed)
Patient ID: [name], female   DOB: 1928-09-03, 87 y.o.   MRN: [id number]  Patient seen in the room. She just got up from the emergency room. Denies any headache. Denies any chest pain. Currently on 2 L nasal cannula oxygen. No respiratory distress however sats dipped down to 86. Troponin initially was 45 peak to 371 now trending down no acute EKG changes. I've asked Dr. Ubaldo Glassing to take a look at patient.  No aspirin or antiplatelet agent given subdural hematoma. No neural deficit. Repeat CT head this morning remains stable. PT OT to see patient TOC for discharge planning will discussed with daughter.

## 2020-04-29 NOTE — ED Notes (Signed)
Message sent to floor nurse .

## 2020-04-30 DIAGNOSIS — J449 Chronic obstructive pulmonary disease, unspecified: Secondary | ICD-10-CM

## 2020-04-30 DIAGNOSIS — S0512XA Contusion of eyeball and orbital tissues, left eye, initial encounter: Secondary | ICD-10-CM

## 2020-04-30 DIAGNOSIS — R778 Other specified abnormalities of plasma proteins: Secondary | ICD-10-CM | POA: Diagnosis not present

## 2020-04-30 LAB — ECHOCARDIOGRAM COMPLETE
Area-P 1/2: 2.39 cm2
Height: 62 in
P 1/2 time: 381 msec
S' Lateral: 2.71 cm
Weight: 1470.4 oz

## 2020-04-30 MED ORDER — ALBUTEROL SULFATE HFA 108 (90 BASE) MCG/ACT IN AERS: 2.0000 | INHALATION_SPRAY | Freq: Four times a day (QID) | RESPIRATORY_TRACT | 2 refills | Status: DC | PRN

## 2020-04-30 MED ORDER — IPRATROPIUM-ALBUTEROL 0.5-2.5 (3) MG/3ML IN SOLN: 3.0000 mL | Freq: Four times a day (QID) | RESPIRATORY_TRACT | Status: DC | PRN

## 2020-04-30 NOTE — Progress Notes (Signed)
Discharge instructions explained to pt and her daughter, [name]. IV and tele removed. Oxygen delivered to pt's room, pt and daughter educated on use. Will transport of unit via wheelchair.

## 2020-04-30 NOTE — Progress Notes (Signed)
SATURATION QUALIFICATIONS: (This note is used to comply with regulatory documentation for home oxygen)  Patient Saturations on Room Air at Rest = 85%  Patient Saturations on Room Air while Ambulating = 83%  Patient Saturations on 2Liters of oxygen while Ambulating = 92%

## 2020-04-30 NOTE — Discharge Instructions (Signed)
Use oxygen as directed Keep your right scap/forehead area dry
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
## 2020-04-30 NOTE — Discharge Summary (Signed)
[name] NAME: [name]    MR#:  [id number]  DATE OF BIRTH:  08-May-1928  DATE OF ADMISSION:  04/28/2020 ADMITTING PHYSICIAN: Athena Masse, MD  DATE OF DISCHARGE: 04/30/2020  PRIMARY CARE PHYSICIAN: Tracie Harrier, MD    ADMISSION DIAGNOSIS:  Brain bleed ([name]) [I61.9] Right leg pain [M79.604] Elevated troponin [R77.8] Right arm pain [M79.601]  DISCHARGE DIAGNOSIS:  Subdural hematoma acute right frontal scalp and lefts/p mechanical fall Acute left centrum semiovale acute intraparenchymal hemorrhage  SECONDARY DIAGNOSIS:   Past Medical History:  Diagnosis Date  . Aneurysm of anterior cerebral artery   . Blindness of left eye    secondary to cataract surgery complication  . Depression   . Hypertension   . Myelofibrosis ([name])   . SAH (subarachnoid hemorrhage) ([name]) 02/2006  . Tobacco abuse     HOSPITAL COURSE:   87 year old female with history of COPD, HTN,  cerebral aneurysm with hemorrhage and depression presenting following an accidental fall as she was trying to get out the car.     Subdural hematoma (HCC)  Accidental fall  Periorbital contusion, left, initial encounter -CT head with 7 mm subdural hematoma unchanged with repeat CT after 6 hours has been stable. --no neuro deficit -Neurosurgical follow-up as outpatient per Dr. Nori Riis hold ASA for 1 week -Ice packs to periorbital region and pain control  Elevated troponin -Troponin of 45/185/371//204  but without chest pain or acute EKG changes -Suspecting demand ischemia from acute injurypeak - cardiology consult  With Dr [name]. Cont to monitor. ECho EF 60-65% -- pt denies any cp    COPD (chronic obstructive pulmonary disease) (HCC) Hypoxia -Continue home inhalers and duo nebs as needed --sats 83%  On exertion. Improves with oxygenation --will set up home oxygen. --prn inhalers    HTN (hypertension) -Continue home  antihypertensives    DVT prophylaxis: SCDs Code Status: full code  Family Communication:  Dter [name] on the phone Disposition Plan: Back to previous home environment today Consults called: none  Status: Observation   Overall improving. D/c home CONSULTS OBTAINED:  Treatment Team:  Teodoro Spray, MD  DRUG ALLERGIES:  No Known Allergies  DISCHARGE MEDICATIONS:   Allergies as of 04/30/2020   No Known Allergies     Medication List    STOP taking these medications   aspirin 81 MG tablet     TAKE these medications   albuterol 108 (90 Base) MCG/ACT inhaler Commonly known as: VENTOLIN HFA Inhale 2 puffs into the lungs every 6 (six) hours as needed for wheezing or shortness of breath.   amLODipine 5 MG tablet Commonly known as: NORVASC Take 5 mg by mouth daily.   citalopram 20 MG tablet Commonly known as: CELEXA Take 20 mg by mouth daily.   diphenhydramine-acetaminophen 25-500 MG Tabs tablet Commonly known as: TYLENOL PM Take 1 tablet by mouth at bedtime as needed. Pt splits medication into 3   ferrous sulfate 325 (65 FE) MG tablet Take 325 mg by mouth once a week. Take 1 tablet on Sundays.   lisinopril 40 MG tablet Commonly known as: ZESTRIL Take 40 mg by mouth daily.   omeprazole 20 MG capsule Commonly known as: PRILOSEC Take 20 mg by mouth every morning.   ruxolitinib phosphate 15 MG tablet Commonly known as: JAKAFI Take 1 tablet (15 mg total) by mouth 2 (two) times daily.   Vitamin D (Ergocalciferol) 1.25 MG (50000 UNIT) Caps capsule Commonly known as: DRISDOL  Take 50,000 Units by mouth every Sunday.            Durable Medical Equipment  (From admission, onward)         Start     Ordered   04/30/20 0901  For home use only DME oxygen  Once       Question Answer Comment  Length of Need Lifetime   Mode or (Route) Nasal cannula   Liters per Minute 2   Frequency Continuous (stationary and portable oxygen unit needed)   Oxygen delivery  system Gas      03 /24/22 0901          If you experience worsening of your admission symptoms, develop shortness of breath, life threatening emergency, suicidal or homicidal thoughts you must seek medical attention immediately by calling 911 or calling your MD immediately  if symptoms less severe.  You Must read complete instructions/literature along with all the possible adverse reactions/side effects for all the Medicines you take and that have been prescribed to you. Take any new Medicines after you have completely understood and accept all the possible adverse reactions/side effects.   Please note  You were cared for by a hospitalist during your hospital stay. If you have any questions about your discharge medications or the care you received while you were in the hospital after you are discharged, you can call the unit and asked to speak with the hospitalist on call if the hospitalist that took care of you is not available. Once you are discharged, your primary care physician will handle any further medical issues. Please note that NO REFILLS for any discharge medications will be authorized once you are discharged, as it is imperative that you return to your primary care physician (or establish a relationship with a primary care physician if you do not have one) for your aftercare needs so that they can reassess your need for medications and monitor your lab values. Today   SUBJECTIVE   No new complaints Mild exertional sob with sats dropping  VITAL SIGNS:  Blood pressure (!) 157/58, pulse 64, temperature 97.8 F (36.6 C), temperature source Axillary, resp. rate 18, height 5\' 2"  (1.575 m), weight 39.1 kg, SpO2 96 %.  I/O:    Intake/Output Summary (Last 24 hours) at 04/30/2020 0907 Last data filed at 04/29/2020 1948 Gross per 24 hour  Intake 120 ml  Output --  Net 120 ml    PHYSICAL EXAMINATION:  GENERAL:  87 y.o.-year-old patient lying in the bed with no acute distress.   Right black eye, right forehead crusting + LUNGS: decreasec breath sounds bilaterally, no wheezing, rales, occ rhonchi or crepitation. No use of accessory muscles of respiration.  CARDIOVASCULAR: S1, S2 normal. No murmurs, rubs, or gallops.  ABDOMEN: Soft, non-tender, non-distended. Bowel sounds present. No organomegaly or mass.  EXTREMITIES: No pedal edema, cyanosis, or clubbing.  NEUROLOGIC: Cranial nerves II through XII are intact. Muscle strength 5/5 in all extremities. Sensation intact. Gait not checked.  PSYCHIATRIC: The patient is alert and oriented x 3.  SKIN: No obvious rash, lesion, or ulcer.   DATA REVIEW:   CBC  Recent Labs  Lab 04/28/20 1728  WBC 16.6*  HGB 10.5*  HCT 34.5*  PLT 214    Chemistries  Recent Labs  Lab 04/28/20 1728  NA 138  K 4.1  CL 104  CO2 25  GLUCOSE 108*  BUN 17  CREATININE 0.73  CALCIUM 9.1  AST 33  ALT 18  ALKPHOS 59  BILITOT 1.1    Microbiology Results   Recent Results (from the past 240 hour(s))  SARS CORONAVIRUS 2 (TAT 6-24 HRS) Nasopharyngeal Nasopharyngeal Swab     Status: None   Collection Time: 04/29/20 12:25 AM   Specimen: Nasopharyngeal Swab  Result Value Ref Range Status   SARS Coronavirus 2 NEGATIVE NEGATIVE Final    Comment: (NOTE) SARS-CoV-2 target nucleic acids are NOT DETECTED.  The SARS-CoV-2 RNA is generally detectable in upper and lower respiratory specimens during the acute phase of infection. Negative results do not preclude SARS-CoV-2 infection, do not rule out co-infections with other pathogens, and should not be used as the sole basis for treatment or other patient management decisions. Negative results must be combined with clinical observations, patient history, and epidemiological information. The expected result is Negative.  Fact Sheet for Patients: SugarRoll.be  Fact Sheet for Healthcare Providers: https://www.woods-mathews.com/  This test is not  yet approved or cleared by the [place] FDA and  has been authorized for detection and/or diagnosis of SARS-CoV-2 by FDA under an Emergency Use Authorization (EUA). This EUA will remain  in effect (meaning this test can be used) for the duration of the COVID-19 declaration under Se ction 564(b)(1) of the Act, 21 U.S.C. section 360bbb-3(b)(1), unless the authorization is terminated or revoked sooner.  Performed at [facility], [place] [address] [place], [place] [address]     RADIOLOGY:  DG Chest 1 View  Result Date: 04/28/2020 CLINICAL DATA:  Fall, chest pain EXAM: CHEST  1 VIEW COMPARISON:  07/11/2016 FINDINGS: There is hyperinflation of the lungs compatible with COPD. Cardiomegaly. Aortic atherosclerosis. Patchy opacities noted in the left mid lung and right lung base concerning for pneumonia. No effusions or pneumothorax. No acute bony abnormality. IMPRESSION: Cardiomegaly, COPD. Patchy bilateral opacities concerning for pneumonia. Electronically Signed   By: Rolm Baptise M.D.   On: 04/28/2020 18:52   DG Forearm Right  Result Date: 04/28/2020 CLINICAL DATA:  Right-sided pain after a fall EXAM: RIGHT FOREARM - 2 VIEW COMPARISON:  None. FINDINGS: There is no evidence of fracture or other focal bone lesions. A tiny calcifications are seen along the lateral epicondyle and lateral joint space. There is diffuse osteopenia. There is also calcifications along the TFCC. IMPRESSION: No acute osseous abnormality. Electronically Signed   By: Prudencio Pair M.D.   On: 04/28/2020 17:09   DG Tibia/Fibula Right  Result Date: 04/28/2020 CLINICAL DATA:  Fall EXAM: RIGHT TIBIA AND FIBULA - 2 VIEW COMPARISON:  None. FINDINGS: There is no evidence of fracture or other focal bone lesions. Vascular calcification. IMPRESSION: No fracture or dislocation. Electronically Signed   By: Macy Mis M.D.   On: 04/28/2020 17:11   CT Head Wo Contrast  Result Date: 04/29/2020 CLINICAL DATA:  Fall EXAM: CT  HEAD WITHOUT CONTRAST TECHNIQUE: Contiguous axial images were obtained from the base of the skull through the vertex without intravenous contrast. COMPARISON:  04/28/2020 at 4:23 p.m. FINDINGS: Brain: Unchanged size of small subdural hematoma abutting the left frontal operculum. No new site of hemorrhage. Hyperdense focus in the left centrum semiovale is unchanged. There is periventricular hypoattenuation compatible with chronic microvascular disease. There are old left frontal and occipital infarcts. Vascular: Remote aneurysm treatment. Skull: Right frontal scalp hematoma.  No skull fracture. Sinuses/Orbits: Negative Other: None IMPRESSION: 1. Unchanged size of small subdural hematoma abutting the left frontal operculum. 2. Unchanged hyperdense focus in the left centrum semiovale, favored to be mineralization. 3. Right frontal scalp hematoma without  skull fracture. Electronically Signed   By: Ulyses Jarred M.D.   On: 04/29/2020 00:01   CT Head Wo Contrast  Result Date: 04/28/2020 CLINICAL DATA:  Status post fall from car. Landed on right side. Laceration to right side. EXAM: CT HEAD WITHOUT CONTRAST CT MAXILLOFACIAL WITHOUT CONTRAST CT CERVICAL SPINE WITHOUT CONTRAST TECHNIQUE: Multidetector CT imaging of the head, cervical spine, and maxillofacial structures were performed using the standard protocol without intravenous contrast. Multiplanar CT image reconstructions of the cervical spine and maxillofacial structures were also generated. COMPARISON:  MR head 06/25/2015, CT head 03/20/2006 FINDINGS: CT HEAD FINDINGS Brain: Patchy and confluent areas of decreased attenuation are noted throughout the deep and periventricular white matter of the cerebral hemispheres bilaterally, compatible with chronic microvascular ischemic disease. Chronic left frontal lobe infarction. Chronic left occipital lobe infarction. No evidence of large-territorial acute infarction. Hyperdense foci within the left centrum semiovale that  may represent an acute intraparenchymal hemorrhage. No mass lesion. There is hyperdense extra-axial density along the left calvarial convexity measuring up to 7 mm (4:32-33). No mass effect or midline shift. No hydrocephalus. Basilar cisterns are patent. Vascular: Prior aneurysmal clip noted along the level of the cavernous sinus noted. No hyperdense vessel. Atherosclerotic calcifications are present within the cavernous internal carotid and vertebral arteries. Skull: No acute fracture or focal lesion. Other: 6 mm right frontal scalp hematoma. CT MAXILLOFACIAL FINDINGS Osseous: No fracture or mandibular dislocation. No destructive process. Sinuses/Orbits: Paranasal sinuses and mastoid air cells are clear. Bilateral lens replacement. Otherwise the orbits are unremarkable. Soft tissues: Negative. CT CERVICAL SPINE FINDINGS Alignment: Grade 1 anterolisthesis of C3 on C4. Skull base and vertebrae: Multilevel degenerative changes of the spine. No acute fracture. No aggressive appearing focal osseous lesion or focal pathologic process. Soft tissues and spinal canal: No prevertebral fluid or swelling. No visible canal hematoma. Upper chest: Biapical pleural/pulmonary scarring. Emphysematous changes. Other: Atherosclerotic plaque of the aorta and its major branches. IMPRESSION: 1. Small acute hematoma along the left calvarial convexity measuring up to 7 mm and likely representing a subdural hemorrhage. No associated midline shift. 2. Hyperdense foci within the left centrum semiovale that may represent an acute intraparenchymal hemorrhage. 3. Recommend repeat CT head in 4-6 hours for evaluation of stability. 4. 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 An acute component cannot be excluded in the left frontal lobe. If high clinical suspicion for possible infarction, consider MRI brain noncontrast for further evaluation. 5. No acute displaced facial fracture. 6. No acute displaced fracture or traumatic  listhesis of the cervical spine. 7. Aortic Atherosclerosis (ICD10-I70.0) and Emphysema (ICD10-J43.9). These results were called by telephone at the time of interpretation on 04/28/20 at 5:06pmto provider PA Verne Carrow, who verbally acknowledged these results. Electronically Signed   By: Iven Finn M.D.   On: 04/28/2020 17:17   CT Cervical Spine Wo Contrast  Result Date: 04/28/2020 CLINICAL DATA:  Status post fall from car. Landed on right side. Laceration to right side. EXAM: CT HEAD WITHOUT CONTRAST CT MAXILLOFACIAL WITHOUT CONTRAST CT CERVICAL SPINE WITHOUT CONTRAST TECHNIQUE: Multidetector CT imaging of the head, cervical spine, and maxillofacial structures were performed using the standard protocol without intravenous contrast. Multiplanar CT image reconstructions of the cervical spine and maxillofacial structures were also generated. COMPARISON:  MR head 06/25/2015, CT head 03/20/2006 FINDINGS: CT HEAD FINDINGS Brain: Patchy and confluent areas of decreased attenuation are noted throughout the deep and periventricular white matter of the cerebral hemispheres bilaterally, compatible with chronic microvascular ischemic disease. Chronic  left frontal lobe infarction. Chronic left occipital lobe infarction. No evidence of large-territorial acute infarction. Hyperdense foci within the left centrum semiovale that may represent an acute intraparenchymal hemorrhage. No mass lesion. There is hyperdense extra-axial density along the left calvarial convexity measuring up to 7 mm (4:32-33). No mass effect or midline shift. No hydrocephalus. Basilar cisterns are patent. Vascular: Prior aneurysmal clip noted along the level of the cavernous sinus noted. No hyperdense vessel. Atherosclerotic calcifications are present within the cavernous internal carotid and vertebral arteries. Skull: No acute fracture or focal lesion. Other: 6 mm right frontal scalp hematoma. CT MAXILLOFACIAL FINDINGS Osseous: No fracture or  mandibular dislocation. No destructive process. Sinuses/Orbits: Paranasal sinuses and mastoid air cells are clear. Bilateral lens replacement. Otherwise the orbits are unremarkable. Soft tissues: Negative. CT CERVICAL SPINE FINDINGS Alignment: Grade 1 anterolisthesis of C3 on C4. Skull base and vertebrae: Multilevel degenerative changes of the spine. No acute fracture. No aggressive appearing focal osseous lesion or focal pathologic process. Soft tissues and spinal canal: No prevertebral fluid or swelling. No visible canal hematoma. Upper chest: Biapical pleural/pulmonary scarring. Emphysematous changes. Other: Atherosclerotic plaque of the aorta and its major branches. IMPRESSION: 1. Small acute hematoma along the left calvarial convexity measuring up to 7 mm and likely representing a subdural hemorrhage. No associated midline shift. 2. Hyperdense foci within the left centrum semiovale that may represent an acute intraparenchymal hemorrhage. 3. Recommend repeat CT head in 4-6 hours for evaluation of stability. 4. 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 An acute component cannot be excluded in the left frontal lobe. If high clinical suspicion for possible infarction, consider MRI brain noncontrast for further evaluation. 5. No acute displaced facial fracture. 6. No acute displaced fracture or traumatic listhesis of the cervical spine. 7. Aortic Atherosclerosis (ICD10-I70.0) and Emphysema (ICD10-J43.9). These results were called by telephone at the time of interpretation on 04/28/20 at 5:06pmto provider PA Verne Carrow, who verbally acknowledged these results. Electronically Signed   By: Iven Finn M.D.   On: 04/28/2020 17:17   DG Humerus Right  Result Date: 04/28/2020 CLINICAL DATA:  Right-sided pain and fall EXAM: RIGHT HUMERUS - 2+ VIEW COMPARISON:  None. FINDINGS: There is no evidence of fracture or other focal bone lesions. Soft tissues are unremarkable. IMPRESSION: Negative.  Electronically Signed   By: Prudencio Pair M.D.   On: 04/28/2020 17:08   ECHOCARDIOGRAM COMPLETE  Result Date: 04/30/2020    ECHOCARDIOGRAM REPORT   Patient Name:   [name] Date of Exam: 04/29/2020 Medical Rec #:  [id number]          Height:       62.0 in Accession #:    [id number]         Weight:       91.9 lb Date of Birth:  [birth year]          BSA:          1.374 m Patient Age:    87 years           BP:           151/99 mmHg Patient Gender: F                  HR:           62 bpm. Exam Location:  [facility] Procedure: 2D Echo, Cardiac Doppler and Color Doppler Indications:     Elevated troponin  History:         Patient has  no prior history of Echocardiogram examinations.                  Risk Factors:Hypertension.  Sonographer:     Wilford Sports Rodgers-Jones Referring Phys:  [place] Diagnosing Phys: Bartholome Bill MD IMPRESSIONS  1. Left ventricular ejection fraction, by estimation, is 60 to 65%. The left ventricle has normal function. The left ventricle demonstrates regional wall motion abnormalities (see scoring diagram/findings for description). There is mild left ventricular  hypertrophy. Left ventricular diastolic parameters are consistent with Grade I diastolic dysfunction (impaired relaxation).  2. Right ventricular systolic function is normal. The right ventricular size is normal.  3. Left atrial size was mildly dilated.  4. Right atrial size was mildly dilated.  5. The mitral valve is grossly normal. Mild to moderate mitral valve regurgitation.  6. The aortic valve was not well visualized. Aortic valve regurgitation is mild to moderate. FINDINGS  Left Ventricle: Left ventricular ejection fraction, by estimation, is 60 to 65%. The left ventricle has normal function. The left ventricle demonstrates regional wall motion abnormalities. The left ventricular internal cavity size was normal in size. There is mild left ventricular hypertrophy. Left ventricular diastolic parameters are consistent with  Grade I diastolic dysfunction (impaired relaxation).  LV Wall Scoring: The apex is akinetic. The apical lateral segment and apical septal segment are hypokinetic. The antero-lateral wall, mid inferoseptal segment, and basal inferoseptal segment are normal. Right Ventricle: The right ventricular size is normal. No increase in right ventricular wall thickness. Right ventricular systolic function is normal. Left Atrium: Left atrial size was mildly dilated. Right Atrium: Right atrial size was mildly dilated. Pericardium: There is no evidence of pericardial effusion. Mitral Valve: The mitral valve is grossly normal. Mild to moderate mitral valve regurgitation. Tricuspid Valve: The tricuspid valve is grossly normal. Tricuspid valve regurgitation is mild. Aortic Valve: The aortic valve was not well visualized. Aortic valve regurgitation is mild to moderate. Aortic regurgitation PHT measures 381 msec. Pulmonic Valve: The pulmonic valve was not well visualized. Pulmonic valve regurgitation is trivial. Aorta: The aortic root is normal in size and structure. IAS/Shunts: The interatrial septum was not well visualized.  LEFT VENTRICLE PLAX 2D LVIDd:         4.33 cm  Diastology LVIDs:         2.71 cm  LV e' medial:    5.22 cm/s LV PW:         1.01 cm  LV E/e' medial:  17.8 LV IVS:        1.03 cm  LV e' lateral:   9.68 cm/s LVOT diam:     1.90 cm  LV E/e' lateral: 9.6 LV SV:         91 LV SV Index:   66 LVOT Area:     2.84 cm  RIGHT VENTRICLE             IVC RV Basal diam:  3.73 cm     IVC diam: 1.63 cm RV S prime:     11.30 cm/s TAPSE (M-mode): 1.8 cm LEFT ATRIUM             Index       RIGHT ATRIUM           Index LA diam:        4.40 cm 3.20 cm/m  RA Area:     13.30 cm LA Vol (A2C):   66.0 ml 48.05 ml/m RA Volume:   30.60 ml  22.28 ml/m  LA Vol (A4C):   48.2 ml 35.09 ml/m LA Biplane Vol: 57.3 ml 41.71 ml/m  AORTIC VALVE LVOT Vmax:   157.00 cm/s LVOT Vmean:  94.900 cm/s LVOT VTI:    0.320 m AI PHT:      381 msec  AORTA Ao  Root diam: 3.40 cm MITRAL VALVE               TRICUSPID VALVE MV Area (PHT): 2.39 cm    TR Peak grad:   28.3 mmHg MV Decel Time: 317 msec    TR Vmax:        266.00 cm/s MV E velocity: 92.70 cm/s MV A velocity: 93.40 cm/s  SHUNTS MV E/A ratio:  0.99        Systemic VTI:  0.32 m                            Systemic Diam: 1.90 cm Bartholome Bill MD Electronically signed by Bartholome Bill MD Signature Date/Time: 04/30/2020/8:24:44 AM    Final    DG Femur Min 2 Views Right  Result Date: 04/28/2020 CLINICAL DATA:  Right-sided pain after fall EXAM: RIGHT FEMUR 2 VIEWS COMPARISON:  None. FINDINGS: There is no evidence of fracture or other focal bone lesions. Moderate superior joint space loss and marginal osteophyte formation is seen at the femoroacetabular joint. There is also calcifications seen at the medial and lateral knee. Scattered vascular calcifications are noted. There is diffuse osteopenia. IMPRESSION: No acute osseous abnormality Electronically Signed   By: Prudencio Pair M.D.   On: 04/28/2020 17:10   CT Maxillofacial Wo Contrast  Result Date: 04/28/2020 CLINICAL DATA:  Status post fall from car. Landed on right side. Laceration to right side. EXAM: CT HEAD WITHOUT CONTRAST CT MAXILLOFACIAL WITHOUT CONTRAST CT CERVICAL SPINE WITHOUT CONTRAST TECHNIQUE: Multidetector CT imaging of the head, cervical spine, and maxillofacial structures were performed using the standard protocol without intravenous contrast. Multiplanar CT image reconstructions of the cervical spine and maxillofacial structures were also generated. COMPARISON:  MR head 06/25/2015, CT head 03/20/2006 FINDINGS: CT HEAD FINDINGS Brain: Patchy and confluent areas of decreased attenuation are noted throughout the deep and periventricular white matter of the cerebral hemispheres bilaterally, compatible with chronic microvascular ischemic disease. Chronic left frontal lobe infarction. Chronic left occipital lobe infarction. No evidence of  large-territorial acute infarction. Hyperdense foci within the left centrum semiovale that may represent an acute intraparenchymal hemorrhage. No mass lesion. There is hyperdense extra-axial density along the left calvarial convexity measuring up to 7 mm (4:32-33). No mass effect or midline shift. No hydrocephalus. Basilar cisterns are patent. Vascular: Prior aneurysmal clip noted along the level of the cavernous sinus noted. No hyperdense vessel. Atherosclerotic calcifications are present within the cavernous internal carotid and vertebral arteries. Skull: No acute fracture or focal lesion. Other: 6 mm right frontal scalp hematoma. CT MAXILLOFACIAL FINDINGS Osseous: No fracture or mandibular dislocation. No destructive process. Sinuses/Orbits: Paranasal sinuses and mastoid air cells are clear. Bilateral lens replacement. Otherwise the orbits are unremarkable. Soft tissues: Negative. CT CERVICAL SPINE FINDINGS Alignment: Grade 1 anterolisthesis of C3 on C4. Skull base and vertebrae: Multilevel degenerative changes of the spine. No acute fracture. No aggressive appearing focal osseous lesion or focal pathologic process. Soft tissues and spinal canal: No prevertebral fluid or swelling. No visible canal hematoma. Upper chest: Biapical pleural/pulmonary scarring. Emphysematous changes. Other: Atherosclerotic plaque of the aorta and its major branches. IMPRESSION: 1. Small acute hematoma along  the left calvarial convexity measuring up to 7 mm and likely representing a subdural hemorrhage. No associated midline shift. 2. Hyperdense foci within the left centrum semiovale that may represent an acute intraparenchymal hemorrhage. 3. Recommend repeat CT head in 4-6 hours for evaluation of stability. 4. 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 An acute component cannot be excluded in the left frontal lobe. If high clinical suspicion for possible infarction, consider MRI brain noncontrast for further evaluation.  5. No acute displaced facial fracture. 6. No acute displaced fracture or traumatic listhesis of the cervical spine. 7. Aortic Atherosclerosis (ICD10-I70.0) and Emphysema (ICD10-J43.9). These results were called by telephone at the time of interpretation on 04/28/20 at 5:06pmto provider PA Verne Carrow, who verbally acknowledged these results. Electronically Signed   By: Iven Finn M.D.   On: 04/28/2020 17:17     CODE STATUS:     Code Status Orders  (From admission, onward)         Start     Ordered   04/29/20 0430  Full code  Continuous        04/29/20 0430        Code Status History    Date Active Date Inactive Code Status Order ID Comments User Context   07/11/2016 1615 07/13/2016 1747 DNR [id number]  Vaughan Basta, MD ED   Advance Care Planning Activity       TOTAL TIME TAKING CARE OF THIS PATIENT: *35* minutes.    Fritzi Mandes M.D  [facility]    CC: Primary care physician; Tracie Harrier, MD

## 2020-04-30 NOTE — Evaluation (Signed)
Physical Therapy Evaluation Patient Details Name: [name] MRN: [id number] DOB: 1928-08-28 Today's Date: 04/30/2020   History of Present Illness  Pt is an 87 y.o. female presenting to hospital 3/22 s/p fall from car landing on R side.  Pt admitted with elevated troponin (suspect demand ischemia), 102mm subdural hematoma, accidental fall, and periorbital contusion L.  Repeat head CT unchanged.  PMH includes h/o aneuyrsm ACA with hemorrhage about 10 years ago, blindness L eye, htn, hyelofibrosis, aneurysm coiling.  Clinical Impression  Prior to hospital admission, pt was ambulatory; lives with her daughter in 1 level home with 1 STE L railing.  BP 151/57 prior to OOB mobility.  Currently pt is modified independent with bed mobility; CGA with transfers; and CGA with ambulation 120 feet with RW (pt unable to ambulate without UE support so utilized RW but pt requiring vc's to stay closer to RW for upright posture and within RW for turns).  Pt sitting on edge of bed (post ambulation) while therapist setting up chair for pt (to bring pt via rolling recliner to trial step) and pt suddenly reporting feeling really nauseas.  Emesis basin brought to pt and pt started dry heaving (mild clear liquid emesis also noted); pt's BP noted to be 206/78: therapist called for nurse to come assess pt.  Nurse came (pt's symptoms improving) and nurse retook BP 2 different times with BP gradually improving (186/74 to 168/70).  Pt resting in bed end of session and reporting symptoms resolved (pt reporting no pain and no headache during session).  MD notified of pt's symptoms and BP.  Pt would benefit from skilled PT to address noted impairments and functional limitations (see below for any additional details).  Upon hospital discharge, pt would benefit from HHPT, RW use, and 24/7 assist with functional mobility for safety.    Follow Up Recommendations Home health PT;Supervision/Assistance - 24 hour    Equipment  Recommendations  Rolling walker with 5" wheels;3in1 (PT) (youth sized)    Recommendations for Other Services       Precautions / Restrictions Precautions Precautions: Fall Precaution Comments: Monitor BP Restrictions Weight Bearing Restrictions: No      Mobility  Bed Mobility Overal bed mobility: Modified Independent             General bed mobility comments: Supine to/from sitting with HOB elevated    Transfers Overall transfer level: Needs assistance Equipment used: Rolling walker (2 wheeled) Transfers: Sit to/from Stand Sit to Stand: Min guard         General transfer comment: mild increased effort to stand from bed (x2 trials)  Ambulation/Gait Ambulation/Gait assistance: Min guard Gait Distance (Feet): 120 Feet Assistive device: Rolling walker (2 wheeled)   Gait velocity: decreased   General Gait Details: pt with forward flexed trunk posture requiring intermittent vc's for upright posture and to stay closer to RW in general (and within RW for turns)  Stairs Stairs:  (deferred d/t pt became nauseas and dry heaving)          Wheelchair Mobility    Modified Rankin (Stroke Patients Only)       Balance Overall balance assessment: Needs assistance Sitting-balance support: No upper extremity supported Sitting balance-Leahy Scale: Good Sitting balance - Comments: steady sitting reaching within BOS   Standing balance support: Single extremity supported Standing balance-Leahy Scale: Poor Standing balance comment: pt requiring at least single UE support for static standing balance  Pertinent Vitals/Pain Pain Assessment: No/denies pain  HR WFL during sessions activities.    Home Living Family/patient expects to be discharged to:: Private residence Living Arrangements: Children (pt's daughter) Available Help at Discharge: Family;Available 24 hours/day Type of Home: House Home Access: Stairs to  enter Entrance Stairs-Rails: Left Entrance Stairs-Number of Steps: 1 (to den) Home Layout: One level Home Equipment: Grab bars - toilet      Prior Function Level of Independence: Independent         Comments: Pt reports ambulatory (no AD) and no other falls in past 6 months.     Hand Dominance        Extremity/Trunk Assessment   Upper Extremity Assessment Upper Extremity Assessment: Generalized weakness    Lower Extremity Assessment Lower Extremity Assessment: Generalized weakness    Cervical / Trunk Assessment Cervical / Trunk Assessment: Kyphotic  Communication   Communication: HOH  Cognition Arousal/Alertness: Awake/alert Behavior During Therapy: WFL for tasks assessed/performed Overall Cognitive Status: No family/caregiver present to determine baseline cognitive functioning                                 General Comments: Oriented to person, place, and general situation.      General Comments General comments (skin integrity, edema, etc.): facial bruising noted.  Nursing cleared pt for participation in physical therapy.  Pt agreeable to PT session.    Exercises     Assessment/Plan    PT Assessment Patient needs continued PT services  PT Problem List Decreased strength;Decreased activity tolerance;Decreased balance;Decreased mobility;Decreased knowledge of use of DME;Cardiopulmonary status limiting activity       PT Treatment Interventions DME instruction;Gait training;Stair training;Functional mobility training;Therapeutic activities;Therapeutic exercise;Balance training;Patient/family education    PT Goals (Current goals can be found in the Care Plan section)  Acute Rehab PT Goals Patient Stated Goal: to go home PT Goal Formulation: With patient Time For Goal Achievement: 05/14/20 Potential to Achieve Goals: Good    Frequency Min 2X/week   Barriers to discharge        Co-evaluation               AM-PAC PT "6 Clicks"  Mobility  Outcome Measure Help needed turning from your back to your side while in a flat bed without using bedrails?: None Help needed moving from lying on your back to sitting on the side of a flat bed without using bedrails?: A Little Help needed moving to and from a bed to a chair (including a wheelchair)?: A Little Help needed standing up from a chair using your arms (e.g., wheelchair or bedside chair)?: A Little Help needed to walk in hospital room?: A Little Help needed climbing 3-5 steps with a railing? : A Little 6 Click Score: 19    End of Session Equipment Utilized During Treatment: Gait belt;Oxygen (2 L via nasal cannula) Activity Tolerance: Other (comment) (Limited d/t nausea and elevated BP) Patient left: in bed;with call bell/phone within reach;with bed alarm set Nurse Communication: Mobility status;Precautions;Other (comment) (pt's nausea and elevated BP) PT Visit Diagnosis: Other abnormalities of gait and mobility (R26.89);Unsteadiness on feet (R26.81);Muscle weakness (generalized) (M62.81);History of falling (Z91.81)    Time: 5277-8242 PT Time Calculation (min) (ACUTE ONLY): 40 min   Charges:   PT Evaluation $PT Eval Low Complexity: 1 Low PT Treatments $Therapeutic Exercise: 8-22 mins $Therapeutic Activity: 8-22 mins       Leitha Bleak, PT 04/30/20, 10:58 AM

## 2020-04-30 NOTE — Progress Notes (Signed)
Pt walked with physical therapy, felt nauseous. BP 207/76, pt back to bed, stated she felt better, BP now 168/70. MD made aware. Continue to monitor, let pt eat lunch and if tolerates well, will discharge later this afternoon. No other new orders at this time.

## 2020-04-30 NOTE — TOC Transition Note (Signed)
Transition of Care [facility]) - CM/SW Discharge Note   Patient Details  Name: KAMBREY HAGGER MRN: [id number] Date of Birth: [birth year]  Transition of Care [facility] CM/SW Contact:  Kerin Salen, RN Phone Number: 04/30/2020, 1:58 PM    Final next level of care: Home/Self Care Barriers to Discharge: Barriers Resolved   Patient Goals and CMS Choice Patient states their goals for this hospitalization and ongoing recovery are:: To return home   Choice offered to / list presented to : NA  Discharge Placement                Patient to be transferred to facility by: Family member   Patient and family notified of of transfer: 04/30/20  Discharge Plan and Services                DME Arranged: Oxygen DME Agency: [employer] Date DME Agency Contacted: 04/30/20 Time DME Agency Contacted: 0900 Representative spoke with at DME Agency: Andree Coss HH Arranged: NA [place] Agency: NA        Social Determinants of Health ([place]) Interventions     Readmission Risk Interventions No flowsheet data found.

## 2020-05-04 ENCOUNTER — Other Ambulatory Visit: Payer: Medicare Other

## 2020-05-05 ENCOUNTER — Telehealth: Payer: Medicare Other | Admitting: Oncology

## 2020-05-05 ENCOUNTER — Other Ambulatory Visit: Payer: Medicare Other

## 2020-05-18 ENCOUNTER — Other Ambulatory Visit: Payer: Self-pay

## 2020-05-18 ENCOUNTER — Inpatient Hospital Stay: Payer: Medicare Other | Attending: Oncology

## 2020-05-18 DIAGNOSIS — D7581 Myelofibrosis: Secondary | ICD-10-CM | POA: Diagnosis not present

## 2020-05-18 LAB — CBC WITH DIFFERENTIAL/PLATELET
Abs Immature Granulocytes: 3.13 10*3/uL — ABNORMAL HIGH (ref 0.00–0.07)
Basophils Absolute: 0.7 10*3/uL — ABNORMAL HIGH (ref 0.0–0.1)
Basophils Relative: 5 %
Eosinophils Absolute: 0 10*3/uL (ref 0.0–0.5)
Eosinophils Relative: 0 %
HCT: 37.3 % (ref 36.0–46.0)
Hemoglobin: 11.4 g/dL — ABNORMAL LOW (ref 12.0–15.0)
Immature Granulocytes: 20 %
Lymphocytes Relative: 7 %
Lymphs Abs: 1.1 10*3/uL (ref 0.7–4.0)
MCH: 30.3 pg (ref 26.0–34.0)
MCHC: 30.6 g/dL (ref 30.0–36.0)
MCV: 99.2 fL (ref 80.0–100.0)
Monocytes Absolute: 0.9 10*3/uL (ref 0.1–1.0)
Monocytes Relative: 6 %
Neutro Abs: 9.6 10*3/uL — ABNORMAL HIGH (ref 1.7–7.7)
Neutrophils Relative %: 62 %
Platelets: 251 10*3/uL (ref 150–400)
RBC: 3.76 MIL/uL — ABNORMAL LOW (ref 3.87–5.11)
RDW: 21.1 % — ABNORMAL HIGH (ref 11.5–15.5)
Smear Review: ADEQUATE
WBC: 15.6 10*3/uL — ABNORMAL HIGH (ref 4.0–10.5)
nRBC: 9.4 % — ABNORMAL HIGH (ref 0.0–0.2)

## 2020-05-18 LAB — COMPREHENSIVE METABOLIC PANEL
ALT: 18 U/L (ref 0–44)
AST: 31 U/L (ref 15–41)
Albumin: 4.2 g/dL (ref 3.5–5.0)
Alkaline Phosphatase: 55 U/L (ref 38–126)
Anion gap: 8 (ref 5–15)
BUN: 15 mg/dL (ref 8–23)
CO2: 29 mmol/L (ref 22–32)
Calcium: 8.8 mg/dL — ABNORMAL LOW (ref 8.9–10.3)
Chloride: 104 mmol/L (ref 98–111)
Creatinine, Ser: 0.85 mg/dL (ref 0.44–1.00)
GFR, Estimated: >60 mL/min (ref >60)
Glucose, Bld: 101 mg/dL — ABNORMAL HIGH (ref 70–99)
Potassium: 4 mmol/L (ref 3.5–5.1)
Sodium: 141 mmol/L (ref 135–145)
Total Bilirubin: 1.1 mg/dL (ref 0.3–1.2)
Total Protein: 7.1 g/dL (ref 6.5–8.1)

## 2020-05-18 LAB — LACTATE DEHYDROGENASE: LDH: 918 U/L — ABNORMAL HIGH (ref 98–192)

## 2020-05-19 ENCOUNTER — Inpatient Hospital Stay (HOSPITAL_BASED_OUTPATIENT_CLINIC_OR_DEPARTMENT_OTHER): Payer: Medicare Other | Admitting: Oncology

## 2020-05-19 ENCOUNTER — Encounter: Payer: Self-pay | Admitting: Oncology

## 2020-05-19 DIAGNOSIS — D7581 Myelofibrosis: Secondary | ICD-10-CM | POA: Diagnosis not present

## 2020-05-19 MED ORDER — RUXOLITINIB PHOSPHATE 15 MG PO TABS: 15.0000 mg | ORAL_TABLET | Freq: Two times a day (BID) | ORAL | 3 refills | Status: DC

## 2020-05-19 NOTE — Progress Notes (Signed)
HEMATOLOGY-ONCOLOGY TeleHEALTH VISIT PROGRESS NOTE  I connected with [name] on 05/19/20  at  2:30 PM EDT by video enabled telemedicine visit and verified that I am speaking with the correct person using two identifiers. I discussed the limitations, risks, security and privacy concerns of performing an evaluation and management service by telemedicine and the availability of in-person appointments. The patient expressed understanding and agreed to proceed.   Other persons participating in the visit and their role in the encounter:  None  Patient's location: Home  Provider's location: office Chief Complaint: Myelofibrosis.   INTERVAL HISTORY [name] is a 87 y.o. female who has above history reviewed by me today presents for follow up visit for management of myelofibrosis Problems and complaints are listed below:  Patient had a fall in March 2022 and had intracranial hematoma.  She lost a few pounds during the hospitalization.  She still has some bruising on her face.  Appetite remains good.  No other new complaints. I attempted to connect the patient for visual enabled telehealth visit.  Due to the technical difficulties with video,  Patient was transitioned to audio only visit.   Review of Systems  Constitutional: Negative for appetite change, chills, fatigue and fever.  HENT:   Negative for hearing loss and voice change.   Eyes: Negative for eye problems.  Respiratory: Negative for chest tightness and cough.   Cardiovascular: Negative for chest pain.  Gastrointestinal: Negative for abdominal distention, abdominal pain and blood in stool.  Endocrine: Negative for hot flashes.  Genitourinary: Negative for difficulty urinating and frequency.   Musculoskeletal: Negative for arthralgias.  Skin: Negative for itching and rash.  Neurological: Negative for extremity weakness.  Hematological: Negative for adenopathy.  Psychiatric/Behavioral: Negative for confusion.     Past Medical History:  Diagnosis Date  . Aneurysm of anterior cerebral artery   . Blindness of left eye    secondary to cataract surgery complication  . Depression   . Hypertension   . Myelofibrosis ([place])   . SAH (subarachnoid hemorrhage) ([place]) 02/2006  . Tobacco abuse    Past Surgical History:  Procedure Laterality Date  . ABDOMINAL HYSTERECTOMY    . ANEURYSM COILING    . CATARACT EXTRACTION    . CHOLECYSTECTOMY      Family History  Problem Relation Age of Onset  . Breast cancer Daughter 53  . Breast cancer Sister   . Throat cancer Mother   . Lung cancer Father   . Prostate cancer Brother   . Leukemia Brother   . Prostate cancer Brother   . Leukemia Brother     Social History   Socioeconomic History  . Marital status: Widowed    Spouse name: Not on file  . Number of children: 1  . Years of education: Not on file  . Highest education level: Not on file  Occupational History  . Not on file  Tobacco Use  . Smoking status: Former Smoker    Quit date: 07/31/2015    Years since quitting: 4.8  . Smokeless tobacco: Never Used  Vaping Use  . Vaping Use: Never used  Substance and Sexual Activity  . Alcohol use: Yes    Alcohol/week: 1.0 standard drink    Types: 1 Glasses of wine per week    Comment: once a month  . Drug use: No  . Sexual activity: Not on file  Other Topics Concern  . Not on file  Social History Narrative  . Not on file  Social Determinants of Health   Financial Resource Strain: Not on file  Food Insecurity: Not on file  Transportation Needs: Not on file  Physical Activity: Not on file  Stress: Not on file  Social Connections: Not on file  Intimate Partner Violence: Not on file    Current Outpatient Medications on File Prior to Visit  Medication Sig Dispense Refill  . amLODipine (NORVASC) 5 MG tablet Take 5 mg by mouth daily.    . citalopram (CELEXA) 20 MG tablet Take 20 mg by mouth daily.    . diphenhydramine-acetaminophen (TYLENOL PM)  25-500 MG TABS tablet Take 1 tablet by mouth at bedtime as needed. Pt splits medication into 3    . ferrous sulfate 325 (65 FE) MG tablet Take 325 mg by mouth once a week. Take 1 tablet on Sundays.    Marland Kitchen lisinopril (PRINIVIL,ZESTRIL) 40 MG tablet Take 40 mg by mouth daily.    Marland Kitchen omeprazole (PRILOSEC) 20 MG capsule Take 20 mg by mouth every morning.    . ruxolitinib phosphate (JAKAFI) 15 MG tablet Take 1 tablet (15 mg total) by mouth 2 (two) times daily. 60 tablet 3  . Vitamin D, Ergocalciferol, (DRISDOL) 50000 units CAPS capsule Take 50,000 Units by mouth every Sunday.    Marland Kitchen albuterol (VENTOLIN HFA) 108 (90 Base) MCG/ACT inhaler Inhale 2 puffs into the lungs every 6 (six) hours as needed for wheezing or shortness of breath. (Patient not taking: Reported on 05/19/2020) 8 g 2   No current facility-administered medications on file prior to visit.    No Known Allergies     Observations/Objective: There were no vitals filed for this visit. There is no height or weight on file to calculate BMI.  Physical Exam  CBC    Component Value Date/Time   WBC 15.6 (H) 05/18/2020 1352   RBC 3.76 (L) 05/18/2020 1352   HGB 11.4 (L) 05/18/2020 1352   HCT 37.3 05/18/2020 1352   PLT 251 05/18/2020 1352   MCV 99.2 05/18/2020 1352   MCH 30.3 05/18/2020 1352   MCHC 30.6 05/18/2020 1352   RDW 21.1 (H) 05/18/2020 1352   LYMPHSABS 1.1 05/18/2020 1352   MONOABS 0.9 05/18/2020 1352   EOSABS 0.0 05/18/2020 1352   BASOSABS 0.7 (H) 05/18/2020 1352    CMP     Component Value Date/Time   NA 141 05/18/2020 1352   K 4.0 05/18/2020 1352   CL 104 05/18/2020 1352   CO2 29 05/18/2020 1352   GLUCOSE 101 (H) 05/18/2020 1352   BUN 15 05/18/2020 1352   CREATININE 0.85 05/18/2020 1352   CALCIUM 8.8 (L) 05/18/2020 1352   PROT 7.1 05/18/2020 1352   ALBUMIN 4.2 05/18/2020 1352   AST 31 05/18/2020 1352   ALT 18 05/18/2020 1352   ALKPHOS 55 05/18/2020 1352   BILITOT 1.1 05/18/2020 1352   GFRNONAA >60 05/18/2020 1352    GFRAA >60 09/12/2019 1114     Assessment and Plan: 1. 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  Counts are stable. Recommend patient to continue Jakafi 50 mg twice daily.  LDH is slightly increased to 918.  Chronically elevated.  Continue monitor.  Mild anemia, hemoglobin has been stable at 11.4.  Follow Up Instructions: 2 months   I discussed the assessment and treatment plan with the patient. The patient was provided an opportunity to ask questions and all were answered. The patient agreed with the plan and demonstrated an understanding  of the instructions.  The patient was advised to call back or seek an in-person evaluation if the symptoms worsen or if the condition fails to improve as anticipated.    Earlie Server, MD 05/19/2020 9:46 PM

## 2020-05-19 NOTE — Addendum Note (Signed)
Addended by: Earlie Server on: 05/19/2020 09:49 PM   Modules accepted: Orders

## 2020-05-20 ENCOUNTER — Telehealth: Payer: Self-pay | Admitting: *Deleted

## 2020-05-20 DIAGNOSIS — D7581 Myelofibrosis: Secondary | ICD-10-CM

## 2020-05-20 MED ORDER — RUXOLITINIB PHOSPHATE 15 MG PO TABS: 15.0000 mg | ORAL_TABLET | Freq: Two times a day (BID) | ORAL | 3 refills | Status: DC

## 2020-05-20 NOTE — Telephone Encounter (Signed)
Florin called and said that Shanon Brow is  unable to be filled at local pharmacy, thinks it should go to specialty pharmacy. Please resubmit to appropriate pharmacy

## 2020-07-24 ENCOUNTER — Inpatient Hospital Stay: Payer: Medicare Other | Attending: Oncology

## 2020-07-24 ENCOUNTER — Other Ambulatory Visit: Payer: Self-pay

## 2020-07-24 ENCOUNTER — Encounter: Payer: Self-pay | Admitting: Nurse Practitioner

## 2020-07-24 ENCOUNTER — Inpatient Hospital Stay (HOSPITAL_BASED_OUTPATIENT_CLINIC_OR_DEPARTMENT_OTHER): Payer: Medicare Other | Admitting: Nurse Practitioner

## 2020-07-24 VITALS — BP 151/60 | HR 66 | Temp 99.0°F | Resp 16 | Wt 92.9 lb

## 2020-07-24 DIAGNOSIS — D7581 Myelofibrosis: Secondary | ICD-10-CM

## 2020-07-24 DIAGNOSIS — D649 Anemia, unspecified: Secondary | ICD-10-CM | POA: Insufficient documentation

## 2020-07-24 DIAGNOSIS — Z90710 Acquired absence of both cervix and uterus: Secondary | ICD-10-CM | POA: Diagnosis not present

## 2020-07-24 DIAGNOSIS — Z1589 Genetic susceptibility to other disease: Secondary | ICD-10-CM

## 2020-07-24 DIAGNOSIS — R634 Abnormal weight loss: Secondary | ICD-10-CM | POA: Diagnosis not present

## 2020-07-24 DIAGNOSIS — Z87891 Personal history of nicotine dependence: Secondary | ICD-10-CM | POA: Diagnosis not present

## 2020-07-24 LAB — CBC WITH DIFFERENTIAL/PLATELET
Abs Immature Granulocytes: 2.8 10*3/uL — ABNORMAL HIGH (ref 0.00–0.07)
Band Neutrophils: 7 %
Basophils Absolute: 0.6 10*3/uL — ABNORMAL HIGH (ref 0.0–0.1)
Basophils Relative: 3 %
Blasts: 1 %
Eosinophils Absolute: 0 10*3/uL (ref 0.0–0.5)
Eosinophils Relative: 0 %
HCT: 35.7 % — ABNORMAL LOW (ref 36.0–46.0)
Hemoglobin: 11.1 g/dL — ABNORMAL LOW (ref 12.0–15.0)
Lymphocytes Relative: 11 %
Lymphs Abs: 2.1 10*3/uL (ref 0.7–4.0)
MCH: 30.7 pg (ref 26.0–34.0)
MCHC: 31.1 g/dL (ref 30.0–36.0)
MCV: 98.6 fL (ref 80.0–100.0)
Metamyelocytes Relative: 2 %
Monocytes Absolute: 0.6 10*3/uL (ref 0.1–1.0)
Monocytes Relative: 3 %
Myelocytes: 13 %
Neutro Abs: 12.7 10*3/uL — ABNORMAL HIGH (ref 1.7–7.7)
Neutrophils Relative %: 60 %
Platelets: 244 10*3/uL (ref 150–400)
RBC: 3.62 MIL/uL — ABNORMAL LOW (ref 3.87–5.11)
RDW: 20.5 % — ABNORMAL HIGH (ref 11.5–15.5)
Smear Review: ADEQUATE
WBC: 18.9 10*3/uL — ABNORMAL HIGH (ref 4.0–10.5)
nRBC: 10 % — ABNORMAL HIGH (ref 0.0–0.2)

## 2020-07-24 LAB — COMPREHENSIVE METABOLIC PANEL
ALT: 15 U/L (ref 0–44)
AST: 28 U/L (ref 15–41)
Albumin: 4.2 g/dL (ref 3.5–5.0)
Alkaline Phosphatase: 51 U/L (ref 38–126)
Anion gap: 9 (ref 5–15)
BUN: 20 mg/dL (ref 8–23)
CO2: 28 mmol/L (ref 22–32)
Calcium: 9.4 mg/dL (ref 8.9–10.3)
Chloride: 104 mmol/L (ref 98–111)
Creatinine, Ser: 0.79 mg/dL (ref 0.44–1.00)
GFR, Estimated: >60 mL/min (ref >60)
Glucose, Bld: 100 mg/dL — ABNORMAL HIGH (ref 70–99)
Potassium: 4.7 mmol/L (ref 3.5–5.1)
Sodium: 141 mmol/L (ref 135–145)
Total Bilirubin: 0.5 mg/dL (ref 0.3–1.2)
Total Protein: 7.3 g/dL (ref 6.5–8.1)

## 2020-07-24 NOTE — Progress Notes (Signed)
Patient here for oncology follow-up appointment, expresses concerns of fatigue

## 2020-07-24 NOTE — Progress Notes (Signed)
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 performed using the standard protocol without intravenous contrast. Multiplanar CT image reconstructions of the cervical spine and maxillofacial structures were also generated. COMPARISON:  MR head 06/25/2015, CT head 03/20/2006 FINDINGS: CT HEAD FINDINGS Brain: Patchy and confluent areas of decreased attenuation are noted throughout the deep and periventricular white matter of the cerebral hemispheres bilaterally, compatible with chronic microvascular ischemic disease. Chronic left frontal lobe infarction. Chronic left occipital lobe infarction. No evidence of large-territorial acute infarction. Hyperdense foci within the left centrum semiovale that may represent an acute intraparenchymal hemorrhage. No mass lesion. There is hyperdense extra-axial density along the left calvarial convexity measuring up to 7 mm (4:32-33). No mass effect or midline shift. No hydrocephalus. Basilar cisterns are patent. Vascular: Prior aneurysmal clip noted along the level of the cavernous sinus noted. No hyperdense vessel. Atherosclerotic calcifications are present within  the cavernous internal carotid and vertebral arteries. Skull: No acute fracture or focal lesion. Other: 6 mm right frontal scalp hematoma. CT MAXILLOFACIAL FINDINGS Osseous: No fracture or mandibular dislocation. No destructive process. Sinuses/Orbits: Paranasal sinuses and mastoid air cells are clear. Bilateral lens replacement. Otherwise the orbits are unremarkable. Soft tissues: Negative. CT CERVICAL SPINE FINDINGS Alignment: Grade 1 anterolisthesis of C3 on C4. Skull base and vertebrae: Multilevel degenerative changes of the spine. No acute fracture. No aggressive appearing focal osseous lesion or focal pathologic process. Soft tissues and spinal canal: No prevertebral fluid or swelling. No visible canal hematoma. Upper chest: Biapical pleural/pulmonary scarring. Emphysematous changes. Other: Atherosclerotic plaque of the aorta and its major branches. IMPRESSION: 1. Small acute hematoma along the left calvarial convexity measuring up to 7 mm and likely representing a subdural hemorrhage. No associated midline shift. 2. Hyperdense foci within the left centrum semiovale that may represent an acute intraparenchymal hemorrhage. 3. Recommend repeat CT head in 4-6 hours for evaluation of stability. 4. 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 An acute component cannot be excluded in the left frontal lobe. If high clinical suspicion for possible infarction, consider MRI brain noncontrast for further evaluation. 5. No acute displaced facial fracture. 6. No acute displaced fracture or traumatic listhesis of the cervical spine. 7. Aortic Atherosclerosis (ICD10-I70.0) and Emphysema (ICD10-J43.9). These results were called by telephone at the time of interpretation on 04/28/20 at 5:06pmto provider PA Verne Carrow, who verbally acknowledged these results. Electronically Signed   By: Iven Finn M.D.   On: 04/28/2020 17:17     ASSESSMENT & PLAN: Patient is 87 year old female who returns to clinic for  follow-up for myelofibrosis No diagnosis found.  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  Tolerating well without significant side effects.  Anemia continues to remain at baseline  and mild.  Clinically doing well continue Jakafi as prescribed.  2.  Weight loss- stable. Encouraged nutritional supplement drinks such as ensure or boost.   Plan:  3 mo- lab, [place]/Dr.Yu   No orders of the defined types were placed in this encounter.  Beckey Rutter, DNP, AGNP-C [place] at [facility] [phone] (clinic)

## 2020-08-28 ENCOUNTER — Telehealth: Payer: Self-pay | Admitting: *Deleted

## 2020-08-28 DIAGNOSIS — D7581 Myelofibrosis: Secondary | ICD-10-CM

## 2020-08-28 MED ORDER — RUXOLITINIB PHOSPHATE 15 MG PO TABS: 15.0000 mg | ORAL_TABLET | Freq: Two times a day (BID) | ORAL | 3 refills | Status: DC

## 2020-08-28 NOTE — Telephone Encounter (Signed)
[place] and needed a [place] script asap.   She is requesting RF asap to be sent to her home.  Benjamine Mola, RN spoke with Dr. Tasia Catchings, who approved Rf to be sent.

## 2020-10-23 ENCOUNTER — Encounter: Payer: Self-pay | Admitting: Oncology

## 2020-10-23 ENCOUNTER — Other Ambulatory Visit: Payer: Self-pay

## 2020-10-23 ENCOUNTER — Inpatient Hospital Stay: Payer: Medicare Other | Attending: Oncology

## 2020-10-23 ENCOUNTER — Inpatient Hospital Stay (HOSPITAL_BASED_OUTPATIENT_CLINIC_OR_DEPARTMENT_OTHER): Payer: Medicare Other | Admitting: Oncology

## 2020-10-23 VITALS — BP 169/96 | HR 60 | Temp 96.8°F | Resp 18 | Wt 91.5 lb

## 2020-10-23 DIAGNOSIS — E876 Hypokalemia: Secondary | ICD-10-CM | POA: Insufficient documentation

## 2020-10-23 DIAGNOSIS — D471 Chronic myeloproliferative disease: Secondary | ICD-10-CM | POA: Insufficient documentation

## 2020-10-23 DIAGNOSIS — Z1589 Genetic susceptibility to other disease: Secondary | ICD-10-CM

## 2020-10-23 DIAGNOSIS — Z5111 Encounter for antineoplastic chemotherapy: Secondary | ICD-10-CM

## 2020-10-23 DIAGNOSIS — D649 Anemia, unspecified: Secondary | ICD-10-CM | POA: Diagnosis not present

## 2020-10-23 DIAGNOSIS — R634 Abnormal weight loss: Secondary | ICD-10-CM | POA: Diagnosis not present

## 2020-10-23 DIAGNOSIS — D7581 Myelofibrosis: Secondary | ICD-10-CM | POA: Diagnosis not present

## 2020-10-23 DIAGNOSIS — R161 Splenomegaly, not elsewhere classified: Secondary | ICD-10-CM | POA: Insufficient documentation

## 2020-10-23 DIAGNOSIS — D72829 Elevated white blood cell count, unspecified: Secondary | ICD-10-CM | POA: Insufficient documentation

## 2020-10-23 LAB — COMPREHENSIVE METABOLIC PANEL
ALT: 15 U/L (ref 0–44)
AST: 25 U/L (ref 15–41)
Albumin: 4.3 g/dL (ref 3.5–5.0)
Alkaline Phosphatase: 54 U/L (ref 38–126)
Anion gap: 9 (ref 5–15)
BUN: 16 mg/dL (ref 8–23)
CO2: 32 mmol/L (ref 22–32)
Calcium: 8.8 mg/dL — ABNORMAL LOW (ref 8.9–10.3)
Chloride: 100 mmol/L (ref 98–111)
Creatinine, Ser: 0.63 mg/dL (ref 0.44–1.00)
GFR, Estimated: >60 mL/min (ref >60)
Glucose, Bld: 100 mg/dL — ABNORMAL HIGH (ref 70–99)
Potassium: 3.4 mmol/L — ABNORMAL LOW (ref 3.5–5.1)
Sodium: 141 mmol/L (ref 135–145)
Total Bilirubin: 1 mg/dL (ref 0.3–1.2)
Total Protein: 7.2 g/dL (ref 6.5–8.1)

## 2020-10-23 LAB — CBC WITH DIFFERENTIAL/PLATELET
Abs Immature Granulocytes: 3.8 10*3/uL — ABNORMAL HIGH (ref 0.00–0.07)
Band Neutrophils: 6 %
Basophils Absolute: 0.6 10*3/uL — ABNORMAL HIGH (ref 0.0–0.1)
Basophils Relative: 4 %
Blasts: 1 %
Eosinophils Absolute: 0.3 10*3/uL (ref 0.0–0.5)
Eosinophils Relative: 2 %
HCT: 38.4 % (ref 36.0–46.0)
Hemoglobin: 11.5 g/dL — ABNORMAL LOW (ref 12.0–15.0)
Lymphocytes Relative: 8 %
Lymphs Abs: 1.3 10*3/uL (ref 0.7–4.0)
MCH: 29.9 pg (ref 26.0–34.0)
MCHC: 29.9 g/dL — ABNORMAL LOW (ref 30.0–36.0)
MCV: 100 fL (ref 80.0–100.0)
Metamyelocytes Relative: 10 %
Monocytes Absolute: 0.3 10*3/uL (ref 0.1–1.0)
Monocytes Relative: 2 %
Myelocytes: 14 %
Neutro Abs: 9.3 10*3/uL — ABNORMAL HIGH (ref 1.7–7.7)
Neutrophils Relative %: 53 %
Platelets: 218 10*3/uL (ref 150–400)
RBC: 3.84 MIL/uL — ABNORMAL LOW (ref 3.87–5.11)
RDW: 21.5 % — ABNORMAL HIGH (ref 11.5–15.5)
Smear Review: ADEQUATE
WBC: 15.8 10*3/uL — ABNORMAL HIGH (ref 4.0–10.5)
nRBC: 7.1 % — ABNORMAL HIGH (ref 0.0–0.2)

## 2020-10-23 LAB — LACTATE DEHYDROGENASE: LDH: 847 U/L — ABNORMAL HIGH (ref 98–192)

## 2020-10-23 MED ORDER — RUXOLITINIB PHOSPHATE 15 MG PO TABS: 15.0000 mg | ORAL_TABLET | Freq: Two times a day (BID) | ORAL | 3 refills | Status: AC

## 2020-10-23 NOTE — Progress Notes (Signed)
Pt here for follow up. Pt reports pain to right eye.

## 2020-10-23 NOTE — Progress Notes (Signed)
Hematology/Oncology Follow up note [facility] [phone] [phone]   Patient Care Team: Tracie Harrier, MD as PCP - General (Internal Medicine)  REFERRING PROVIDER: Tracie Harrier, MD  REASON FOR VISIT Follow up for treatment of myelofibrosis  HISTORY OF PRESENTING ILLNESS:  [name] is a  87 y.o.  female with PMH listed below who was referred to me for evaluation of leukocytosis and splenomegaly. Patient was recently admitted to outside facility [facility] in [facility] on Jun 12, 2017 after she developed rectal bleeding.  CT was done during that visit which showed a splenomegaly, spleen enlarged at 14 cm.  There is an indeterminant 1.5 cm right adrenal mass.  Rectal bleeding was considered to be secondary to diverticulitis. Patient follows up with Dr. Ginette Pitman primary care physician. On being 29 2019.  CBC showed white count 14.3, hemoglobin 9.9, MCV 99.4, immature granulocytes 1.2, reticulocyte percentage 7.78 elevated, platelet count 386,000, differential showed neutrophilia, ANC 9.93, basophils 0.47,  Reviewed patient's previous lab work-up.  She has had leukocytosis since October 2018 with similar differential abnormalities. Patient has also reported easy bruising, no episodes of acute bleeding.  She also had weight loss about 12 pounds for the past 2 to 3 months.  Reports feeling fatigued.   11/06/2017 Bone marrow biopsy aspirate showed hypercellular bone marrow with features of myeloproliferative neoplasm.  Discussed with Dr. Gari Crown All aspirate material is suboptimal but "biopsy touch imprint in the peripheral blood mostly show features of myeloproliferative neoplasm.  Particular the primary myelofibrosis especially in conjunction with patient's previously documented Jak 2 mutation.  No apparent increasing blast cells is seen.  An iron stain performed on touch imprints showed scattered offering centroblast.   Letter finding is of uncertain significance in this setting and may represent a dysplastic component.  Cytogenetics shows normal karyotype.  Foundation heme pending high molecular risk mutation status[ ASXL1, SRSF2, [id number], EZH2, IDH1/2] . GIPSS prognostic model: 1 point or more depending on foundation HEME results.  MIPSS70+ v2.0: She has constitutional symptoms which is 2 points, normal karyotype.  Status of high molecular risk mutational unknown Likely intermediate risk or higher.  # 12/06/2017 started on Jakafi 24m BID.   #Adrenal mass, no need for additional work-up.   12/04/2018 CT abdomen adrenal protocol showed benign bilateral adrenal adenoma.  Mild to moderate splenomegaly.  Chronic hiatal hernia and chronic compression fracture of L1. Coronary atherosclerosis.  Aortic atherosclerosis. Images were independently viewed by me and discussed with patient.   INTERVAL HISTORY Morgan SISKis a 87y.o. female who has above history reviewed by me today presents for follow up for primary myelofibrosis  Patient was accompanied by her daughter today. She reports doing well. Appetite is fair.    Review of Systems  Constitutional:  Negative for appetite change, chills, fatigue, fever and unexpected weight change.  HENT:   Negative for hearing loss and voice change.   Eyes:  Negative for eye problems.  Respiratory:  Negative for chest tightness and cough.   Cardiovascular:  Negative for chest pain and leg swelling.  Gastrointestinal:  Negative for abdominal distention, abdominal pain and blood in stool.  Endocrine: Negative for hot flashes.  Genitourinary:  Negative for difficulty urinating, dysuria and frequency.   Musculoskeletal:  Negative for arthralgias.  Skin:  Negative for itching and rash.  Neurological:  Negative for extremity weakness.  Hematological:  Negative for adenopathy. Bruises/bleeds easily.  Psychiatric/Behavioral:  Negative for confusion.    MEDICAL  HISTORY:  Past Medical History:  Diagnosis Date   Aneurysm of anterior cerebral artery    Blindness of left eye    secondary to cataract surgery complication   Depression    Hypertension    Myelofibrosis (HCC)    SAH (subarachnoid hemorrhage) ([place]) 02/2006   Tobacco abuse     SURGICAL HISTORY: Past Surgical History:  Procedure Laterality Date   ABDOMINAL HYSTERECTOMY     ANEURYSM COILING     CATARACT EXTRACTION     CHOLECYSTECTOMY      SOCIAL HISTORY: Social History   Socioeconomic History   Marital status: Widowed    Spouse name: Not on file   Number of children: 1   Years of education: Not on file   Highest education level: Not on file  Occupational History   Not on file  Tobacco Use   Smoking status: Former    Types: Cigarettes    Quit date: 07/31/2015    Years since quitting: 5.2   Smokeless tobacco: Never  Vaping Use   Vaping Use: Never used  Substance and Sexual Activity   Alcohol use: Yes    Alcohol/week: 1.0 standard drink    Types: 1 Glasses of wine per week    Comment: once a month   Drug use: No   Sexual activity: Not on file  Other Topics Concern   Not on file  Social History Narrative   Not on file   Social Determinants of Health   Financial Resource Strain: Not on file  Food Insecurity: Not on file  Transportation Needs: Not on file  Physical Activity: Not on file  Stress: Not on file  Social Connections: Not on file  Intimate Partner Violence: Not on file    FAMILY HISTORY: Family History  Problem Relation Age of Onset   Breast cancer Daughter 67   Breast cancer Sister    Throat cancer Mother    Lung cancer Father    Prostate cancer Brother    Leukemia Brother    Prostate cancer Brother    Leukemia Brother     ALLERGIES:  has No Known Allergies.  MEDICATIONS:  Current Outpatient Medications  Medication Sig Dispense Refill   amLODipine (NORVASC) 5 MG tablet Take 5 mg by mouth daily.     citalopram (CELEXA) 20 MG tablet  Take 20 mg by mouth daily.     diphenhydramine-acetaminophen (TYLENOL PM) 25-500 MG TABS tablet Take 1 tablet by mouth at bedtime as needed. Pt splits medication into 3     ferrous sulfate 325 (65 FE) MG tablet Take 325 mg by mouth once a week. Take 1 tablet on Sundays.     lisinopril (PRINIVIL,ZESTRIL) 40 MG tablet Take 40 mg by mouth daily.     omeprazole (PRILOSEC) 20 MG capsule Take 20 mg by mouth every morning.     Vitamin D, Ergocalciferol, (DRISDOL) 50000 units CAPS capsule Take 50,000 Units by mouth every Sunday.     ruxolitinib phosphate (JAKAFI) 15 MG tablet Take 1 tablet (15 mg total) by mouth 2 (two) times daily. 60 tablet 3   No current facility-administered medications for this visit.     PHYSICAL EXAMINATION: ECOG PERFORMANCE STATUS: 1 - Symptomatic but completely ambulatory Vitals:   10/23/20 1216  BP: (!) 169/96  Pulse: 60  Resp: 18  Temp: (!) 96.8 F (36 C)   Filed Weights   10/23/20 1216  Weight: 91 lb 8 oz (41.5 kg)    Physical Exam Constitutional:  General: She is not in acute distress.    Comments: Thin, walk independently.  HENT:     Head: Normocephalic and atraumatic.  Eyes:     General: No scleral icterus.    Conjunctiva/sclera: Conjunctivae normal.     Pupils: Pupils are equal, round, and reactive to light.  Cardiovascular:     Rate and Rhythm: Normal rate and regular rhythm.     Heart sounds: Normal heart sounds.  Pulmonary:     Effort: Pulmonary effort is normal. No respiratory distress.     Breath sounds: Normal breath sounds. No wheezing or rales.  Chest:     Chest wall: No tenderness.  Abdominal:     General: Bowel sounds are normal. There is no distension.     Palpations: Abdomen is soft. There is no mass.     Tenderness: There is no abdominal tenderness.     Comments: No splenomegaly.   Musculoskeletal:        General: No deformity. Normal range of motion.     Cervical back: Normal range of motion and neck supple.   Lymphadenopathy:     Cervical: No cervical adenopathy.  Skin:    General: Skin is warm and dry.     Findings: No erythema or rash.  Neurological:     Mental Status: She is alert and oriented to person, place, and time. Mental status is at baseline.     Cranial Nerves: No cranial nerve deficit.     Coordination: Coordination normal.  Psychiatric:        Mood and Affect: Mood normal.       LABORATORY DATA:  I have reviewed the data as listed Lab Results  Component Value Date   WBC 15.8 (H) 10/23/2020   HGB 11.5 (L) 10/23/2020   HCT 38.4 10/23/2020   MCV 100.0 10/23/2020   PLT 218 10/23/2020   Recent Labs    05/18/20 1352 07/24/20 1327 10/23/20 1147  NA 141 141 141  K 4.0 4.7 3.4*  CL 104 104 100  CO2 29 28 32  GLUCOSE 101* 100* 100*  BUN '15 20 16  ' CREATININE 0.85 0.79 0.63  CALCIUM 8.8* 9.4 8.8*  GFRNONAA >60 >60 >60  PROT 7.1 7.3 7.2  ALBUMIN 4.2 4.2 4.3  AST '31 28 25  ' ALT '18 15 15  ' ALKPHOS 55 51 54  BILITOT 1.1 0.5 1.0     Peripheral blood flow cytometry showed neutrophilia with left shift, less than 1% myeloblasts and basophilia.  CBC and a phenotypic findings suggest a myeloproliferative disorder   # BCR ABL FISH negative. # Jak 2 V617 mutation positive  RADIOGRAPHIC STUDIES: I have personally reviewed the radiological images as listed and agreed with the findings in the report. No results found.   ASSESSMENT & PLAN:  1. JAK-2 gene mutation   2. Myelofibrosis ([place])   3. Encounter for antineoplastic chemotherapy   4. Hypokalemia    # JAK 2 V617 mutation positive, myelofibrosis Labs are reviewed and discussed with patient. Stable counts.  Continue Jakafi 15 mg twice daily. Mild anemia, hemoglobin 11.5, stable.  Mld hypokalemia, recommend potassium rich food intake.   We spent sufficient time to discuss many aspect of care, questions were answered to patient and daughters satisfaction. Follow-up in 3 months.   Orders Placed This Encounter   Procedures   CBC with Differential/Platelet    Standing Status:   Future    Standing Expiration Date:   10/23/2021   Comprehensive metabolic panel  Standing Status:   Future    Standing Expiration Date:   10/23/2021   Lactate dehydrogenase    Standing Status:   Future    Standing Expiration Date:   10/23/2021    Earlie Server, MD, PhD Hematology Oncology [place] at [facility] 10/23/2020

## 2020-11-18 ENCOUNTER — Telehealth: Payer: Self-pay | Admitting: *Deleted

## 2020-11-18 NOTE — Telephone Encounter (Signed)
Pharmacy called reporting that were were unable to reach patient to set up delivery of her Jakafi, so they have cancelled the prescription.

## 2020-11-18 NOTE — Telephone Encounter (Signed)
Called and spoke to patient's daughter, [name], who states that patient has approx 1 month supply and does not need any at the moment.

## 2020-11-25 ENCOUNTER — Inpatient Hospital Stay
Admission: EM | Admit: 2020-11-25 | Discharge: 2020-12-08 | DRG: 871 | Disposition: E | Payer: Medicare Other | Attending: Internal Medicine | Admitting: Internal Medicine

## 2020-11-25 ENCOUNTER — Emergency Department: Payer: Medicare Other

## 2020-11-25 ENCOUNTER — Encounter: Payer: Self-pay | Admitting: Radiology

## 2020-11-25 ENCOUNTER — Other Ambulatory Visit: Payer: Self-pay

## 2020-11-25 DIAGNOSIS — J9622 Acute and chronic respiratory failure with hypercapnia: Secondary | ICD-10-CM | POA: Diagnosis present

## 2020-11-25 DIAGNOSIS — J189 Pneumonia, unspecified organism: Secondary | ICD-10-CM | POA: Diagnosis not present

## 2020-11-25 DIAGNOSIS — F32A Depression, unspecified: Secondary | ICD-10-CM | POA: Diagnosis present

## 2020-11-25 DIAGNOSIS — Z515 Encounter for palliative care: Secondary | ICD-10-CM

## 2020-11-25 DIAGNOSIS — J449 Chronic obstructive pulmonary disease, unspecified: Secondary | ICD-10-CM | POA: Diagnosis not present

## 2020-11-25 DIAGNOSIS — D638 Anemia in other chronic diseases classified elsewhere: Secondary | ICD-10-CM | POA: Diagnosis present

## 2020-11-25 DIAGNOSIS — J9621 Acute and chronic respiratory failure with hypoxia: Secondary | ICD-10-CM | POA: Diagnosis present

## 2020-11-25 DIAGNOSIS — G9341 Metabolic encephalopathy: Secondary | ICD-10-CM | POA: Diagnosis not present

## 2020-11-25 DIAGNOSIS — R918 Other nonspecific abnormal finding of lung field: Secondary | ICD-10-CM | POA: Diagnosis not present

## 2020-11-25 DIAGNOSIS — I5031 Acute diastolic (congestive) heart failure: Secondary | ICD-10-CM | POA: Diagnosis not present

## 2020-11-25 DIAGNOSIS — J690 Pneumonitis due to inhalation of food and vomit: Secondary | ICD-10-CM | POA: Diagnosis present

## 2020-11-25 DIAGNOSIS — R911 Solitary pulmonary nodule: Secondary | ICD-10-CM

## 2020-11-25 DIAGNOSIS — U071 COVID-19: Secondary | ICD-10-CM | POA: Diagnosis present

## 2020-11-25 DIAGNOSIS — E870 Hyperosmolality and hypernatremia: Secondary | ICD-10-CM | POA: Diagnosis not present

## 2020-11-25 DIAGNOSIS — R0602 Shortness of breath: Secondary | ICD-10-CM

## 2020-11-25 DIAGNOSIS — A4189 Other specified sepsis: Secondary | ICD-10-CM | POA: Diagnosis not present

## 2020-11-25 DIAGNOSIS — K219 Gastro-esophageal reflux disease without esophagitis: Secondary | ICD-10-CM | POA: Diagnosis present

## 2020-11-25 DIAGNOSIS — I2721 Secondary pulmonary arterial hypertension: Secondary | ICD-10-CM | POA: Diagnosis present

## 2020-11-25 DIAGNOSIS — D7581 Myelofibrosis: Secondary | ICD-10-CM | POA: Diagnosis present

## 2020-11-25 DIAGNOSIS — I5043 Acute on chronic combined systolic (congestive) and diastolic (congestive) heart failure: Secondary | ICD-10-CM | POA: Diagnosis not present

## 2020-11-25 DIAGNOSIS — F0150 Vascular dementia without behavioral disturbance: Secondary | ICD-10-CM | POA: Diagnosis not present

## 2020-11-25 DIAGNOSIS — J440 Chronic obstructive pulmonary disease with acute lower respiratory infection: Secondary | ICD-10-CM | POA: Diagnosis present

## 2020-11-25 DIAGNOSIS — Z808 Family history of malignant neoplasm of other organs or systems: Secondary | ICD-10-CM

## 2020-11-25 DIAGNOSIS — Z90710 Acquired absence of both cervix and uterus: Secondary | ICD-10-CM | POA: Diagnosis not present

## 2020-11-25 DIAGNOSIS — E876 Hypokalemia: Secondary | ICD-10-CM | POA: Diagnosis present

## 2020-11-25 DIAGNOSIS — Z806 Family history of leukemia: Secondary | ICD-10-CM

## 2020-11-25 DIAGNOSIS — E43 Unspecified severe protein-calorie malnutrition: Secondary | ICD-10-CM | POA: Diagnosis present

## 2020-11-25 DIAGNOSIS — H5462 Unqualified visual loss, left eye, normal vision right eye: Secondary | ICD-10-CM | POA: Diagnosis present

## 2020-11-25 DIAGNOSIS — D6959 Other secondary thrombocytopenia: Secondary | ICD-10-CM | POA: Diagnosis present

## 2020-11-25 DIAGNOSIS — Z801 Family history of malignant neoplasm of trachea, bronchus and lung: Secondary | ICD-10-CM

## 2020-11-25 DIAGNOSIS — F0153 Vascular dementia, unspecified severity, with mood disturbance: Secondary | ICD-10-CM | POA: Diagnosis present

## 2020-11-25 DIAGNOSIS — R296 Repeated falls: Secondary | ICD-10-CM | POA: Diagnosis present

## 2020-11-25 DIAGNOSIS — J441 Chronic obstructive pulmonary disease with (acute) exacerbation: Secondary | ICD-10-CM | POA: Diagnosis present

## 2020-11-25 DIAGNOSIS — Z66 Do not resuscitate: Secondary | ICD-10-CM | POA: Diagnosis not present

## 2020-11-25 DIAGNOSIS — Z7189 Other specified counseling: Secondary | ICD-10-CM | POA: Diagnosis not present

## 2020-11-25 DIAGNOSIS — I110 Hypertensive heart disease with heart failure: Secondary | ICD-10-CM | POA: Diagnosis present

## 2020-11-25 DIAGNOSIS — I480 Paroxysmal atrial fibrillation: Secondary | ICD-10-CM | POA: Diagnosis not present

## 2020-11-25 DIAGNOSIS — Z681 Body mass index (BMI) 19 or less, adult: Secondary | ICD-10-CM | POA: Diagnosis not present

## 2020-11-25 DIAGNOSIS — Z803 Family history of malignant neoplasm of breast: Secondary | ICD-10-CM

## 2020-11-25 DIAGNOSIS — I10 Essential (primary) hypertension: Secondary | ICD-10-CM | POA: Diagnosis present

## 2020-11-25 DIAGNOSIS — J1282 Pneumonia due to coronavirus disease 2019: Secondary | ICD-10-CM | POA: Diagnosis present

## 2020-11-25 DIAGNOSIS — Z87891 Personal history of nicotine dependence: Secondary | ICD-10-CM

## 2020-11-25 DIAGNOSIS — I5020 Unspecified systolic (congestive) heart failure: Secondary | ICD-10-CM

## 2020-11-25 DIAGNOSIS — R0902 Hypoxemia: Secondary | ICD-10-CM

## 2020-11-25 DIAGNOSIS — F0154 Vascular dementia, unspecified severity, with anxiety: Secondary | ICD-10-CM | POA: Diagnosis present

## 2020-11-25 DIAGNOSIS — D6489 Other specified anemias: Secondary | ICD-10-CM | POA: Diagnosis not present

## 2020-11-25 DIAGNOSIS — Z79899 Other long term (current) drug therapy: Secondary | ICD-10-CM

## 2020-11-25 LAB — CBC WITH DIFFERENTIAL/PLATELET
Abs Immature Granulocytes: 2.93 10*3/uL — ABNORMAL HIGH (ref 0.00–0.07)
Basophils Absolute: 0.1 10*3/uL (ref 0.0–0.1)
Basophils Relative: 1 %
Eosinophils Absolute: 0 10*3/uL (ref 0.0–0.5)
Eosinophils Relative: 0 %
HCT: 35.5 % — ABNORMAL LOW (ref 36.0–46.0)
Hemoglobin: 10.7 g/dL — ABNORMAL LOW (ref 12.0–15.0)
Immature Granulocytes: 18 %
Lymphocytes Relative: 3 %
Lymphs Abs: 0.4 10*3/uL — ABNORMAL LOW (ref 0.7–4.0)
MCH: 31.1 pg (ref 26.0–34.0)
MCHC: 30.1 g/dL (ref 30.0–36.0)
MCV: 103.2 fL — ABNORMAL HIGH (ref 80.0–100.0)
Monocytes Absolute: 1.9 10*3/uL — ABNORMAL HIGH (ref 0.1–1.0)
Monocytes Relative: 12 %
Neutro Abs: 10.8 10*3/uL — ABNORMAL HIGH (ref 1.7–7.7)
Neutrophils Relative %: 66 %
Platelets: 153 10*3/uL (ref 150–400)
RBC: 3.44 MIL/uL — ABNORMAL LOW (ref 3.87–5.11)
RDW: 22.3 % — ABNORMAL HIGH (ref 11.5–15.5)
Smear Review: NORMAL
WBC: 16.2 10*3/uL — ABNORMAL HIGH (ref 4.0–10.5)
nRBC: 28.1 % — ABNORMAL HIGH (ref 0.0–0.2)

## 2020-11-25 LAB — COMPREHENSIVE METABOLIC PANEL
ALT: 19 U/L (ref 0–44)
AST: 34 U/L (ref 15–41)
Albumin: 3.9 g/dL (ref 3.5–5.0)
Alkaline Phosphatase: 58 U/L (ref 38–126)
Anion gap: 8 (ref 5–15)
BUN: 19 mg/dL (ref 8–23)
CO2: 28 mmol/L (ref 22–32)
Calcium: 8.2 mg/dL — ABNORMAL LOW (ref 8.9–10.3)
Chloride: 105 mmol/L (ref 98–111)
Creatinine, Ser: 0.86 mg/dL (ref 0.44–1.00)
GFR, Estimated: >60 mL/min (ref >60)
Glucose, Bld: 117 mg/dL — ABNORMAL HIGH (ref 70–99)
Potassium: 3.4 mmol/L — ABNORMAL LOW (ref 3.5–5.1)
Sodium: 141 mmol/L (ref 135–145)
Total Bilirubin: 1.5 mg/dL — ABNORMAL HIGH (ref 0.3–1.2)
Total Protein: 6.8 g/dL (ref 6.5–8.1)

## 2020-11-25 LAB — RESP PANEL BY RT-PCR (FLU A&B, COVID) ARPGX2
Influenza A by PCR: NEGATIVE
Influenza B by PCR: NEGATIVE
SARS Coronavirus 2 by RT PCR: POSITIVE — AB

## 2020-11-25 LAB — PROCALCITONIN: Procalcitonin: 0.18 ng/mL

## 2020-11-25 LAB — TROPONIN I (HIGH SENSITIVITY)
Troponin I (High Sensitivity): 30 ng/L — ABNORMAL HIGH (ref <18)
Troponin I (High Sensitivity): 30 ng/L — ABNORMAL HIGH (ref <18)

## 2020-11-25 LAB — BRAIN NATRIURETIC PEPTIDE: B Natriuretic Peptide: 639.8 pg/mL — ABNORMAL HIGH (ref 0.0–100.0)

## 2020-11-25 MED ORDER — SODIUM CHLORIDE 0.9 % IV SOLN
500.0000 mg | INTRAVENOUS | Status: AC
  Administered 2020-11-25 – 2020-11-29 (×5): 500 mg via INTRAVENOUS
  Filled 2020-11-25 (×5): qty 500

## 2020-11-25 MED ORDER — REMDESIVIR 100 MG IV SOLR
100.0000 mg | Freq: Every day | INTRAVENOUS | Status: AC
Start: 2020-11-27 — End: 2020-11-30
  Administered 2020-11-27 – 2020-11-30 (×4): 100 mg via INTRAVENOUS
  Filled 2020-11-25: qty 100
  Filled 2020-11-25 (×3): qty 20
  Filled 2020-11-25: qty 100
  Filled 2020-11-25: qty 20

## 2020-11-25 MED ORDER — LACTATED RINGERS IV BOLUS (SEPSIS)
500.0000 mL | Freq: Once | INTRAVENOUS | Status: AC
  Administered 2020-11-25: 500 mL via INTRAVENOUS

## 2020-11-25 MED ORDER — LACTATED RINGERS IV SOLN: INTRAVENOUS | Status: AC

## 2020-11-25 MED ORDER — SODIUM CHLORIDE 0.9 % IV SOLN
200.0000 mg | Freq: Once | INTRAVENOUS | Status: AC
  Administered 2020-11-26: 200 mg via INTRAVENOUS
  Filled 2020-11-25: qty 200

## 2020-11-25 MED ORDER — CEFTRIAXONE SODIUM 2 G IJ SOLR
2.0000 g | INTRAMUSCULAR | Status: DC
Start: 2020-11-25 — End: 2020-11-26
  Administered 2020-11-25: 2 g via INTRAVENOUS
  Filled 2020-11-25: qty 20

## 2020-11-25 MED ORDER — IOHEXOL 300 MG/ML  SOLN
75.0000 mL | Freq: Once | INTRAMUSCULAR | Status: AC | PRN
  Administered 2020-11-25: 75 mL via INTRAVENOUS

## 2020-11-25 NOTE — ED Provider Notes (Addendum)
[facility] Emergency Department Provider Note  ____________________________________________   Event Date/Time   First MD Initiated Contact with Patient 11/21/2020 2054     (approximate)  I have reviewed the triage vital signs and the nursing notes.   HISTORY  Chief Complaint Shortness of Breath   HPI [name] is a 87 y.o. female who reports she is felt poorly and had had a cough and congestion since Sunday.  She is using laying in the bed not feeling well.  She is short of breath if she gets up and moves around.  She is coughing up whitish phlegm that is watery.  She has not had a fever.  She has not had any pain anywhere.  She noticed swelling in her legs starting Saturday or Sunday.         Past Medical History:  Diagnosis Date   Aneurysm of anterior cerebral artery    Blindness of left eye    secondary to cataract surgery complication   Depression    Hypertension    Myelofibrosis ([place])    SAH (subarachnoid hemorrhage) ([place]) 02/2006   Tobacco abuse     Patient Active Problem List   Diagnosis Date Noted   Elevated troponin 04/29/2020   Subdural hematoma 04/29/2020   Accidental fall 04/29/2020   Periorbital contusion, left, initial encounter 04/29/2020   Goals of care, counseling/discussion 03/24/2020   Weight loss 03/24/2020   Myelofibrosis ([place]) 02/25/2018   JAK-2 gene mutation 02/25/2018   HTN (hypertension) 02/25/2018   Encounter for antineoplastic chemotherapy 12/27/2017   Acute on chronic respiratory failure with hypoxia ([place]) 07/11/2016   COPD (chronic obstructive pulmonary disease) ([place]) 07/11/2016    Past Surgical History:  Procedure Laterality Date   ABDOMINAL HYSTERECTOMY     ANEURYSM COILING     CATARACT EXTRACTION     CHOLECYSTECTOMY      Prior to Admission medications   Medication Sig Start Date End Date Taking? Authorizing Provider  amLODipine (NORVASC) 5 MG tablet Take 5 mg by mouth daily. 12/29/17 10/23/20   [provider]  citalopram (CELEXA) 20 MG tablet Take 20 mg by mouth daily.    [provider]  diphenhydramine-acetaminophen (TYLENOL PM) 25-500 MG TABS tablet Take 1 tablet by mouth at bedtime as needed. Pt splits medication into 3    [provider]  ferrous sulfate 325 (65 FE) MG tablet Take 325 mg by mouth once a week. Take 1 tablet on Sundays.    [provider]  lisinopril (PRINIVIL,ZESTRIL) 40 MG tablet Take 40 mg by mouth daily. 06/30/16   [provider]  omeprazole (PRILOSEC) 20 MG capsule Take 20 mg by mouth every morning.    [provider]  ruxolitinib phosphate (JAKAFI) 15 MG tablet Take 1 tablet (15 mg total) by mouth 2 (two) times daily. 10/23/20   Earlie Server, MD  Vitamin D, Ergocalciferol, (DRISDOL) 50000 units CAPS capsule Take 50,000 Units by mouth every Sunday.    [provider]    Allergies Patient has no known allergies.  Family History  Problem Relation Age of Onset   Breast cancer Daughter 47   Breast cancer Sister    Throat cancer Mother    Lung cancer Father    Prostate cancer Brother    Leukemia Brother    Prostate cancer Brother    Leukemia Brother     Social History Social History   Tobacco Use   Smoking status: Former    Types: Cigarettes  Quit date: 07/31/2015    Years since quitting: 5.3   Smokeless tobacco: Never  Vaping Use   Vaping Use: Never used  Substance Use Topics   Alcohol use: Yes    Alcohol/week: 1.0 standard drink    Types: 1 Glasses of wine per week    Comment: once a month   Drug use: No    Review of Systems  Constitutional: No fever/chills Eyes: No visual changes. ENT: No sore throat. Cardiovascular: Denies chest pain. Respiratory: shortness of breath. Gastrointestinal: No abdominal pain.  No nausea, no vomiting.  No diarrhea.  No constipation. Genitourinary: Negative for dysuria. Musculoskeletal: Negative for back pain. Skin: Negative for  rash. Neurological: Negative for headaches, focal weakness   ____________________________________________   PHYSICAL EXAM:  VITAL SIGNS: ED Triage Vitals  Enc Vitals Group     BP 11/30/2020 1853 (!) 157/82     Pulse Rate 11/21/2020 1853 78     Resp 11/23/2020 1853 18     Temp 11/13/2020 1853 98.6 F (37 C)     Temp Source 11/08/2020 1853 Oral     SpO2 11/24/2020 1853 96 %     Weight 11/07/2020 1908 98 lb (44.5 kg)     Height 11/14/2020 1908 5' (1.524 m)     Head Circumference --      Peak Flow --      Pain Score 12/07/2020 1908 0     Pain Loc --      Pain Edu? --      Excl. in [place]? --     Constitutional: Alert and oriented. Well appearing and in no acute distress. Eyes: Conjunctivae are normal.  Head: Atraumatic. Nose: No congestion/rhinnorhea. Mouth/Throat: Mucous membranes are moist.  Oropharynx non-erythematous. Neck: No stridor. Cardiovascular: Normal rate, regular rhythm. Grossly normal heart sounds.  Good peripheral circulation. Respiratory: Normal respiratory effort.  No retractions. Lungs CTAB. Gastrointestinal: Soft and nontender. No distention. No abdominal bruits. Musculoskeletal: No lower extremity tenderness bilateral 2+ edema Neurologic:  Normal speech and language. No gross focal neurologic deficits are appreciated.  Skin:  Skin is warm, dry and intact. No rash noted.  Patient has venous stasis changes and bruising in both lower legs.   ____________________________________________   LABS (all labs ordered are listed, but only abnormal results are displayed)  Labs Reviewed  RESP PANEL BY RT-PCR (FLU A&B, COVID) ARPGX2 - Abnormal; Notable for the following components:      Result Value   SARS Coronavirus 2 by RT PCR POSITIVE (*)    All other components within normal limits  COMPREHENSIVE METABOLIC PANEL - Abnormal; Notable for the following components:   Potassium 3.4 (*)    Glucose, Bld 117 (*)    Calcium 8.2 (*)    Total Bilirubin 1.5 (*)    All other components  within normal limits  CBC WITH DIFFERENTIAL/PLATELET - Abnormal; Notable for the following components:   WBC 16.2 (*)    RBC 3.44 (*)    Hemoglobin 10.7 (*)    HCT 35.5 (*)    MCV 103.2 (*)    RDW 22.3 (*)    nRBC 28.1 (*)    Neutro Abs 10.8 (*)    Lymphs Abs 0.4 (*)    Monocytes Absolute 1.9 (*)    Abs Immature Granulocytes 2.93 (*)    All other components within normal limits  BRAIN NATRIURETIC PEPTIDE - Abnormal; Notable for the following components:   B Natriuretic Peptide 639.8 (*)    All other components within  normal limits  TROPONIN I (HIGH SENSITIVITY) - Abnormal; Notable for the following components:   Troponin I (High Sensitivity) 30 (*)    All other components within normal limits  TROPONIN I (HIGH SENSITIVITY) - Abnormal; Notable for the following components:   Troponin I (High Sensitivity) 30 (*)    All other components within normal limits  CULTURE, BLOOD (SINGLE)  PROCALCITONIN  PATHOLOGIST SMEAR REVIEW   ____________________________________________  EKG  EKG read interpreted by me shows normal sinus rhythm at 79 normal axis flipped T's in 2 through F and V2 through 4.  V2 through 4 is old to 48 and F are new.  Patient denies any chest pain. ____________________________________________  RADIOLOGY Gertha Calkin, personally viewed and evaluated these images (plain radiographs) as part of my medical decision making, as well as reviewing the written report by the radiologist.  ED MD interpretation: Chest x-ray read by me and radiologist.  There is a nodular opacity left upper lobe that radiology feels should get his contrasted CT.  There is patchy right lower lobe opacity which I feel looks more like a pneumonia especially in view of the patient's clinical history.  Official radiology report(s): DG Chest 2 View  Result Date: 11/29/2020 CLINICAL DATA:  Shortness of breath EXAM: CHEST - 2 VIEW COMPARISON:  04/28/2020 FINDINGS: Nodular opacity in the left  upper lobe, possibly associated with the anterior left 3rd rib. However, if pulmonary, this would be suspicious for an mass. Mild patchy right lower lobe opacity, chronic, likely atelectasis versus scarring. Trace left pleural effusion. The heart is top-normal in size.  Thoracic aortic atherosclerosis. IMPRESSION: Nodular opacity in the left upper lobe, possibly osseous, mass not excluded. Consider CT chest with contrast for further evaluation lobe. Mild patchy right lower lobe opacity, favoring atelectasis versus scarring. Trace left pleural effusion. Electronically Signed   By: Julian Hy M.D.   On: 12/07/2020 19:39   CT Chest W Contrast  Result Date: 11/29/2020 CLINICAL DATA:  Abnormal chest x-ray EXAM: CT CHEST WITH CONTRAST TECHNIQUE: Multidetector CT imaging of the chest was performed during intravenous contrast administration. CONTRAST:  22mL OMNIPAQUE IOHEXOL 300 MG/ML  SOLN COMPARISON:  Chest x-ray 11/28/2020, 07/11/2016, CT abdomen 12/04/2018 FINDINGS: Cardiovascular: Advanced aortic atherosclerosis. No aneurysmal dilatation. No dissection seen. Cardiomegaly. Coronary vascular calcification. No pericardial effusion Mediastinum/Nodes: Midline trachea. No thyroid mass. Nonspecific mediastinal lymph nodes measuring up to 8 mm in the precarinal space. Esophagus within normal limits. Lungs/Pleura: Emphysema. Small right greater than left pleural effusions. 3.2 by 2.7 by 2.2 cm spiculated left upper lobe lung mass. Smaller slightly spiculated right upper lobe lung nodule measuring 11 by 10 by 10 mm, series 3, image 63. Bilateral lower lobe bronchial wall thickening with mild mucous plugging in the right lower lobe. Patchy consolidation in the right lower lobe and right middle lobe. No visible pneumothorax. Upper Abdomen: Incompletely visualized cyst exophytic to the upper pole right kidney. Adrenal gland adenomas. Incompletely visualized spleen appears enlarged. Musculoskeletal: No acute osseous  abnormality. Chronic compression fracture at L1 IMPRESSION: 1. Spiculated left upper lobe 3.2 cm lung mass concerning for carcinoma. There is an additional smaller spiculated suspicious nodule in the right upper lobe measuring 11 mm. 2. Cardiomegaly with small right greater than left pleural effusions. Lower lobe bronchial wall thickening with mild mucous plugging in right lower lobe. Ground-glass density and patchy consolidation in the right lower lobe and right middle lobe potentially due to aspiration or pneumonia. 3. Emphysema 4. Incompletely visualized  splenomegaly.  Adrenal gland adenomas. Aortic Atherosclerosis (ICD10-I70.0) and Emphysema (ICD10-J43.9). Electronically Signed   By: Donavan Foil M.D.   On: 11/13/2020 22:09    ____________________________________________   PROCEDURES  Procedure(s) performed (including Critical Care): Critical care time 25 minutes.  This includes speaking to the patient and her son reviewing the labs and studies and ordering a chest CT reviewing that as well.  I then went back and discussed the findings with the patient additionally I have reviewed the patient in detail with the hospitalist.  Procedures   ____________________________________________   INITIAL IMPRESSION / [place] / ED COURSE  Patient has what appears to be new onset congestive heart failure.  Additionally she has a high white blood count and a procalcitonin which is elevated somewhat.  She is COVID-positive and may have a COVID-pneumonia and additionally seems to have a regular pneumonia with an elevated white count and of course the elevated procalcitonin.  She has 2 spiculated masses in her lungs which may be cancerous.  This patient is very complicated.  These problems all appear to be new.  I will begin addressing them and refer her to the hospitalist.             ____________________________________________   FINAL CLINICAL IMPRESSION(S) / ED DIAGNOSES  Final  diagnoses:  Hypoxia  SOB (shortness of breath)  Lung nodule  Systolic congestive heart failure, unspecified HF chronicity ([place])  Community acquired pneumonia, unspecified laterality  COVID     ED Discharge Orders     None        Note:  This document was prepared using Dragon voice recognition software and may include unintentional dictation errors.    Nena Polio, MD 12/07/2020 2306    Nena Polio, MD 11/10/2020 2306 ----------------------------------------- 11:13 PM on 11/24/2020 ----------------------------------------- Discussed patient with hospitalist at this time.   Nena Polio, MD 11/24/2020 [phone]

## 2020-11-25 NOTE — Progress Notes (Signed)
Remdesivir - Pharmacy Brief Note   O:  CXR: :"Nodular opacity in the left upper lobe, possibly osseous, mass not excluded. Consider CT chest with contrast for further evaluation lobe.  Mild patchy right lower lobe opacity, favoring atelectasis versus scarring. Trace left pleural effusion." SpO2: 79-100% on [place] 4L/min   A/P:  Remdesivir 200 mg IVPB once followed by 100 mg IVPB daily x 4 days.   Renda Rolls, PharmD, [facility] 11/23/2020 11:25 PM

## 2020-11-25 NOTE — Sepsis Progress Note (Signed)
Following per sepsis protocol   

## 2020-11-25 NOTE — Consult Note (Signed)
CODE SEPSIS - PHARMACY COMMUNICATION  **Broad Spectrum Antibiotics should be administered within 1 hour of Sepsis diagnosis**  Time Code Sepsis Called/Page Received: 2104  Antibiotics Ordered: ceftriaxone  Time of 1st antibiotic administration: 2129  Additional action taken by pharmacy: None   Oswald Hillock ,PharmD Clinical Pharmacist  12/07/2020  9:34 PM

## 2020-11-25 NOTE — ED Triage Notes (Addendum)
First nurse note: pt comes from [facility] with congestion, cough, SOB. [place] reports her RA sats were in the 60s but pt's fingers are VERY cold. Pt on 2L [place] and is 96%. HOH but speaking in complete sentences.

## 2020-11-25 NOTE — H&P (Signed)
History and Physical   [name] JOI:[id number] DOB: 08/06/1928 DOA: 11/20/2020  Referring MD/NP/PA: Dr. Rip Harbour  PCP: Tracie Harrier, MD   Patient coming from: Home  Chief Complaint: Shortness of breath and congestion  HPI: [name] is a 87 y.o. female with medical history significant of COPD, depression, myelofibrosis, history of subarachnoid hemorrhage, history of tobacco abuse, left eye blindness, hard of hearing, history of anterior cerebral artery aneurysm, essential hypertension among other things who was apparently sent over from [place] [facility] with congestion, of shortness of breath.  She was initially assisted with oxygen sats in the 60s.  After warming up in the ER her oxygen sat was 79% on room air.  Patient placed on oxygen.  She is talking in complete sentences but seem to have exertional dyspnea.  Associated with some fever.  Patient also has some basal crackles with some mild pedal edema.  Work-up showed that patient meets sepsis criteria with a white count of 16.2.  Some tachycardia as well as lactic acid elevated.  She also has hypoxia with oxygen sats 79% on room air.  She has elevated BNP which is new with findings suggestive of congestive heart failure but more importantly patient was found to have COVID-19 positive results with new lung lesions concerning for new malignancy.  She is therefore being admitted to the hospital for further evaluation and treatment.  ED Course: Temperature is 98.6, blood pressure 178/67, pulse 59, respirate of 19 oxygen sats 79% on room air.  White count 16.2 hemoglobin 10.7 platelets 153.  Sodium 141 potassium 3.4 chloride 105 CO2 is 28 BUN 19 creatinine 0.86 and calcium 8.2.  Troponin is flat at 30.  BNP of 639.  Procalcitonin 0.18.  COVID-19 screen is positive but influenza negative.  Urinalysis currently pending.  Chest x-ray showed nodular opacity in the left upper lobe possibly mass versus mild patchy right lower lobe  opacity also trace pleural effusion.  CT chest with contrast showed spiculated left upper lobe 3.2 cm lung mass concerning for carcinoma.  Also additional smaller 1 in the right upper lobe about 11 mm.  There is cardiomegaly with small right greater than left pleural effusions.  Groundglass density and patchy consolidation in the right lower lobe and right middle lobe possibly aspiration pneumonia.  Absent emphysema.  Patient being admitted therefore with sepsis due to COVID-19 infection but also pneumonia most likely aspiration pneumonia.  Review of Systems: As per HPI otherwise 10 point review of systems negative.    Past Medical History:  Diagnosis Date   Aneurysm of anterior cerebral artery    Blindness of left eye    secondary to cataract surgery complication   Depression    Hypertension    Myelofibrosis (HCC)    SAH (subarachnoid hemorrhage) ([place]) 02/2006   Tobacco abuse     Past Surgical History:  Procedure Laterality Date   ABDOMINAL HYSTERECTOMY     ANEURYSM COILING     CATARACT EXTRACTION     CHOLECYSTECTOMY       reports that she quit smoking about 5 years ago. She has never used smokeless tobacco. She reports current alcohol use of about 1.0 standard drink per week. She reports that she does not use drugs.  No Known Allergies  Family History  Problem Relation Age of Onset   Breast cancer Daughter 8   Breast cancer Sister    Throat cancer Mother    Lung cancer Father    Prostate cancer Brother  Leukemia Brother    Prostate cancer Brother    Leukemia Brother      Prior to Admission medications   Medication Sig Start Date End Date Taking? Authorizing Provider  amLODipine (NORVASC) 5 MG tablet Take 5 mg by mouth daily. 12/29/17 10/23/20  [provider]  citalopram (CELEXA) 20 MG tablet Take 20 mg by mouth daily.    [provider]  diphenhydramine-acetaminophen (TYLENOL PM) 25-500 MG TABS tablet Take 1 tablet by mouth at bedtime as needed. Pt  splits medication into 3    [provider]  ferrous sulfate 325 (65 FE) MG tablet Take 325 mg by mouth once a week. Take 1 tablet on Sundays.    [provider]  lisinopril (PRINIVIL,ZESTRIL) 40 MG tablet Take 40 mg by mouth daily. 06/30/16   [provider]  omeprazole (PRILOSEC) 20 MG capsule Take 20 mg by mouth every morning.    [provider]  ruxolitinib phosphate (JAKAFI) 15 MG tablet Take 1 tablet (15 mg total) by mouth 2 (two) times daily. 10/23/20   Earlie Server, MD  Vitamin D, Ergocalciferol, (DRISDOL) 50000 units CAPS capsule Take 50,000 Units by mouth every Sunday.    [provider]    Physical Exam: Vitals:   12/03/2020 2155 11/22/2020 2200 11/12/2020 2230 11/19/2020 2300  BP:  (!) 167/75 (!) 167/65 (!) 156/56  Pulse:  74 66 66  Resp:    19  Temp:      TempSrc:      SpO2: 99% 98% 100% 100%  Weight:      Height:          Constitutional: Frail, hard of hearing, no obvious distress Vitals:   11/09/2020 2155 11/24/2020 2200 12/01/2020 2230 11/10/2020 2300  BP:  (!) 167/75 (!) 167/65 (!) 156/56  Pulse:  74 66 66  Resp:    19  Temp:      TempSrc:      SpO2: 99% 98% 100% 100%  Weight:      Height:       Eyes: PERRL, lids and conjunctivae normal ENMT: Mucous membranes are dry. Posterior pharynx clear of any exudate or lesions.Normal dentition.  Neck: normal, supple, no masses, no thyromegaly Respiratory: Decreased air entry bilaterally with marked expiratory wheezing, crackles and rales, normal respiratory effort. No accessory muscle use.  Cardiovascular: Regular rate and rhythm, no murmurs / rubs / gallops. No extremity edema. 2+ pedal pulses. No carotid bruits.  Abdomen: no tenderness, no masses palpated. No hepatosplenomegaly. Bowel sounds positive.  Musculoskeletal: no clubbing / cyanosis. No joint deformity upper and lower extremities. Good ROM, no contractures. Normal muscle tone.  Skin: no rashes, lesions, ulcers. No  induration Neurologic: CN 2-12 grossly intact. Sensation intact, DTR normal. Strength 5/5 in all 4.  Psychiatric: Mildly confused   Labs on Admission: I have personally reviewed following labs and imaging studies  CBC: Recent Labs  Lab 12/06/2020 1911  WBC 16.2*  NEUTROABS 10.8*  HGB 10.7*  HCT 35.5*  MCV 103.2*  PLT 469   Basic Metabolic Panel: Recent Labs  Lab 11/17/2020 1911  NA 141  K 3.4*  CL 105  CO2 28  GLUCOSE 117*  BUN 19  CREATININE 0.86  CALCIUM 8.2*   GFR: Estimated Creatinine Clearance: 30.5 mL/min (by C-G formula based on SCr of 0.86 mg/dL). Liver Function Tests: Recent Labs  Lab 11/19/2020 1911  AST 34  ALT 19  ALKPHOS 58  BILITOT 1.5*  PROT 6.8  ALBUMIN 3.9  No results for input(s): LIPASE, AMYLASE in the last 168 hours. No results for input(s): AMMONIA in the last 168 hours. Coagulation Profile: No results for input(s): INR, PROTIME in the last 168 hours. Cardiac Enzymes: No results for input(s): CKTOTAL, CKMB, CKMBINDEX, TROPONINI in the last 168 hours. BNP (last 3 results) No results for input(s): PROBNP in the last 8760 hours. HbA1C: No results for input(s): HGBA1C in the last 72 hours. CBG: No results for input(s): GLUCAP in the last 168 hours. Lipid Profile: No results for input(s): CHOL, HDL, LDLCALC, TRIG, CHOLHDL, LDLDIRECT in the last 72 hours. Thyroid Function Tests: No results for input(s): TSH, T4TOTAL, FREET4, T3FREE, THYROIDAB in the last 72 hours. Anemia Panel: No results for input(s): VITAMINB12, FOLATE, FERRITIN, TIBC, IRON, RETICCTPCT in the last 72 hours. Urine analysis:    Component Value Date/Time   COLORURINE YELLOW (A) 06/04/2019 1346   APPEARANCEUR TURBID (A) 06/04/2019 1346   LABSPEC 1.013 06/04/2019 1346   PHURINE 6.0 06/04/2019 1346   GLUCOSEU NEGATIVE 06/04/2019 1346   HGBUR SMALL (A) 06/04/2019 1346   BILIRUBINUR NEGATIVE 06/04/2019 1346   KETONESUR NEGATIVE 06/04/2019 1346   PROTEINUR 100 (A) 06/04/2019  1346   NITRITE POSITIVE (A) 06/04/2019 1346   LEUKOCYTESUR LARGE (A) 06/04/2019 1346   Sepsis Labs: @LABRCNTIP (procalcitonin:4,lacticidven:4) ) Recent Results (from the past 240 hour(s))  Resp Panel by RT-PCR (Flu A&B, Covid) Nasopharyngeal Swab     Status: Abnormal   Collection Time: 11/29/2020  9:07 PM   Specimen: Nasopharyngeal Swab; Nasopharyngeal(NP) swabs in vial transport medium  Result Value Ref Range Status   SARS Coronavirus 2 by RT PCR POSITIVE (A) NEGATIVE Final    Comment: RESULT CALLED TO, READ BACK BY AND VERIFIED WITH: MICHELE ROMERO@[id number]  11/20/2020 RH (NOTE) SARS-CoV-2 target nucleic acids are DETECTED.  The SARS-CoV-2 RNA is generally detectable in upper respiratory specimens during the acute phase of infection. Positive results are indicative of the presence of the identified virus, but do not rule out bacterial infection or co-infection with other pathogens not detected by the test. Clinical correlation with patient history and other diagnostic information is necessary to determine patient infection status. The expected result is Negative.  Fact Sheet for Patients: EntrepreneurPulse.com.au  Fact Sheet for Healthcare Providers: IncredibleEmployment.be  This test is not yet approved or cleared by the [place] FDA and  has been authorized for detection and/or diagnosis of SARS-CoV-2 by FDA under an Emergency Use Authorization (EUA).  This EUA will remain in effect (meaning this test can be u sed) for the duration of  the COVID-19 declaration under Section 564(b)(1) of the Act, 21 U.S.C. section 360bbb-3(b)(1), unless the authorization is terminated or revoked sooner.     Influenza A by PCR NEGATIVE NEGATIVE Final   Influenza B by PCR NEGATIVE NEGATIVE Final    Comment: (NOTE) The Xpert Xpress SARS-CoV-2/FLU/RSV plus assay is intended as an aid in the diagnosis of influenza from Nasopharyngeal swab specimens and should  not be used as a sole basis for treatment. Nasal washings and aspirates are unacceptable for Xpert Xpress SARS-CoV-2/FLU/RSV testing.  Fact Sheet for Patients: EntrepreneurPulse.com.au  Fact Sheet for Healthcare Providers: IncredibleEmployment.be  This test is not yet approved or cleared by the [place] FDA and has been authorized for detection and/or diagnosis of SARS-CoV-2 by FDA under an Emergency Use Authorization (EUA). This EUA will remain in effect (meaning this test can be used) for the duration of the COVID-19 declaration under Section 564(b)(1) of the Act, 21 U.S.C. section  360bbb-3(b)(1), unless the authorization is terminated or revoked.  Performed at [facility], [address] [place], [place] [address]      Radiological Exams on Admission: DG Chest 2 View  Result Date: 11/30/2020 CLINICAL DATA:  Shortness of breath EXAM: CHEST - 2 VIEW COMPARISON:  04/28/2020 FINDINGS: Nodular opacity in the left upper lobe, possibly associated with the anterior left 3rd rib. However, if pulmonary, this would be suspicious for an mass. Mild patchy right lower lobe opacity, chronic, likely atelectasis versus scarring. Trace left pleural effusion. The heart is top-normal in size.  Thoracic aortic atherosclerosis. IMPRESSION: Nodular opacity in the left upper lobe, possibly osseous, mass not excluded. Consider CT chest with contrast for further evaluation lobe. Mild patchy right lower lobe opacity, favoring atelectasis versus scarring. Trace left pleural effusion. Electronically Signed   By: Julian Hy M.D.   On: 11/17/2020 19:39   CT Chest W Contrast  Result Date: 11/13/2020 CLINICAL DATA:  Abnormal chest x-ray EXAM: CT CHEST WITH CONTRAST TECHNIQUE: Multidetector CT imaging of the chest was performed during intravenous contrast administration. CONTRAST:  58mL OMNIPAQUE IOHEXOL 300 MG/ML  SOLN COMPARISON:  Chest x-ray 11/22/2020,  07/11/2016, CT abdomen 12/04/2018 FINDINGS: Cardiovascular: Advanced aortic atherosclerosis. No aneurysmal dilatation. No dissection seen. Cardiomegaly. Coronary vascular calcification. No pericardial effusion Mediastinum/Nodes: Midline trachea. No thyroid mass. Nonspecific mediastinal lymph nodes measuring up to 8 mm in the precarinal space. Esophagus within normal limits. Lungs/Pleura: Emphysema. Small right greater than left pleural effusions. 3.2 by 2.7 by 2.2 cm spiculated left upper lobe lung mass. Smaller slightly spiculated right upper lobe lung nodule measuring 11 by 10 by 10 mm, series 3, image 63. Bilateral lower lobe bronchial wall thickening with mild mucous plugging in the right lower lobe. Patchy consolidation in the right lower lobe and right middle lobe. No visible pneumothorax. Upper Abdomen: Incompletely visualized cyst exophytic to the upper pole right kidney. Adrenal gland adenomas. Incompletely visualized spleen appears enlarged. Musculoskeletal: No acute osseous abnormality. Chronic compression fracture at L1 IMPRESSION: 1. Spiculated left upper lobe 3.2 cm lung mass concerning for carcinoma. There is an additional smaller spiculated suspicious nodule in the right upper lobe measuring 11 mm. 2. Cardiomegaly with small right greater than left pleural effusions. Lower lobe bronchial wall thickening with mild mucous plugging in right lower lobe. Ground-glass density and patchy consolidation in the right lower lobe and right middle lobe potentially due to aspiration or pneumonia. 3. Emphysema 4. Incompletely visualized splenomegaly.  Adrenal gland adenomas. Aortic Atherosclerosis (ICD10-I70.0) and Emphysema (ICD10-J43.9). Electronically Signed   By: Donavan Foil M.D.   On: 11/16/2020 22:09      Assessment/Plan Principal Problem:   Sepsis due to COVID-19 [facility]) Active Problems:   Acute on chronic respiratory failure with hypoxia (HCC)   COPD (chronic obstructive pulmonary disease) (HCC)    Myelofibrosis (HCC)   HTN (hypertension)   Depression   Vascular dementia without behavioral disturbance (HCC)   Pneumonia due to COVID-19 virus   Acute diastolic CHF (congestive heart failure) ([place])   Hypokalemia   Lung mass     #1 sepsis due to COVID-19 pneumonia: Patient has frequent falls secondary pneumonia most likely aspiration.  Admit the patient.  She is a full code so we will treat her for the [place].  All of her remdesivir with dexamethasone.  Empiric antibiotics for secondary bacterial pneumonia especially with suggestion for aspiration pneumonia.  Monitor labs daily.  Keep on oxygen until hypoxia resolves.  #2 new lung masses: Suspicious  for cancer.  Patient is 87 years old but still on treatment for myelofibrosis.  Defer to oncology.  #3 myelofibrosis: Actively being treated on chemo.  Again deferred to her oncologist.  #4 acute on chronic respiratory failure with pneumonia: Continue antibiotics.  #5 essential hypertension: Confirm and resume home regimen.  #6 hypokalemia: Replete potassium.  #7 COPD with mild exacerbation: Secondary to acute infection.  Continue with steroids and breathing treatments  #8 vascular dementia: At baseline.  #9 new onset congestive heart failure: Suspected diastolic dysfunction.  We will get echocardiogram we will monitor  #10 depression with anxiety: Confirm and resume home regimen  #11 normocytic anemia: Probably chronic disease.  Continue to monitor   DVT prophylaxis: Lovenox Code Status: Full code Family Communication: Family over the phone Disposition Plan: To be determined Consults called: None but will need pulmonary consult for lung mass Admission status: Inpatient  Severity of Illness: The appropriate patient status for this patient is INPATIENT. Inpatient status is judged to be reasonable and necessary in order to provide the required intensity of service to ensure the patient's safety. The patient's presenting symptoms,  physical exam findings, and initial radiographic and laboratory data in the context of their chronic comorbidities is felt to place them at high risk for further clinical deterioration. Furthermore, it is not anticipated that the patient will be medically stable for discharge from the hospital within 2 midnights of admission.   * I certify that at the point of admission it is my clinical judgment that the patient will require inpatient hospital care spanning beyond 2 midnights from the point of admission due to high intensity of service, high risk for further deterioration and high frequency of surveillance required.Barbette Merino MD [facility] Pager [phone]  If 7PM-7AM, please contact night-coverage [email or web address] Password [email or web address]  11/21/2020, 11:22 PM

## 2020-11-25 NOTE — Sepsis Progress Note (Addendum)
Notified provider of need to document reason to not give fluid bolus.  Also Notified bedside nurse of need to order and draw lactic acid.  Notified provider of need to order lactic acid and fluid bolus.

## 2020-11-25 NOTE — ED Notes (Signed)
Pt assisted to toilet then back to bed. No additional needs verbalized at this time. Bed low & locked; call light & personal items within reach.

## 2020-11-25 NOTE — ED Triage Notes (Signed)
Per family pt with shob, congestion, and "lying around in bed for a few days". Pt denies pain, vomiting, fever. Pt does not wear oxygen chronically. Pt is on 2lpm [place] currently.

## 2020-11-25 NOTE — ED Notes (Signed)
COVID +; MD aware

## 2020-11-26 ENCOUNTER — Encounter: Payer: Self-pay | Admitting: Internal Medicine

## 2020-11-26 ENCOUNTER — Inpatient Hospital Stay (HOSPITAL_COMMUNITY)
Admit: 2020-11-26 | Discharge: 2020-11-26 | Disposition: A | Discharge status: Home / Self Care | Payer: Medicare Other | Attending: Internal Medicine | Admitting: Internal Medicine

## 2020-11-26 DIAGNOSIS — I5031 Acute diastolic (congestive) heart failure: Secondary | ICD-10-CM

## 2020-11-26 DIAGNOSIS — U071 COVID-19: Secondary | ICD-10-CM | POA: Diagnosis not present

## 2020-11-26 DIAGNOSIS — A4189 Other specified sepsis: Secondary | ICD-10-CM | POA: Diagnosis not present

## 2020-11-26 LAB — ECHOCARDIOGRAM COMPLETE
AR max vel: 3.92 cm2
AV Area VTI: 4.11 cm2
AV Area mean vel: 4.63 cm2
AV Mean grad: 3 mmHg
AV Peak grad: 6.7 mmHg
Ao pk vel: 1.29 m/s
Height: 60 in
P 1/2 time: 414 msec
S' Lateral: 2.9 cm
Weight: 1568 oz

## 2020-11-26 LAB — CBC
HCT: 31 % — ABNORMAL LOW (ref 36.0–46.0)
Hemoglobin: 9.3 g/dL — ABNORMAL LOW (ref 12.0–15.0)
MCH: 31.1 pg (ref 26.0–34.0)
MCHC: 30 g/dL (ref 30.0–36.0)
MCV: 103.7 fL — ABNORMAL HIGH (ref 80.0–100.0)
Platelets: 136 10*3/uL — ABNORMAL LOW (ref 150–400)
RBC: 2.99 MIL/uL — ABNORMAL LOW (ref 3.87–5.11)
RDW: 22.4 % — ABNORMAL HIGH (ref 11.5–15.5)
WBC: 9.3 10*3/uL (ref 4.0–10.5)
nRBC: 18.8 % — ABNORMAL HIGH (ref 0.0–0.2)

## 2020-11-26 LAB — CREATININE, SERUM
Creatinine, Ser: 0.71 mg/dL (ref 0.44–1.00)
GFR, Estimated: >60 mL/min (ref >60)

## 2020-11-26 LAB — LACTIC ACID, PLASMA: Lactic Acid, Venous: 0.7 mmol/L (ref 0.5–1.9)

## 2020-11-26 LAB — PATHOLOGIST SMEAR REVIEW

## 2020-11-26 MED ORDER — PANTOPRAZOLE SODIUM 40 MG PO TBEC
40.0000 mg | DELAYED_RELEASE_TABLET | Freq: Every day | ORAL | Status: DC
  Administered 2020-11-26 – 2020-11-30 (×5): 40 mg via ORAL
  Filled 2020-11-26 (×6): qty 1

## 2020-11-26 MED ORDER — SODIUM CHLORIDE 0.9 % IV SOLN
1.0000 g | INTRAVENOUS | Status: AC
  Administered 2020-11-27 – 2020-11-30 (×4): 1 g via INTRAVENOUS
  Filled 2020-11-26 (×2): qty 1
  Filled 2020-11-26 (×2): qty 10

## 2020-11-26 MED ORDER — SODIUM CHLORIDE 0.9 % IV SOLN: 100.0000 mg | Freq: Every day | INTRAVENOUS | Status: DC

## 2020-11-26 MED ORDER — HYDROCOD POLST-CPM POLST ER 10-8 MG/5ML PO SUER
5.0000 mL | Freq: Two times a day (BID) | ORAL | Status: DC | PRN
Start: 2020-11-26 — End: 2020-12-03
  Administered 2020-11-26 – 2020-11-27 (×3): 5 mL via ORAL
  Filled 2020-11-26 (×3): qty 5

## 2020-11-26 MED ORDER — IPRATROPIUM-ALBUTEROL 20-100 MCG/ACT IN AERS
1.0000 | INHALATION_SPRAY | Freq: Four times a day (QID) | RESPIRATORY_TRACT | Status: DC
  Administered 2020-11-26 – 2020-12-02 (×16): 1 via RESPIRATORY_TRACT
  Filled 2020-11-26: qty 4

## 2020-11-26 MED ORDER — SODIUM CHLORIDE 0.9 % IV SOLN: 200.0000 mg | Freq: Once | INTRAVENOUS | Status: DC

## 2020-11-26 MED ORDER — POTASSIUM CHLORIDE CRYS ER 20 MEQ PO TBCR
20.0000 meq | EXTENDED_RELEASE_TABLET | Freq: Once | ORAL | Status: AC
  Administered 2020-11-26: 20 meq via ORAL
  Filled 2020-11-26: qty 1

## 2020-11-26 MED ORDER — LISINOPRIL 5 MG PO TABS
30.0000 mg | ORAL_TABLET | Freq: Every day | ORAL | Status: DC
  Administered 2020-11-26 – 2020-11-30 (×5): 30 mg via ORAL
  Filled 2020-11-26 (×6): qty 1

## 2020-11-26 MED ORDER — AMLODIPINE BESYLATE 5 MG PO TABS
5.0000 mg | ORAL_TABLET | Freq: Every day | ORAL | Status: DC
  Administered 2020-11-26 – 2020-11-30 (×5): 5 mg via ORAL
  Filled 2020-11-26 (×6): qty 1

## 2020-11-26 MED ORDER — ENOXAPARIN SODIUM 30 MG/0.3ML IJ SOSY
30.0000 mg | PREFILLED_SYRINGE | INTRAMUSCULAR | Status: DC
  Administered 2020-11-26 – 2020-12-02 (×7): 30 mg via SUBCUTANEOUS
  Filled 2020-11-26 (×7): qty 0.3

## 2020-11-26 MED ORDER — SODIUM CHLORIDE 0.9 % IV SOLN: 500.0000 mg | INTRAVENOUS | Status: DC

## 2020-11-26 MED ORDER — ONDANSETRON HCL 4 MG/2ML IJ SOLN: 4.0000 mg | Freq: Four times a day (QID) | INTRAMUSCULAR | Status: DC | PRN

## 2020-11-26 MED ORDER — CITALOPRAM HYDROBROMIDE 20 MG PO TABS
20.0000 mg | ORAL_TABLET | Freq: Every day | ORAL | Status: DC
  Administered 2020-11-26 – 2020-11-30 (×5): 20 mg via ORAL
  Filled 2020-11-26 (×6): qty 1

## 2020-11-26 MED ORDER — DEXAMETHASONE SODIUM PHOSPHATE 10 MG/ML IJ SOLN
6.0000 mg | INTRAMUSCULAR | Status: DC
  Administered 2020-11-26 – 2020-12-02 (×7): 6 mg via INTRAVENOUS
  Filled 2020-11-26 (×2): qty 1
  Filled 2020-11-26 (×2): qty 0.6
  Filled 2020-11-26: qty 1
  Filled 2020-11-26 (×2): qty 0.6

## 2020-11-26 MED ORDER — ONDANSETRON HCL 4 MG PO TABS: 4.0000 mg | ORAL_TABLET | Freq: Four times a day (QID) | ORAL | Status: DC | PRN

## 2020-11-26 MED ORDER — GUAIFENESIN-DM 100-10 MG/5ML PO SYRP
10.0000 mL | ORAL_SOLUTION | ORAL | Status: DC | PRN
  Filled 2020-11-26: qty 10

## 2020-11-26 MED ORDER — SODIUM CHLORIDE 0.9 % IV SOLN: 1.0000 g | INTRAVENOUS | Status: DC

## 2020-11-26 NOTE — ED Notes (Addendum)
Pt IV pulled out and pt saturated in blood. This RN and Teacher, English as a foreign language washed pt off, changed gown, linen and brief. Pt in NAD.

## 2020-11-26 NOTE — Progress Notes (Signed)
*  PRELIMINARY RESULTS* Echocardiogram 2D Echocardiogram has been performed.  [name] 11/26/2020, 10:47 AM

## 2020-11-26 NOTE — Consult Note (Signed)
Pulmonary Medicine          Date: 11/26/2020,   MRN# [id number] [name] 05-Aug-1928     AdmissionWeight: 44.5 kg                 CurrentWeight: 44.5 kg    Referrng physician: Dr Arbutus Ped  CHIEF COMPLAINT:   Mass of left lung   HISTORY OF PRESENT ILLNESS   [name] is a 87 y.o. female with medical history significant of COPD, depression, myelofibrosis, history of subarachnoid hemorrhage, history of tobacco abuse, left eye blindness, hard of hearing, history of anterior cerebral artery aneurysm, essential hypertension among other things who was apparently sent over from [place] [facility] with congestion, of shortness of breath.  She was found to have COVID19 + She was initially assisted with oxygen sats in the 60s.  After warming up in the ER her oxygen sat was 79% on room air.  Patient placed on oxygen.  She is talking in complete sentences but seem to have exertional dyspnea.  Associated with some fever.  Patient also has some basal crackles with some mild pedal edema.  Work-up showed that patient meets sepsis criteria with a white count of 16.2.  Some tachycardia as well as lactic acid elevated.  She also has hypoxia with oxygen sats 79% on room air.   Temperature is 98.6, blood pressure 178/67, pulse 59, respirate of 19 oxygen sats 79% on room air.  White count 16.2 hemoglobin 10.7 platelets 153.  Sodium 141 potassium 3.4 chloride 105 CO2 is 28 BUN 19 creatinine 0.86 and calcium 8.2.  Troponin is flat at 30.  BNP of 639.  Procalcitonin 0.18.  COVID-19 screen is positive but influenza negative.  Urinalysis currently pending.  Chest x-ray showed nodular opacity in the left upper lobe possibly mass versus mild patchy right lower lobe opacity also trace pleural effusion.  CT chest with contrast showed spiculated left upper lobe 3.2 cm lung mass concerning for carcinoma.  Also additional smaller 1 in the right upper lobe about 11 mm.  There is cardiomegaly with small  right greater than left pleural effusions.  Groundglass density and patchy consolidation in the right lower lobe and right middle lobe possibly aspiration pneumonia.  Absent emphysema.  PCCM consultation for additional guidance regarding lung mass. I met with brother of patient and we discussed findings together with patient. We discussed that its probably safest to have full scope of therapy for COVID prior to biopsy of any kind.      PAST MEDICAL HISTORY   Past Medical History:  Diagnosis Date   Aneurysm of anterior cerebral artery    Blindness of left eye    secondary to cataract surgery complication   Depression    Hypertension    Myelofibrosis (HCC)    SAH (subarachnoid hemorrhage) ([place]) 02/2006   Tobacco abuse      SURGICAL HISTORY   Past Surgical History:  Procedure Laterality Date   ABDOMINAL HYSTERECTOMY     ANEURYSM COILING     CATARACT EXTRACTION     CHOLECYSTECTOMY       FAMILY HISTORY   Family History  Problem Relation Age of Onset   Breast cancer Daughter 77   Breast cancer Sister    Throat cancer Mother    Lung cancer Father    Prostate cancer Brother    Leukemia Brother    Prostate cancer Brother    Leukemia Brother      SOCIAL HISTORY  Social History   Tobacco Use   Smoking status: Former    Types: Cigarettes    Quit date: 07/31/2015    Years since quitting: 5.3   Smokeless tobacco: Never  Vaping Use   Vaping Use: Never used  Substance Use Topics   Alcohol use: Yes    Alcohol/week: 1.0 standard drink    Types: 1 Glasses of wine per week    Comment: once a month   Drug use: No     MEDICATIONS    Home Medication:  Current Outpatient Rx   Order #: [id number] Class: Historical Med   Order #: [id number] Class: Historical Med   Order #: [id number] Class: Historical Med   Order #: [id number] Class: Historical Med   Order #: [id number] Class: Historical Med   Order #: [id number] Class: Historical Med   Order #: [id number] Class: Normal   Order  #: [id number] Class: Historical Med    Current Medication:  Current Facility-Administered Medications:    azithromycin (ZITHROMAX) 500 mg in sodium chloride 0.9 % 250 mL IVPB, 500 mg, Intravenous, Q24H, Nena Polio, MD, Stopped at 11/26/2020 2315   [START ON 11/27/2020] cefTRIAXone (ROCEPHIN) 1 g in sodium chloride 0.9 % 100 mL IVPB, 1 g, Intravenous, Q24H, Belue, Nathan S, RPH   chlorpheniramine-HYDROcodone (TUSSIONEX) 10-8 MG/5ML suspension 5 mL, 5 mL, Oral, Q12H PRN, Jonelle Sidle, Mohammad L, MD   dexamethasone (DECADRON) injection 6 mg, 6 mg, Intravenous, Q24H, Garba, Mohammad L, MD, 6 mg at 11/26/20 0622   enoxaparin (LOVENOX) injection 30 mg, 30 mg, Subcutaneous, Q24H, Garba, Mohammad L, MD   guaiFENesin-dextromethorphan (ROBITUSSIN DM) 100-10 MG/5ML syrup 10 mL, 10 mL, Oral, Q4H PRN, Garba, Mohammad L, MD   Ipratropium-Albuterol (COMBIVENT) respimat 1 puff, 1 puff, Inhalation, Q6H, Garba, Mohammad L, MD   lactated ringers infusion, , Intravenous, Continuous, Ezekiel Slocumb, DO, Last Rate: 150 mL/hr at 11/26/20 0259, New Bag at 11/26/20 0259   ondansetron (ZOFRAN) tablet 4 mg, 4 mg, Oral, Q6H PRN **OR** ondansetron (ZOFRAN) injection 4 mg, 4 mg, Intravenous, Q6H PRN, Elwyn Reach, MD   [COMPLETED] remdesivir 200 mg in sodium chloride 0.9% 250 mL IVPB, 200 mg, Intravenous, Once, Stopped at 11/26/20 0329 **FOLLOWED BY** [START ON 11/27/2020] remdesivir 100 mg in sodium chloride 0.9 % 100 mL IVPB, 100 mg, Intravenous, Daily, Belue, Alver Sorrow, RPH  Current Outpatient Medications:    amLODipine (NORVASC) 5 MG tablet, Take 5 mg by mouth daily., Disp: , Rfl:    citalopram (CELEXA) 20 MG tablet, Take 20 mg by mouth daily., Disp: , Rfl:    diphenhydramine-acetaminophen (TYLENOL PM) 25-500 MG TABS tablet, Take 1 tablet by mouth at bedtime as needed. Pt splits medication into 3, Disp: , Rfl:    ferrous sulfate 325 (65 FE) MG tablet, Take 325 mg by mouth daily., Disp: , Rfl:    lisinopril (ZESTRIL) 30  MG tablet, Take 30 mg by mouth daily., Disp: , Rfl:    omeprazole (PRILOSEC) 20 MG capsule, Take 20 mg by mouth every morning., Disp: , Rfl:    ruxolitinib phosphate (JAKAFI) 15 MG tablet, Take 1 tablet (15 mg total) by mouth 2 (two) times daily., Disp: 60 tablet, Rfl: 3   Vitamin D, Ergocalciferol, (DRISDOL) 50000 units CAPS capsule, Take 50,000 Units by mouth every Sunday., Disp: , Rfl:     ALLERGIES   Patient has no known allergies.     REVIEW OF SYSTEMS    Review of Systems:  Gen:  Denies  fever, sweats, chills weigh loss  HEENT: Denies blurred  vision, double vision, ear pain, eye pain, hearing loss, nose bleeds, sore throat Cardiac:  No dizziness, chest pain or heaviness, chest tightness,edema Resp:   Denies cough or sputum porduction, shortness of breath,wheezing, hemoptysis,  Gi: Denies swallowing difficulty, stomach pain, nausea or vomiting, diarrhea, constipation, bowel incontinence Gu:  Denies bladder incontinence, burning urine Ext:   Denies Joint pain, stiffness or swelling Skin: Denies  skin rash, easy bruising or bleeding or hives Endoc:  Denies polyuria, polydipsia , polyphagia or weight change Psych:   Denies depression, insomnia or hallucinations   Other:  All other systems negative   VS: BP (!) 162/63   Pulse 80   Temp 98.6 F (37 C) (Oral)   Resp 16   Ht 5' (1.524 m)   Wt 44.5 kg   SpO2 97%   BMI 19.14 kg/m      PHYSICAL EXAM    GENERAL:NAD, no fevers, chills, no weakness no fatigue HEAD: Normocephalic, atraumatic.  EYES: Pupils equal, round, reactive to light. Extraocular muscles intact. No scleral icterus.  MOUTH: Moist mucosal membrane. Dentition intact. No abscess noted.  EAR, NOSE, THROAT: Clear without exudates. No external lesions.  NECK: Supple. No thyromegaly. No nodules. No JVD.  PULMONARY: Mild rhonchi bilaterally  CARDIOVASCULAR: S1 and S2. Regular rate and rhythm. No murmurs, rubs, or gallops. No edema. Pedal pulses 2+  bilaterally.  GASTROINTESTINAL: Soft, nontender, nondistended. No masses. Positive bowel sounds. No hepatosplenomegaly.  MUSCULOSKELETAL: No swelling, clubbing, or edema. Range of motion full in all extremities.  NEUROLOGIC: Cranial nerves II through XII are intact. No gross focal neurological deficits. Sensation intact. Reflexes intact.  SKIN: No ulceration, lesions, rashes, or cyanosis. Skin warm and dry. Turgor intact.  PSYCHIATRIC: Mood, affect within normal limits. The patient is awake, alert and oriented x 3. Insight, judgment intact.       IMAGING    DG Chest 2 View  Result Date: 11/15/2020 CLINICAL DATA:  Shortness of breath EXAM: CHEST - 2 VIEW COMPARISON:  04/28/2020 FINDINGS: Nodular opacity in the left upper lobe, possibly associated with the anterior left 3rd rib. However, if pulmonary, this would be suspicious for an mass. Mild patchy right lower lobe opacity, chronic, likely atelectasis versus scarring. Trace left pleural effusion. The heart is top-normal in size.  Thoracic aortic atherosclerosis. IMPRESSION: Nodular opacity in the left upper lobe, possibly osseous, mass not excluded. Consider CT chest with contrast for further evaluation lobe. Mild patchy right lower lobe opacity, favoring atelectasis versus scarring. Trace left pleural effusion. Electronically Signed   By: Julian Hy M.D.   On: 12/03/2020 19:39   CT Chest W Contrast  Result Date: 12/03/2020 CLINICAL DATA:  Abnormal chest x-ray EXAM: CT CHEST WITH CONTRAST TECHNIQUE: Multidetector CT imaging of the chest was performed during intravenous contrast administration. CONTRAST:  43m OMNIPAQUE IOHEXOL 300 MG/ML  SOLN COMPARISON:  Chest x-ray 11/30/2020, 07/11/2016, CT abdomen 12/04/2018 FINDINGS: Cardiovascular: Advanced aortic atherosclerosis. No aneurysmal dilatation. No dissection seen. Cardiomegaly. Coronary vascular calcification. No pericardial effusion Mediastinum/Nodes: Midline trachea. No thyroid mass.  Nonspecific mediastinal lymph nodes measuring up to 8 mm in the precarinal space. Esophagus within normal limits. Lungs/Pleura: Emphysema. Small right greater than left pleural effusions. 3.2 by 2.7 by 2.2 cm spiculated left upper lobe lung mass. Smaller slightly spiculated right upper lobe lung nodule measuring 11 by 10 by 10 mm, series 3, image 63. Bilateral lower lobe bronchial wall thickening with mild mucous plugging in the right lower lobe. Patchy consolidation in the right lower lobe  and right middle lobe. No visible pneumothorax. Upper Abdomen: Incompletely visualized cyst exophytic to the upper pole right kidney. Adrenal gland adenomas. Incompletely visualized spleen appears enlarged. Musculoskeletal: No acute osseous abnormality. Chronic compression fracture at L1 IMPRESSION: 1. Spiculated left upper lobe 3.2 cm lung mass concerning for carcinoma. There is an additional smaller spiculated suspicious nodule in the right upper lobe measuring 11 mm. 2. Cardiomegaly with small right greater than left pleural effusions. Lower lobe bronchial wall thickening with mild mucous plugging in right lower lobe. Ground-glass density and patchy consolidation in the right lower lobe and right middle lobe potentially due to aspiration or pneumonia. 3. Emphysema 4. Incompletely visualized splenomegaly.  Adrenal gland adenomas. Aortic Atherosclerosis (ICD10-I70.0) and Emphysema (ICD10-J43.9). Electronically Signed   By: Donavan Foil M.D.   On: 11/28/2020 22:09      ASSESSMENT/PLAN   Left lung mass  3.2 cm spiculated left upper lobe lung mass.  -will evaluate via PET post dc   Right upper lobe lung nodule slightly spiculated right upper lobe lung nodule measuring 11 by 10 by 10 mm, series 3, image 63 -will evaluate post full treatment for COVID19   Acute COVID19 pneumonia -Remdesevir antiviral - pharmacy protocol 5 d -vitamin C -zinc -decadron 53m IV daily  -encourage to use IS and Acapella device for  bronchopulmonary hygiene when able -d/c hepatotoxic medications while on remdesevir -supportive care with elemetry monitoring -PT/OT when possible -procalcitonin, CRP and ferritin trending   Bilateral pleural effusions  Note patient is on IVF 75cc/hr recommendation to dc fluids   Thank you for allowing me to participate in the care of this patient.  Total face to face encounter time for this patient visit was >412m. >50% of the time was  spent in counseling and coordination of care.   Patient/Family are satisfied with care plan and all questions have been answered.  This document was prepared using Dragon voice recognition software and may include unintentional dictation errors.     FuOttie GlazierM.D.  Division of [place]

## 2020-11-26 NOTE — ED Notes (Signed)
Pt yelling stating "help me! I need to get out of here" Pt seemed to be confused and unaware of place or situation. Pt reassured that she was in the hospital to help treat her. Pt states, "I can't stand this room, I can't stay in here" explained that there is no other option. Pt given call bell and explained that she needed anything that she could press call bell. Call bell given at this time.

## 2020-11-26 NOTE — ED Notes (Signed)
Olivia RN aware of assigned bed 

## 2020-11-26 NOTE — Progress Notes (Signed)
PHARMACIST - PHYSICIAN COMMUNICATION  CONCERNING:  Enoxaparin (Lovenox) for DVT Prophylaxis    RECOMMENDATION: Patient was prescribed enoxaprin 40mg  q24 hours for VTE prophylaxis.   Filed Weights   11/10/2020 1908  Weight: 44.5 kg (98 lb)    Body mass index is 19.14 kg/m.  Estimated Creatinine Clearance: 30.5 mL/min (by C-G formula based on SCr of 0.86 mg/dL).  Patient is candidate for enoxaparin 30mg  every 24 hours based on CrCl <37ml/min or Weight <45kg  DESCRIPTION: Pharmacy has adjusted enoxaparin dose per [facility] policy.  Patient is now receiving enoxaparin 30 mg every 24 hours   Renda Rolls, PharmD, [facility] 11/26/2020 5:42 AM

## 2020-11-26 NOTE — Progress Notes (Signed)
PROGRESS NOTE    [name]   LSL:[id number]  DOB: Jul 04, 1928  PCP: Tracie Harrier, MD    DOA: 11/24/2020 LOS: 1    Brief Narrative / Hospital Course to Date:   Per H&P "87 y.o. female with medical history significant of COPD, depression, myelofibrosis, history of subarachnoid hemorrhage, history of tobacco abuse, left eye blindness, hard of hearing, history of anterior cerebral artery aneurysm, essential hypertension among other things who was apparently sent over from [facility] with congestion, of shortness of breath.  She was initially assisted with oxygen sats in the 60s.  After warming up in the ER her oxygen sat was 79% on room air.  Patient placed on oxygen.  She is talking in complete sentences but seem to have exertional dyspnea.  Associated with some fever.  Patient also has some basal crackles with some mild pedal edema.  Work-up showed that patient meets sepsis criteria with a white count of 16.2.  Some tachycardia as well as lactic acid elevated.  She also has hypoxia with oxygen sats 79% on room air.  She has elevated BNP which is new with findings suggestive of congestive heart failure but more importantly patient was found to have COVID-19 positive results with new lung lesions concerning for new malignancy.  She is therefore being admitted to the hospital for further evaluation and treatment.    Patient was found to be positive for Covid-19.  Chest xray and CT chest with contrast showed RML and RLL consolidations consistent with ?aspiration pneumonia and a LUL mass.  Admitted and started on remdesevir, steroids, antibiotics.  Pulmonology consulted.  Assessment & Plan   Principal Problem:   Sepsis due to COVID-19 [facility]) Active Problems:   Acute on chronic respiratory failure with hypoxia (HCC)   COPD (chronic obstructive pulmonary disease) (HCC)   Myelofibrosis (HCC)   HTN (hypertension)   Depression   Vascular dementia without behavioral disturbance  (HCC)   Pneumonia due to COVID-19 virus   Acute diastolic CHF (congestive heart failure) (HCC)   Hypokalemia   Lung mass   Sepsis due to Covid-19 vs aspiration pneumonia with  Acute respiratory failure with hypoxia -  Continue remdesivir, steroids and antibiotics Supplement O2, maintain sat > 90%, wean as tolerated. (Does not require O2 at baseline). Monitor inflammatory markers, CMP, CBC  New LUL lung mass - pulmonology consulted. Dr. Tasia Catchings, patient's oncologist with whom she follows for myelofibrosis therapy has been updated on this finding.  Myelofibrosis - Dr. Tasia Catchings aware of admission, advised to hold patient's Jakafi.  Essential hypertension continue home lisinopril and amlodipine.   Hypokalemia: Replaced potassium. Monitor and replace as needed   COPD with mild exacerbation: Secondary to acute infection.  Continue with steroids and breathing treatments   Vascular dementia: At baseline.  No acute issues or behavioral disturbance noted. Delirium precautions.   ?New onset CHF: Suspected diastolic dysfunction.   Echo pending - follow up Monitor volume status   Depression with anxiety: continue Celexa  Normocytic anemia: due to chronic disease.  Hbg stable. Follow CBC's  GERD - continue PPI    Patient BMI: Body mass index is 19.14 kg/m.   DVT prophylaxis: enoxaparin (LOVENOX) injection 30 mg Start: 11/26/20 0800   Diet:  Diet Orders (From admission, onward)     Start     Ordered   11/26/20 0530  Diet Heart Room service appropriate? Yes; Fluid consistency: Thin  Diet effective now       Question Answer Comment  Room  service appropriate? Yes   Fluid consistency: Thin      11/26/20 0529              Code Status: Full Code   Subjective 11/26/20    Pt seen in ED holding for a bed, brother at bedside.  She reports feeling about the same, slightly better. Has productive cough.  No fever/chills.   Disposition Plan & Communication   Status is:  Inpatient  Remains inpatient appropriate because: severity of illness as above.   Family Communication: brother at bedside on rounds today 10/20    Consults, Procedures, Significant Events   Consultants:  pulmonology  Procedures:  none  Antimicrobials:  Anti-infectives (From admission, onward)    Start     Dose/Rate Route Frequency Ordered Stop   11/27/20 2100  cefTRIAXone (ROCEPHIN) 1 g in sodium chloride 0.9 % 100 mL IVPB        1 g 200 mL/hr over 30 Minutes Intravenous Every 24 hours 11/26/20 0546 12/01/20 2059   11/27/20 1000  remdesivir 100 mg in sodium chloride 0.9 % 100 mL IVPB  Status:  Discontinued       See Hyperspace for full Linked Orders Report.   100 mg 200 mL/hr over 30 Minutes Intravenous Daily 11/26/20 0529 11/26/20 0543   11/27/20 1000  remdesivir 100 mg in sodium chloride 0.9 % 100 mL IVPB       See Hyperspace for full Linked Orders Report.   100 mg 200 mL/hr over 30 Minutes Intravenous Daily 11/12/2020 2322 12/01/20 0959   11/26/20 0530  remdesivir 200 mg in sodium chloride 0.9% 250 mL IVPB  Status:  Discontinued       See Hyperspace for full Linked Orders Report.   200 mg 580 mL/hr over 30 Minutes Intravenous Once 11/26/20 0529 11/26/20 0543   11/26/20 0529  azithromycin (ZITHROMAX) 500 mg in sodium chloride 0.9 % 250 mL IVPB  Status:  Discontinued        500 mg 250 mL/hr over 60 Minutes Intravenous Every 24 hours 11/26/20 0529 11/26/20 0543   11/26/20 0529  cefTRIAXone (ROCEPHIN) 1 g in sodium chloride 0.9 % 100 mL IVPB  Status:  Discontinued        1 g 200 mL/hr over 30 Minutes Intravenous Every 24 hours 11/26/20 0529 11/26/20 0546   11/26/20 0100  remdesivir 200 mg in sodium chloride 0.9% 250 mL IVPB       See Hyperspace for full Linked Orders Report.   200 mg 580 mL/hr over 30 Minutes Intravenous Once 11/18/2020 2322 11/26/20 0329   12/07/2020 2100  cefTRIAXone (ROCEPHIN) 2 g in sodium chloride 0.9 % 100 mL IVPB  Status:  Discontinued        2 g 200  mL/hr over 30 Minutes Intravenous Every 24 hours 11/08/2020 2057 11/26/20 0546   11/17/2020 2100  azithromycin (ZITHROMAX) 500 mg in sodium chloride 0.9 % 250 mL IVPB        500 mg 250 mL/hr over 60 Minutes Intravenous Every 24 hours 11/11/2020 2057 11/30/20 2059         Micro    Objective   Vitals:   11/26/20 1200 11/26/20 1300 11/26/20 1305 11/26/20 1500  BP: (!) 137/47 (!) 134/50  (!) 148/54  Pulse: 69 66 65 77  Resp: (!) 26 (!) 26 (!) 25 15  Temp:      TempSrc:      SpO2: 93% 95% 93% 94%  Weight:      Height:  Intake/Output Summary (Last 24 hours) at 11/26/2020 1522 Last data filed at 11/26/2020 0329 Gross per 24 hour  Intake 1140 ml  Output --  Net 1140 ml   Filed Weights   12/05/2020 1908  Weight: 44.5 kg    Physical Exam:  General exam: awake, alert, no acute distress, frail HEENT: moist mucus membranes, hard of hearing  Respiratory system: diffuse rhonchi, normal respiratory effort, on 4 L/min o2 [place]. Cardiovascular system: normal S1/S2, RRR, b/l LE edema.   Gastrointestinal system: soft, NT, ND, no HSM felt, +bowel sounds. Central nervous system: A&Ox3. no gross focal neurologic deficits, normal speech Extremities: diffuse patchy ecchymosis of BLE's, LE 1-2+ edema, normal tone Skin: dry, intact, normal temperature Psychiatry: normal mood, congruent affect, judgement and insight appear normal  Labs   Data Reviewed: I have personally reviewed following labs and imaging studies  CBC: Recent Labs  Lab 11/15/2020 1911 11/26/20 0658  WBC 16.2* 9.3  NEUTROABS 10.8*  --   HGB 10.7* 9.3*  HCT 35.5* 31.0*  MCV 103.2* 103.7*  PLT 153 270*   Basic Metabolic Panel: Recent Labs  Lab 12/02/2020 1911 11/26/20 0658  NA 141  --   K 3.4*  --   CL 105  --   CO2 28  --   GLUCOSE 117*  --   BUN 19  --   CREATININE 0.86 0.71  CALCIUM 8.2*  --    GFR: Estimated Creatinine Clearance: 32.8 mL/min (by C-G formula based on SCr of 0.71 mg/dL). Liver Function  Tests: Recent Labs  Lab 11/11/2020 1911  AST 34  ALT 19  ALKPHOS 58  BILITOT 1.5*  PROT 6.8  ALBUMIN 3.9   No results for input(s): LIPASE, AMYLASE in the last 168 hours. No results for input(s): AMMONIA in the last 168 hours. Coagulation Profile: No results for input(s): INR, PROTIME in the last 168 hours. Cardiac Enzymes: No results for input(s): CKTOTAL, CKMB, CKMBINDEX, TROPONINI in the last 168 hours. BNP (last 3 results) No results for input(s): PROBNP in the last 8760 hours. HbA1C: No results for input(s): HGBA1C in the last 72 hours. CBG: No results for input(s): GLUCAP in the last 168 hours. Lipid Profile: No results for input(s): CHOL, HDL, LDLCALC, TRIG, CHOLHDL, LDLDIRECT in the last 72 hours. Thyroid Function Tests: No results for input(s): TSH, T4TOTAL, FREET4, T3FREE, THYROIDAB in the last 72 hours. Anemia Panel: No results for input(s): VITAMINB12, FOLATE, FERRITIN, TIBC, IRON, RETICCTPCT in the last 72 hours. Sepsis Labs: Recent Labs  Lab 11/24/2020 2107 11/19/2020 2352  PROCALCITON 0.18  --   LATICACIDVEN  --  0.7    Recent Results (from the past 240 hour(s))  Culture, blood (single)     Status: None (Preliminary result)   Collection Time: 11/24/2020  9:07 PM   Specimen: BLOOD  Result Value Ref Range Status   Specimen Description BLOOD RIGHT ASSIST CONTROL  Final   Special Requests   Final    BOTTLES DRAWN AEROBIC AND ANAEROBIC Blood Culture adequate volume   Culture   Final    NO GROWTH < 12 HOURS Performed at [facility], [address] [place], [place] [address]    Report Status PENDING  Incomplete  Resp Panel by RT-PCR (Flu A&B, Covid) Nasopharyngeal Swab     Status: Abnormal   Collection Time: 11/24/2020  9:07 PM   Specimen: Nasopharyngeal Swab; Nasopharyngeal(NP) swabs in vial transport medium  Result Value Ref Range Status   SARS Coronavirus 2 by RT PCR POSITIVE (  A) NEGATIVE Final    Comment: RESULT CALLED TO, READ BACK BY AND  VERIFIED WITH: MICHELE ROMERO@[id number]  11/15/2020 RH (NOTE) SARS-CoV-2 target nucleic acids are DETECTED.  The SARS-CoV-2 RNA is generally detectable in upper respiratory specimens during the acute phase of infection. Positive results are indicative of the presence of the identified virus, but do not rule out bacterial infection or co-infection with other pathogens not detected by the test. Clinical correlation with patient history and other diagnostic information is necessary to determine patient infection status. The expected result is Negative.  Fact Sheet for Patients: EntrepreneurPulse.com.au  Fact Sheet for Healthcare Providers: IncredibleEmployment.be  This test is not yet approved or cleared by the [place] FDA and  has been authorized for detection and/or diagnosis of SARS-CoV-2 by FDA under an Emergency Use Authorization (EUA).  This EUA will remain in effect (meaning this test can be u sed) for the duration of  the COVID-19 declaration under Section 564(b)(1) of the Act, 21 U.S.C. section 360bbb-3(b)(1), unless the authorization is terminated or revoked sooner.     Influenza A by PCR NEGATIVE NEGATIVE Final   Influenza B by PCR NEGATIVE NEGATIVE Final    Comment: (NOTE) The Xpert Xpress SARS-CoV-2/FLU/RSV plus assay is intended as an aid in the diagnosis of influenza from Nasopharyngeal swab specimens and should not be used as a sole basis for treatment. Nasal washings and aspirates are unacceptable for Xpert Xpress SARS-CoV-2/FLU/RSV testing.  Fact Sheet for Patients: EntrepreneurPulse.com.au  Fact Sheet for Healthcare Providers: IncredibleEmployment.be  This test is not yet approved or cleared by the [place] FDA and has been authorized for detection and/or diagnosis of SARS-CoV-2 by FDA under an Emergency Use Authorization (EUA). This EUA will remain in effect (meaning this test can  be used) for the duration of the COVID-19 declaration under Section 564(b)(1) of the Act, 21 U.S.C. section 360bbb-3(b)(1), unless the authorization is terminated or revoked.  Performed at [facility], [address] [place], [place] [address]       Imaging Studies   DG Chest 2 View  Result Date: 11/18/2020 CLINICAL DATA:  Shortness of breath EXAM: CHEST - 2 VIEW COMPARISON:  04/28/2020 FINDINGS: Nodular opacity in the left upper lobe, possibly associated with the anterior left 3rd rib. However, if pulmonary, this would be suspicious for an mass. Mild patchy right lower lobe opacity, chronic, likely atelectasis versus scarring. Trace left pleural effusion. The heart is top-normal in size.  Thoracic aortic atherosclerosis. IMPRESSION: Nodular opacity in the left upper lobe, possibly osseous, mass not excluded. Consider CT chest with contrast for further evaluation lobe. Mild patchy right lower lobe opacity, favoring atelectasis versus scarring. Trace left pleural effusion. Electronically Signed   By: Julian Hy M.D.   On: 11/09/2020 19:39   CT Chest W Contrast  Result Date: 11/13/2020 CLINICAL DATA:  Abnormal chest x-ray EXAM: CT CHEST WITH CONTRAST TECHNIQUE: Multidetector CT imaging of the chest was performed during intravenous contrast administration. CONTRAST:  71mL OMNIPAQUE IOHEXOL 300 MG/ML  SOLN COMPARISON:  Chest x-ray 12/01/2020, 07/11/2016, CT abdomen 12/04/2018 FINDINGS: Cardiovascular: Advanced aortic atherosclerosis. No aneurysmal dilatation. No dissection seen. Cardiomegaly. Coronary vascular calcification. No pericardial effusion Mediastinum/Nodes: Midline trachea. No thyroid mass. Nonspecific mediastinal lymph nodes measuring up to 8 mm in the precarinal space. Esophagus within normal limits. Lungs/Pleura: Emphysema. Small right greater than left pleural effusions. 3.2 by 2.7 by 2.2 cm spiculated left upper lobe lung mass. Smaller slightly spiculated right  upper lobe lung nodule measuring 11  by 10 by 10 mm, series 3, image 63. Bilateral lower lobe bronchial wall thickening with mild mucous plugging in the right lower lobe. Patchy consolidation in the right lower lobe and right middle lobe. No visible pneumothorax. Upper Abdomen: Incompletely visualized cyst exophytic to the upper pole right kidney. Adrenal gland adenomas. Incompletely visualized spleen appears enlarged. Musculoskeletal: No acute osseous abnormality. Chronic compression fracture at L1 IMPRESSION: 1. Spiculated left upper lobe 3.2 cm lung mass concerning for carcinoma. There is an additional smaller spiculated suspicious nodule in the right upper lobe measuring 11 mm. 2. Cardiomegaly with small right greater than left pleural effusions. Lower lobe bronchial wall thickening with mild mucous plugging in right lower lobe. Ground-glass density and patchy consolidation in the right lower lobe and right middle lobe potentially due to aspiration or pneumonia. 3. Emphysema 4. Incompletely visualized splenomegaly.  Adrenal gland adenomas. Aortic Atherosclerosis (ICD10-I70.0) and Emphysema (ICD10-J43.9). Electronically Signed   By: Donavan Foil M.D.   On: 11/13/2020 22:09     Medications   Scheduled Meds:  dexamethasone (DECADRON) injection  6 mg Intravenous Q24H   enoxaparin (LOVENOX) injection  30 mg Subcutaneous Q24H   Ipratropium-Albuterol  1 puff Inhalation Q6H   potassium chloride  20 mEq Oral Once   Continuous Infusions:  azithromycin Stopped (11/16/2020 2315)   [START ON 11/27/2020] cefTRIAXone (ROCEPHIN)  IV     lactated ringers 75 mL/hr at 11/26/20 1034   [START ON 11/27/2020] remdesivir 100 mg in NS 100 mL         LOS: 1 day    Time spent: 30 minutes    Ezekiel Slocumb, DO [facility]  11/26/2020, 3:22 PM      If 7PM-7AM, please contact night-coverage. How to contact the [facility] Attending or Consulting provider [place] or covering provider during after hours [place], for this patient?    Check the care team in [facility] and look for a) attending/consulting [facility] provider listed and b) the [facility] team listed Log into [email or web address] and use [place] universal password to access. If you do not have the password, please contact the hospital operator. Locate the [facility] provider you are looking for under [facility] and page to a number that you can be directly reached. If you still have difficulty reaching the provider, please page the [facility] (Director on Call) for the Hospitalists listed on amion for assistance.

## 2020-11-26 NOTE — ED Notes (Signed)
Son at bedside at this time.

## 2020-11-26 NOTE — ED Notes (Signed)
Pt sleeping, resting comfortably in bed, NAD, chest rise & fall. No needs identified at this time. Bed low & locked; call light & personal items within reach. 

## 2020-11-26 NOTE — ED Notes (Signed)
This RN asked receiving nurse to change purple man green. That has not happened at this point, but Catrina, Therapist, sports (receiving nurse) states that it is ok to send pt up at this time. Pt in transit at this time.

## 2020-11-26 NOTE — ED Notes (Signed)
Lunch meal tray given at this time.  

## 2020-11-27 DIAGNOSIS — A4189 Other specified sepsis: Secondary | ICD-10-CM | POA: Diagnosis not present

## 2020-11-27 DIAGNOSIS — U071 COVID-19: Secondary | ICD-10-CM | POA: Diagnosis not present

## 2020-11-27 LAB — CBC WITH DIFFERENTIAL/PLATELET
Abs Immature Granulocytes: 1.81 10*3/uL — ABNORMAL HIGH (ref 0.00–0.07)
Basophils Absolute: 0.1 10*3/uL (ref 0.0–0.1)
Basophils Relative: 1 %
Eosinophils Absolute: 0.1 10*3/uL (ref 0.0–0.5)
Eosinophils Relative: 0 %
HCT: 31 % — ABNORMAL LOW (ref 36.0–46.0)
Hemoglobin: 9.2 g/dL — ABNORMAL LOW (ref 12.0–15.0)
Immature Granulocytes: 16 %
Lymphocytes Relative: 4 %
Lymphs Abs: 0.4 10*3/uL — ABNORMAL LOW (ref 0.7–4.0)
MCH: 31 pg (ref 26.0–34.0)
MCHC: 29.7 g/dL — ABNORMAL LOW (ref 30.0–36.0)
MCV: 104.4 fL — ABNORMAL HIGH (ref 80.0–100.0)
Monocytes Absolute: 1.1 10*3/uL — ABNORMAL HIGH (ref 0.1–1.0)
Monocytes Relative: 10 %
Neutro Abs: 8.2 10*3/uL — ABNORMAL HIGH (ref 1.7–7.7)
Neutrophils Relative %: 69 %
Platelets: 123 10*3/uL — ABNORMAL LOW (ref 150–400)
RBC: 2.97 MIL/uL — ABNORMAL LOW (ref 3.87–5.11)
RDW: 22.5 % — ABNORMAL HIGH (ref 11.5–15.5)
Smear Review: NORMAL
WBC: 11.7 10*3/uL — ABNORMAL HIGH (ref 4.0–10.5)
nRBC: 19.6 % — ABNORMAL HIGH (ref 0.0–0.2)

## 2020-11-27 LAB — COMPREHENSIVE METABOLIC PANEL
ALT: 18 U/L (ref 0–44)
AST: 28 U/L (ref 15–41)
Albumin: 3.1 g/dL — ABNORMAL LOW (ref 3.5–5.0)
Alkaline Phosphatase: 52 U/L (ref 38–126)
Anion gap: 5 (ref 5–15)
BUN: 20 mg/dL (ref 8–23)
CO2: 31 mmol/L (ref 22–32)
Calcium: 8.1 mg/dL — ABNORMAL LOW (ref 8.9–10.3)
Chloride: 107 mmol/L (ref 98–111)
Creatinine, Ser: 0.76 mg/dL (ref 0.44–1.00)
GFR, Estimated: >60 mL/min (ref >60)
Glucose, Bld: 85 mg/dL (ref 70–99)
Potassium: 4 mmol/L (ref 3.5–5.1)
Sodium: 143 mmol/L (ref 135–145)
Total Bilirubin: 0.7 mg/dL (ref 0.3–1.2)
Total Protein: 5.8 g/dL — ABNORMAL LOW (ref 6.5–8.1)

## 2020-11-27 LAB — FERRITIN: Ferritin: 80 ng/mL (ref 11–307)

## 2020-11-27 LAB — GLUCOSE, CAPILLARY: Glucose-Capillary: 124 mg/dL — ABNORMAL HIGH (ref 70–99)

## 2020-11-27 LAB — C-REACTIVE PROTEIN: CRP: 1.4 mg/dL — ABNORMAL HIGH (ref <1.0)

## 2020-11-27 LAB — D-DIMER, QUANTITATIVE: D-Dimer, Quant: 1.72 ug/mL-FEU — ABNORMAL HIGH (ref 0.00–0.50)

## 2020-11-27 MED ORDER — SENNA 8.6 MG PO TABS
1.0000 | ORAL_TABLET | Freq: Every day | ORAL | Status: DC
  Administered 2020-11-27: 8.6 mg via ORAL
  Filled 2020-11-27 (×2): qty 1

## 2020-11-27 MED ORDER — POLYETHYLENE GLYCOL 3350 17 G PO PACK
17.0000 g | PACK | Freq: Every day | ORAL | Status: DC
  Administered 2020-11-27 – 2020-11-30 (×3): 17 g via ORAL
  Filled 2020-11-27 (×3): qty 1

## 2020-11-27 NOTE — Progress Notes (Signed)
   11/27/20 0655  Clinical Encounter Type  Visited With Patient not available  Visit Type Initial;Other (Comment) (RR)  Spiritual Encounters  Spiritual Needs Other (Comment) (not assessed)  Chaplain Burris attended a rapid response. Pt not available will receiving care. Chaplain made presence know and offered support to staff. Recommend follow up for Pt later this morning.

## 2020-11-27 NOTE — Progress Notes (Signed)
PROGRESS NOTE    [name]   NOB:[id number]  DOB: 14-Apr-1928  PCP: Tracie Harrier, MD    DOA: 11/26/2020 LOS: 2    Brief Narrative / Hospital Course to Date:   Per H&P "87 y.o. female with medical history significant of COPD, depression, myelofibrosis, history of subarachnoid hemorrhage, history of tobacco abuse, left eye blindness, hard of hearing, history of anterior cerebral artery aneurysm, essential hypertension among other things who was apparently sent over [facility] with congestion, of shortness of breath.  She was initially assisted with oxygen sats in the 60s.  After warming up in the ER her oxygen sat was 79% on room air.  Patient placed on oxygen.  She is talking in complete sentences but seem to have exertional dyspnea.  Associated with some fever.  Patient also has some basal crackles with some mild pedal edema.  Work-up showed that patient meets sepsis criteria with a white count of 16.2.  Some tachycardia as well as lactic acid elevated.  She also has hypoxia with oxygen sats 79% on room air.  She has elevated BNP which is new with findings suggestive of congestive heart failure but more importantly patient was found to have COVID-19 positive results with new lung lesions concerning for new malignancy.  She is therefore being admitted to the hospital for further evaluation and treatment.    Patient was found to be positive for Covid-19.  Chest xray and CT chest with contrast showed RML and RLL consolidations consistent with ?aspiration pneumonia and a LUL mass.  Admitted and started on remdesevir, steroids, antibiotics.  Pulmonology consulted.  Assessment & Plan   Principal Problem:   Sepsis due to COVID-19 [facility]) Active Problems:   Acute on chronic respiratory failure with hypoxia (HCC)   COPD (chronic obstructive pulmonary disease) (HCC)   Myelofibrosis (HCC)   HTN (hypertension)   Depression   Vascular dementia without behavioral disturbance  (HCC)   Pneumonia due to COVID-19 virus   Acute diastolic CHF (congestive heart failure) (HCC)   Hypokalemia   Lung mass   Sepsis due to Covid-19 vs aspiration pneumonia with  Acute respiratory failure with hypoxia -  Continue remdesivir, steroids and antibiotics Supplement O2, maintain sat > 90%, wean as tolerated. (Does not require O2 at baseline). Monitor inflammatory markers, CMP, CBC   CODE STATUS Discussed 10/22 -  Pt confirmed full code for now.  Wants to discuss with her children.  Encouraged ongoing discussions about whether she would want extraordinary measures or to be placed on ventilator.  Sister at bedside agrees patient should discuss with her children.   Palliative care consulted.   New LUL lung mass - pulmonology consulted. Dr. Tasia Catchings, patient's oncologist with whom she follows for myelofibrosis therapy has been updated on this finding.  Myelofibrosis - Dr. Tasia Catchings aware of admission, advised to hold patient's Jakafi.  Essential hypertension continue home lisinopril and amlodipine.   Hypokalemia: Replaced potassium. Monitor and replace as needed   COPD with mild exacerbation: Secondary to acute infection.  Continue with steroids and breathing treatments   Vascular dementia: At baseline.  No acute issues or behavioral disturbance noted. Delirium precautions.   ?New onset CHF: Suspected diastolic dysfunction.   Echo pending - follow up Monitor volume status   Depression with anxiety: continue Celexa  Normocytic anemia: due to chronic disease.  Hbg stable. Follow CBC's  GERD - continue PPI    Patient BMI: Body mass index is 19.14 kg/m.   DVT prophylaxis: enoxaparin (  LOVENOX) injection 30 mg Start: 11/26/20 0800   Diet:  Diet Orders (From admission, onward)     Start     Ordered   11/26/20 0530  Diet Heart Room service appropriate? Yes; Fluid consistency: Thin  Diet effective now       Question Answer Comment  Room service appropriate? Yes   Fluid  consistency: Thin      11/26/20 0529              Code Status: Full Code   Subjective 11/27/20    Pt seen with sister at bedside today.  Rapid response called this AM due to increased O2 need.  This has stabilized on 7 Lmin salter HFNC.  Pt reports feeling lousy.  Continues to cough, maybe little better than yesterday.  No other acute complaints.    Disposition Plan & Communication   Status is: Inpatient  Remains inpatient appropriate because: severity of illness as above. Increased oxygen requirement today.  Family Communication: sister at bedside on rounds today 10/21   Consults, Procedures, Significant Events   Consultants:  pulmonology  Procedures:  none  Antimicrobials:  Anti-infectives (From admission, onward)    Start     Dose/Rate Route Frequency Ordered Stop   11/27/20 2100  cefTRIAXone (ROCEPHIN) 1 g in sodium chloride 0.9 % 100 mL IVPB        1 g 200 mL/hr over 30 Minutes Intravenous Every 24 hours 11/26/20 0546 12/01/20 2059   11/27/20 1000  remdesivir 100 mg in sodium chloride 0.9 % 100 mL IVPB  Status:  Discontinued       See Hyperspace for full Linked Orders Report.   100 mg 200 mL/hr over 30 Minutes Intravenous Daily 11/26/20 0529 11/26/20 0543   11/27/20 1000  remdesivir 100 mg in sodium chloride 0.9 % 100 mL IVPB       See Hyperspace for full Linked Orders Report.   100 mg 200 mL/hr over 30 Minutes Intravenous Daily 11/18/2020 2322 12/01/20 0959   11/26/20 0530  remdesivir 200 mg in sodium chloride 0.9% 250 mL IVPB  Status:  Discontinued       See Hyperspace for full Linked Orders Report.   200 mg 580 mL/hr over 30 Minutes Intravenous Once 11/26/20 0529 11/26/20 0543   11/26/20 0529  azithromycin (ZITHROMAX) 500 mg in sodium chloride 0.9 % 250 mL IVPB  Status:  Discontinued        500 mg 250 mL/hr over 60 Minutes Intravenous Every 24 hours 11/26/20 0529 11/26/20 0543   11/26/20 0529  cefTRIAXone (ROCEPHIN) 1 g in sodium chloride 0.9 % 100 mL IVPB   Status:  Discontinued        1 g 200 mL/hr over 30 Minutes Intravenous Every 24 hours 11/26/20 0529 11/26/20 0546   11/26/20 0100  remdesivir 200 mg in sodium chloride 0.9% 250 mL IVPB       See Hyperspace for full Linked Orders Report.   200 mg 580 mL/hr over 30 Minutes Intravenous Once 12/07/2020 2322 11/26/20 0329   11/07/2020 2100  cefTRIAXone (ROCEPHIN) 2 g in sodium chloride 0.9 % 100 mL IVPB  Status:  Discontinued        2 g 200 mL/hr over 30 Minutes Intravenous Every 24 hours 11/17/2020 2057 11/26/20 0546   11/29/2020 2100  azithromycin (ZITHROMAX) 500 mg in sodium chloride 0.9 % 250 mL IVPB        500 mg 250 mL/hr over 60 Minutes Intravenous Every 24 hours 12/01/2020 2057  11/30/20 2059         Micro    Objective   Vitals:   11/26/20 2041 11/27/20 0610 11/27/20 0658 11/27/20 0942  BP: (!) 177/90 (!) 156/62 (!) 189/83 134/75  Pulse: 76 70 96 94  Resp:      Temp: 98.4 F (36.9 C) 98.9 F (37.2 C)    TempSrc: Oral Oral    SpO2: 91% 92% 90% 95%  Weight:      Height:        Intake/Output Summary (Last 24 hours) at 11/27/2020 1712 Last data filed at 11/27/2020 1700 Gross per 24 hour  Intake 732.79 ml  Output 400 ml  Net 332.79 ml   Filed Weights   11/09/2020 1908  Weight: 44.5 kg    Physical Exam:  General exam: awake, alert, no acute distress, frail Respiratory system: diffuse rhonchi, normal respiratory effort, on 7 L/min o2 HFNC. Cardiovascular system: normal S1/S2, RRR, b/l LE edema.   Gastrointestinal system: soft, non-tender abdomen Central nervous system: A&Ox3. no gross focal neurologic deficits, normal speech Extremities: diffuse patchy ecchymosis of BLE's, mildly improved BLE edema, normal tone Psychiatry: normal mood, congruent affect, judgement and insight appear normal  Labs   Data Reviewed: I have personally reviewed following labs and imaging studies  CBC: Recent Labs  Lab 11/14/2020 1911 11/26/20 0658 11/27/20 0408  WBC 16.2* 9.3 11.7*   NEUTROABS 10.8*  --  8.2*  HGB 10.7* 9.3* 9.2*  HCT 35.5* 31.0* 31.0*  MCV 103.2* 103.7* 104.4*  PLT 153 136* 818*   Basic Metabolic Panel: Recent Labs  Lab 11/08/2020 1911 11/26/20 0658 11/27/20 0408  NA 141  --  143  K 3.4*  --  4.0  CL 105  --  107  CO2 28  --  31  GLUCOSE 117*  --  85  BUN 19  --  20  CREATININE 0.86 0.71 0.76  CALCIUM 8.2*  --  8.1*   GFR: Estimated Creatinine Clearance: 32.8 mL/min (by C-G formula based on SCr of 0.76 mg/dL). Liver Function Tests: Recent Labs  Lab 11/29/2020 1911 11/27/20 0408  AST 34 28  ALT 19 18  ALKPHOS 58 52  BILITOT 1.5* 0.7  PROT 6.8 5.8*  ALBUMIN 3.9 3.1*   No results for input(s): LIPASE, AMYLASE in the last 168 hours. No results for input(s): AMMONIA in the last 168 hours. Coagulation Profile: No results for input(s): INR, PROTIME in the last 168 hours. Cardiac Enzymes: No results for input(s): CKTOTAL, CKMB, CKMBINDEX, TROPONINI in the last 168 hours. BNP (last 3 results) No results for input(s): PROBNP in the last 8760 hours. HbA1C: No results for input(s): HGBA1C in the last 72 hours. CBG: No results for input(s): GLUCAP in the last 168 hours. Lipid Profile: No results for input(s): CHOL, HDL, LDLCALC, TRIG, CHOLHDL, LDLDIRECT in the last 72 hours. Thyroid Function Tests: No results for input(s): TSH, T4TOTAL, FREET4, T3FREE, THYROIDAB in the last 72 hours. Anemia Panel: Recent Labs    11/27/20 0408  FERRITIN 80   Sepsis Labs: Recent Labs  Lab 12/07/2020 2107 11/09/2020 2352  PROCALCITON 0.18  --   LATICACIDVEN  --  0.7    Recent Results (from the past 240 hour(s))  Culture, blood (single)     Status: None (Preliminary result)   Collection Time: 11/13/2020  9:07 PM   Specimen: BLOOD  Result Value Ref Range Status   Specimen Description BLOOD RIGHT ASSIST CONTROL  Final   Special Requests   Final  BOTTLES DRAWN AEROBIC AND ANAEROBIC Blood Culture adequate volume   Culture   Final    NO GROWTH 2  DAYS Performed at [facility], [address] [place], [place] [address]    Report Status PENDING  Incomplete  Resp Panel by RT-PCR (Flu A&B, Covid) Nasopharyngeal Swab     Status: Abnormal   Collection Time: 11/20/2020  9:07 PM   Specimen: Nasopharyngeal Swab; Nasopharyngeal(NP) swabs in vial transport medium  Result Value Ref Range Status   SARS Coronavirus 2 by RT PCR POSITIVE (A) NEGATIVE Final    Comment: RESULT CALLED TO, READ BACK BY AND VERIFIED WITH: MICHELE ROMERO@[id number]  12/01/2020 RH (NOTE) SARS-CoV-2 target nucleic acids are DETECTED.  The SARS-CoV-2 RNA is generally detectable in upper respiratory specimens during the acute phase of infection. Positive results are indicative of the presence of the identified virus, but do not rule out bacterial infection or co-infection with other pathogens not detected by the test. Clinical correlation with patient history and other diagnostic information is necessary to determine patient infection status. The expected result is Negative.  Fact Sheet for Patients: EntrepreneurPulse.com.au  Fact Sheet for Healthcare Providers: IncredibleEmployment.be  This test is not yet approved or cleared by the [place] FDA and  has been authorized for detection and/or diagnosis of SARS-CoV-2 by FDA under an Emergency Use Authorization (EUA).  This EUA will remain in effect (meaning this test can be u sed) for the duration of  the COVID-19 declaration under Section 564(b)(1) of the Act, 21 U.S.C. section 360bbb-3(b)(1), unless the authorization is terminated or revoked sooner.     Influenza A by PCR NEGATIVE NEGATIVE Final   Influenza B by PCR NEGATIVE NEGATIVE Final    Comment: (NOTE) The Xpert Xpress SARS-CoV-2/FLU/RSV plus assay is intended as an aid in the diagnosis of influenza from Nasopharyngeal swab specimens and should not be used as a sole basis for treatment. Nasal washings  and aspirates are unacceptable for Xpert Xpress SARS-CoV-2/FLU/RSV testing.  Fact Sheet for Patients: EntrepreneurPulse.com.au  Fact Sheet for Healthcare Providers: IncredibleEmployment.be  This test is not yet approved or cleared by the [place] FDA and has been authorized for detection and/or diagnosis of SARS-CoV-2 by FDA under an Emergency Use Authorization (EUA). This EUA will remain in effect (meaning this test can be used) for the duration of the COVID-19 declaration under Section 564(b)(1) of the Act, 21 U.S.C. section 360bbb-3(b)(1), unless the authorization is terminated or revoked.  Performed at [facility], [address] [place], [place] [address]       Imaging Studies   DG Chest 2 View  Result Date: 11/28/2020 CLINICAL DATA:  Shortness of breath EXAM: CHEST - 2 VIEW COMPARISON:  04/28/2020 FINDINGS: Nodular opacity in the left upper lobe, possibly associated with the anterior left 3rd rib. However, if pulmonary, this would be suspicious for an mass. Mild patchy right lower lobe opacity, chronic, likely atelectasis versus scarring. Trace left pleural effusion. The heart is top-normal in size.  Thoracic aortic atherosclerosis. IMPRESSION: Nodular opacity in the left upper lobe, possibly osseous, mass not excluded. Consider CT chest with contrast for further evaluation lobe. Mild patchy right lower lobe opacity, favoring atelectasis versus scarring. Trace left pleural effusion. Electronically Signed   By: Julian Hy M.D.   On: 11/23/2020 19:39   CT Chest W Contrast  Result Date: 11/09/2020 CLINICAL DATA:  Abnormal chest x-ray EXAM: CT CHEST WITH CONTRAST TECHNIQUE: Multidetector CT imaging of the chest was performed during intravenous contrast  administration. CONTRAST:  57mL OMNIPAQUE IOHEXOL 300 MG/ML  SOLN COMPARISON:  Chest x-ray 11/17/2020, 07/11/2016, CT abdomen 12/04/2018 FINDINGS: Cardiovascular: Advanced  aortic atherosclerosis. No aneurysmal dilatation. No dissection seen. Cardiomegaly. Coronary vascular calcification. No pericardial effusion Mediastinum/Nodes: Midline trachea. No thyroid mass. Nonspecific mediastinal lymph nodes measuring up to 8 mm in the precarinal space. Esophagus within normal limits. Lungs/Pleura: Emphysema. Small right greater than left pleural effusions. 3.2 by 2.7 by 2.2 cm spiculated left upper lobe lung mass. Smaller slightly spiculated right upper lobe lung nodule measuring 11 by 10 by 10 mm, series 3, image 63. Bilateral lower lobe bronchial wall thickening with mild mucous plugging in the right lower lobe. Patchy consolidation in the right lower lobe and right middle lobe. No visible pneumothorax. Upper Abdomen: Incompletely visualized cyst exophytic to the upper pole right kidney. Adrenal gland adenomas. Incompletely visualized spleen appears enlarged. Musculoskeletal: No acute osseous abnormality. Chronic compression fracture at L1 IMPRESSION: 1. Spiculated left upper lobe 3.2 cm lung mass concerning for carcinoma. There is an additional smaller spiculated suspicious nodule in the right upper lobe measuring 11 mm. 2. Cardiomegaly with small right greater than left pleural effusions. Lower lobe bronchial wall thickening with mild mucous plugging in right lower lobe. Ground-glass density and patchy consolidation in the right lower lobe and right middle lobe potentially due to aspiration or pneumonia. 3. Emphysema 4. Incompletely visualized splenomegaly.  Adrenal gland adenomas. Aortic Atherosclerosis (ICD10-I70.0) and Emphysema (ICD10-J43.9). Electronically Signed   By: Donavan Foil M.D.   On: 12/02/2020 22:09   ECHOCARDIOGRAM COMPLETE  Result Date: 11/26/2020    ECHOCARDIOGRAM REPORT   Patient Name:   MEE MACDONNELL Date of Exam: 11/26/2020 Medical Rec #:  [id number]          Height:       60.0 in Accession #:    [id number]         Weight:       98.0 lb Date of Birth:   12/08/1928          BSA:          1.378 m Patient Age:    48 years           BP:           139/57 mmHg Patient Gender: F                  HR:           83 bpm. Exam Location:  [facility] Procedure: 2D Echo, Cardiac Doppler and Color Doppler Indications:     CHF-acute diastolic W10.27  History:         Patient has prior history of Echocardiogram examinations, most                  recent 04/29/2020. Risk Factors:Hypertension. Tobacco abuse.  Sonographer:     Sherrie Sport Referring Phys:  [place] Diagnosing Phys: Ida Rogue MD IMPRESSIONS  1. Left ventricular ejection fraction, by estimation, is 55 to 60%. The left ventricle has normal function. The left ventricle has no regional wall motion abnormalities. Left ventricular diastolic parameters are indeterminate.  2. Right ventricular systolic function is low normal. The right ventricular size is severely enlarged. There is moderately elevated pulmonary artery systolic pressure. The estimated right ventricular systolic pressure is 25.3 mmHg.  3. Left atrial size was mildly dilated.  4. Right atrial size was moderately dilated.  5. The mitral valve is normal in structure. No evidence of  mitral valve regurgitation. No evidence of mitral stenosis.  6. Tricuspid valve regurgitation is moderate.  7. The aortic valve was not well visualized. Aortic valve regurgitation is mild to moderate. Mild to moderate aortic valve sclerosis/calcification is present, without any evidence of aortic stenosis.  8. The inferior vena cava is normal in size with greater than 50% respiratory variability, suggesting right atrial pressure of 3 mmHg.  9. Large left pleural effusion noted FINDINGS  Left Ventricle: Left ventricular ejection fraction, by estimation, is 55 to 60%. The left ventricle has normal function. The left ventricle has no regional wall motion abnormalities. The left ventricular internal cavity size was normal in size. There is  no left ventricular hypertrophy. Left  ventricular diastolic parameters are indeterminate. Right Ventricle: The right ventricular size is severely enlarged. No increase in right ventricular wall thickness. Right ventricular systolic function is low normal. There is moderately elevated pulmonary artery systolic pressure. The tricuspid regurgitant velocity is 3.60 m/s, and with an assumed right atrial pressure of 5 mmHg, the estimated right ventricular systolic pressure is 85.2 mmHg. Left Atrium: Left atrial size was mildly dilated. Right Atrium: Right atrial size was moderately dilated. Pericardium: There is no evidence of pericardial effusion. Mitral Valve: The mitral valve is normal in structure. There is mild thickening of the mitral valve leaflet(s). Mild mitral annular calcification. No evidence of mitral valve regurgitation. No evidence of mitral valve stenosis. Tricuspid Valve: The tricuspid valve is normal in structure. Tricuspid valve regurgitation is moderate . No evidence of tricuspid stenosis. Aortic Valve: The aortic valve was not well visualized. Aortic valve regurgitation is mild to moderate. Aortic regurgitation PHT measures 414 msec. Mild to moderate aortic valve sclerosis/calcification is present, without any evidence of aortic stenosis.  Aortic valve mean gradient measures 3.0 mmHg. Aortic valve peak gradient measures 6.7 mmHg. Aortic valve area, by VTI measures 4.11 cm. Pulmonic Valve: The pulmonic valve was normal in structure. Pulmonic valve regurgitation is not visualized. No evidence of pulmonic stenosis. Aorta: The aortic root is normal in size and structure. Venous: The inferior vena cava is normal in size with greater than 50% respiratory variability, suggesting right atrial pressure of 3 mmHg. IAS/Shunts: No atrial level shunt detected by color flow Doppler.  LEFT VENTRICLE PLAX 2D LVIDd:         5.10 cm   Diastology LVIDs:         2.90 cm   LV e' medial:  6.85 cm/s LV PW:         1.20 cm   LV e' lateral: 11.00 cm/s LV IVS:         0.90 cm LVOT diam:     2.00 cm LV SV:         90 LV SV Index:   65 LVOT Area:     3.14 cm  RIGHT VENTRICLE RV S prime:     11.90 cm/s TAPSE (M-mode): 4.6 cm LEFT ATRIUM              Index        RIGHT ATRIUM           Index LA diam:        3.90 cm  2.83 cm/m   RA Area:     26.60 cm LA Vol (A2C):   102.0 ml 74.00 ml/m  RA Volume:   97.30 ml  70.59 ml/m LA Vol (A4C):   67.4 ml  48.90 ml/m LA Biplane Vol: 82.6 ml  59.92 ml/m  AORTIC VALVE                     PULMONIC VALVE AV Area (Vmax):    3.92 cm      PV Vmax:        0.63 m/s AV Area (Vmean):   4.63 cm      PV Peak grad:   1.6 mmHg AV Area (VTI):     4.11 cm      RVOT Peak grad: 3 mmHg AV Vmax:           129.00 cm/s AV Vmean:          75.300 cm/s AV VTI:            0.218 m AV Peak Grad:      6.7 mmHg AV Mean Grad:      3.0 mmHg LVOT Vmax:         161.00 cm/s LVOT Vmean:        111.000 cm/s LVOT VTI:          0.285 m LVOT/AV VTI ratio: 1.31 AI PHT:            414 msec  AORTA Ao Root diam: 3.30 cm TRICUSPID VALVE TR Peak grad:   51.8 mmHg TR Vmax:        360.00 cm/s  SHUNTS Systemic VTI:  0.29 m Systemic Diam: 2.00 cm Ida Rogue MD Electronically signed by Ida Rogue MD Signature Date/Time: 11/26/2020/8:49:32 PM    Final      Medications   Scheduled Meds:  amLODipine  5 mg Oral Daily   citalopram  20 mg Oral Daily   dexamethasone (DECADRON) injection  6 mg Intravenous Q24H   enoxaparin (LOVENOX) injection  30 mg Subcutaneous Q24H   Ipratropium-Albuterol  1 puff Inhalation Q6H   lisinopril  30 mg Oral Daily   pantoprazole  40 mg Oral Daily   polyethylene glycol  17 g Oral Daily   senna  1 tablet Oral QHS   Continuous Infusions:  azithromycin Stopped (11/26/20 2340)   cefTRIAXone (ROCEPHIN)  IV     remdesivir 100 mg in NS 100 mL 100 mg (11/27/20 1328)       LOS: 2 days    Time spent: 30 minutes    Ezekiel Slocumb, DO [facility]  11/27/2020, 5:12 PM      If 7PM-7AM, please contact night-coverage. How  to contact the [facility] Attending or Consulting provider [place] or covering provider during after hours Miguel Barrera, for this patient?    Check the care team in [facility] and look for a) attending/consulting [facility] provider listed and b) the [facility] team listed Log into [email or web address] and use [place] universal password to access. If you do not have the password, please contact the hospital operator. Locate the [facility] provider you are looking for under [facility] and page to a number that you can be directly reached. If you still have difficulty reaching the provider, please page the [facility] (Director on Call) for the Hospitalists listed on amion for assistance.

## 2020-11-27 NOTE — Progress Notes (Signed)
Patient began yelling for help.Upon entering the room this nurse noted that patient had removed her nasal canula and in extreme respiratory distress as her hue was blue, labored breathing and congestion. Oxygen is immediately reapplied and adjusted from 4l to 6L. Patient is placed in high fowler and rapid response is initiated due to no increase in progress. Within minutes staff arrived to the bedside to assess patient. Per verbal orders given by Sharion Settler, NP patient is converted over to high flow nasal canula and she rebounds within minutes. Her breathing becomes less labored and her oxygen saturation is 92 on 6L of high flow nasal canula. Orders have been obtained to transfer to 2c. At this time, the patient remains on the unit, no obvious signs of distress is noted as high flow oxygen therapy continues.

## 2020-11-27 NOTE — Progress Notes (Signed)
Pulmonary Medicine          Date: 11/27/2020,   MRN# [id number] [name] 01/27/1929     AdmissionWeight: 44.5 kg                 CurrentWeight: 44.5 kg    Referrng physician: Dr Arbutus Ped  CHIEF COMPLAINT:   Mass of left lung   HISTORY OF PRESENT ILLNESS   [name] is a 86 y.o. female with medical history significant of COPD, depression, myelofibrosis, history of subarachnoid hemorrhage, history of tobacco abuse, left eye blindness, hard of hearing, history of anterior cerebral artery aneurysm, essential hypertension among other things who was apparently sent over from [place] [facility] with congestion, of shortness of breath.  She was found to have COVID19 + She was initially assisted with oxygen sats in the 60s.  After warming up in the ER her oxygen sat was 79% on room air.  Patient placed on oxygen.  She is talking in complete sentences but seem to have exertional dyspnea.  Associated with some fever.  Patient also has some basal crackles with some mild pedal edema.  Work-up showed that patient meets sepsis criteria with a white count of 16.2.  Some tachycardia as well as lactic acid elevated.  She also has hypoxia with oxygen sats 79% on room air.   Temperature is 98.6, blood pressure 178/67, pulse 59, respirate of 19 oxygen sats 79% on room air.  White count 16.2 hemoglobin 10.7 platelets 153.  Sodium 141 potassium 3.4 chloride 105 CO2 is 28 BUN 19 creatinine 0.86 and calcium 8.2.  Troponin is flat at 30.  BNP of 639.  Procalcitonin 0.18.  COVID-19 screen is positive but influenza negative.  Urinalysis currently pending.  Chest x-ray showed nodular opacity in the left upper lobe possibly mass versus mild patchy right lower lobe opacity also trace pleural effusion.  CT chest with contrast showed spiculated left upper lobe 3.2 cm lung mass concerning for carcinoma.  Also additional smaller 1 in the right upper lobe about 11 mm.  There is cardiomegaly with small  right greater than left pleural effusions.  Groundglass density and patchy consolidation in the right lower lobe and right middle lobe possibly aspiration pneumonia.  Absent emphysema.  PCCM consultation for additional guidance regarding lung mass. I met with brother of patient and we discussed findings together with patient. We discussed that its probably safest to have full scope of therapy for COVID prior to biopsy of any kind.    11/27/20- no overnight events, will sign off until in chronic stable state for cancer workup on outpatient.   PAST MEDICAL HISTORY   Past Medical History:  Diagnosis Date   Aneurysm of anterior cerebral artery    Blindness of left eye    secondary to cataract surgery complication   Depression    Hypertension    Myelofibrosis (HCC)    SAH (subarachnoid hemorrhage) ([place]) 02/2006   Tobacco abuse      SURGICAL HISTORY   Past Surgical History:  Procedure Laterality Date   ABDOMINAL HYSTERECTOMY     ANEURYSM COILING     CATARACT EXTRACTION     CHOLECYSTECTOMY       FAMILY HISTORY   Family History  Problem Relation Age of Onset   Breast cancer Daughter 10   Breast cancer Sister    Throat cancer Mother    Lung cancer Father    Prostate cancer Brother    Leukemia Brother  Prostate cancer Brother    Leukemia Brother      SOCIAL HISTORY   Social History   Tobacco Use   Smoking status: Former    Types: Cigarettes    Quit date: 07/31/2015    Years since quitting: 5.3   Smokeless tobacco: Never  Vaping Use   Vaping Use: Never used  Substance Use Topics   Alcohol use: Yes    Alcohol/week: 1.0 standard drink    Types: 1 Glasses of wine per week    Comment: once a month   Drug use: No     MEDICATIONS    Home Medication:  REM   Current Medication:  Current Facility-Administered Medications:    amLODipine (NORVASC) tablet 5 mg, 5 mg, Oral, Daily, Nicole Kindred A, DO, 5 mg at 11/26/20 2102   azithromycin (ZITHROMAX) 500 mg in  sodium chloride 0.9 % 250 mL IVPB, 500 mg, Intravenous, Q24H, Nena Polio, MD, Last Rate: 250 mL/hr at 11/26/20 2232, 500 mg at 11/26/20 2232   cefTRIAXone (ROCEPHIN) 1 g in sodium chloride 0.9 % 100 mL IVPB, 1 g, Intravenous, Q24H, Belue, Alver Sorrow, RPH   chlorpheniramine-HYDROcodone (TUSSIONEX) 10-8 MG/5ML suspension 5 mL, 5 mL, Oral, Q12H PRN, Elwyn Reach, MD, 5 mL at 11/26/20 2101   citalopram (CELEXA) tablet 20 mg, 20 mg, Oral, Daily, Nicole Kindred A, DO, 20 mg at 11/26/20 2102   dexamethasone (DECADRON) injection 6 mg, 6 mg, Intravenous, Q24H, Jonelle Sidle, Mohammad L, MD, 6 mg at 11/27/20 0611   enoxaparin (LOVENOX) injection 30 mg, 30 mg, Subcutaneous, Q24H, Garba, Mohammad L, MD, 30 mg at 11/26/20 1027   guaiFENesin-dextromethorphan (ROBITUSSIN DM) 100-10 MG/5ML syrup 10 mL, 10 mL, Oral, Q4H PRN, Garba, Mohammad L, MD   Ipratropium-Albuterol (COMBIVENT) respimat 1 puff, 1 puff, Inhalation, Q6H, Garba, Mohammad L, MD, 1 puff at 11/27/20 0746   lisinopril (ZESTRIL) tablet 30 mg, 30 mg, Oral, Daily, Nicole Kindred A, DO, 30 mg at 11/26/20 2102   ondansetron (ZOFRAN) tablet 4 mg, 4 mg, Oral, Q6H PRN **OR** ondansetron (ZOFRAN) injection 4 mg, 4 mg, Intravenous, Q6H PRN, Jonelle Sidle, Mohammad L, MD   pantoprazole (PROTONIX) EC tablet 40 mg, 40 mg, Oral, Daily, Nicole Kindred A, DO, 40 mg at 11/26/20 2102   [COMPLETED] remdesivir 200 mg in sodium chloride 0.9% 250 mL IVPB, 200 mg, Intravenous, Once, Stopped at 11/26/20 0329 **FOLLOWED BY** remdesivir 100 mg in sodium chloride 0.9 % 100 mL IVPB, 100 mg, Intravenous, Daily, Belue, Alver Sorrow, RPH    ALLERGIES   Patient has no known allergies.     REVIEW OF SYSTEMS    Review of Systems:  Gen:  Denies  fever, sweats, chills weigh loss  HEENT: Denies blurred vision, double vision, ear pain, eye pain, hearing loss, nose bleeds, sore throat Cardiac:  No dizziness, chest pain or heaviness, chest tightness,edema Resp:   Denies cough or sputum  porduction, shortness of breath,wheezing, hemoptysis,  Gi: Denies swallowing difficulty, stomach pain, nausea or vomiting, diarrhea, constipation, bowel incontinence Gu:  Denies bladder incontinence, burning urine Ext:   Denies Joint pain, stiffness or swelling Skin: Denies  skin rash, easy bruising or bleeding or hives Endoc:  Denies polyuria, polydipsia , polyphagia or weight change Psych:   Denies depression, insomnia or hallucinations   Other:  All other systems negative   VS: BP (!) 189/83 (BP Location: Right Arm)   Pulse 96   Temp 98.9 F (37.2 C) (Oral)   Resp (!) 22   Ht 5' (  1.524 m)   Wt 44.5 kg   SpO2 90%   BMI 19.14 kg/m      PHYSICAL EXAM    GENERAL:NAD, no fevers, chills, no weakness no fatigue HEAD: Normocephalic, atraumatic.  EYES: Pupils equal, round, reactive to light. Extraocular muscles intact. No scleral icterus.  MOUTH: Moist mucosal membrane. Dentition intact. No abscess noted.  EAR, NOSE, THROAT: Clear without exudates. No external lesions.  NECK: Supple. No thyromegaly. No nodules. No JVD.  PULMONARY: Mild rhonchi bilaterally  CARDIOVASCULAR: S1 and S2. Regular rate and rhythm. No murmurs, rubs, or gallops. No edema. Pedal pulses 2+ bilaterally.  GASTROINTESTINAL: Soft, nontender, nondistended. No masses. Positive bowel sounds. No hepatosplenomegaly.  MUSCULOSKELETAL: No swelling, clubbing, or edema. Range of motion full in all extremities.  NEUROLOGIC: Cranial nerves II through XII are intact. No gross focal neurological deficits. Sensation intact. Reflexes intact.  SKIN: No ulceration, lesions, rashes, or cyanosis. Skin warm and dry. Turgor intact.  PSYCHIATRIC: Mood, affect within normal limits. The patient is awake, alert and oriented x 3. Insight, judgment intact.       IMAGING    DG Chest 2 View  Result Date: 11/12/2020 CLINICAL DATA:  Shortness of breath EXAM: CHEST - 2 VIEW COMPARISON:  04/28/2020 FINDINGS: Nodular opacity in the left  upper lobe, possibly associated with the anterior left 3rd rib. However, if pulmonary, this would be suspicious for an mass. Mild patchy right lower lobe opacity, chronic, likely atelectasis versus scarring. Trace left pleural effusion. The heart is top-normal in size.  Thoracic aortic atherosclerosis. IMPRESSION: Nodular opacity in the left upper lobe, possibly osseous, mass not excluded. Consider CT chest with contrast for further evaluation lobe. Mild patchy right lower lobe opacity, favoring atelectasis versus scarring. Trace left pleural effusion. Electronically Signed   By: Julian Hy M.D.   On: 11/23/2020 19:39   CT Chest W Contrast  Result Date: 11/09/2020 CLINICAL DATA:  Abnormal chest x-ray EXAM: CT CHEST WITH CONTRAST TECHNIQUE: Multidetector CT imaging of the chest was performed during intravenous contrast administration. CONTRAST:  13m OMNIPAQUE IOHEXOL 300 MG/ML  SOLN COMPARISON:  Chest x-ray 11/26/2020, 07/11/2016, CT abdomen 12/04/2018 FINDINGS: Cardiovascular: Advanced aortic atherosclerosis. No aneurysmal dilatation. No dissection seen. Cardiomegaly. Coronary vascular calcification. No pericardial effusion Mediastinum/Nodes: Midline trachea. No thyroid mass. Nonspecific mediastinal lymph nodes measuring up to 8 mm in the precarinal space. Esophagus within normal limits. Lungs/Pleura: Emphysema. Small right greater than left pleural effusions. 3.2 by 2.7 by 2.2 cm spiculated left upper lobe lung mass. Smaller slightly spiculated right upper lobe lung nodule measuring 11 by 10 by 10 mm, series 3, image 63. Bilateral lower lobe bronchial wall thickening with mild mucous plugging in the right lower lobe. Patchy consolidation in the right lower lobe and right middle lobe. No visible pneumothorax. Upper Abdomen: Incompletely visualized cyst exophytic to the upper pole right kidney. Adrenal gland adenomas. Incompletely visualized spleen appears enlarged. Musculoskeletal: No acute osseous  abnormality. Chronic compression fracture at L1 IMPRESSION: 1. Spiculated left upper lobe 3.2 cm lung mass concerning for carcinoma. There is an additional smaller spiculated suspicious nodule in the right upper lobe measuring 11 mm. 2. Cardiomegaly with small right greater than left pleural effusions. Lower lobe bronchial wall thickening with mild mucous plugging in right lower lobe. Ground-glass density and patchy consolidation in the right lower lobe and right middle lobe potentially due to aspiration or pneumonia. 3. Emphysema 4. Incompletely visualized splenomegaly.  Adrenal gland adenomas. Aortic Atherosclerosis (ICD10-I70.0) and Emphysema (ICD10-J43.9). Electronically Signed  By: Donavan Foil M.D.   On: 11/11/2020 22:09   ECHOCARDIOGRAM COMPLETE  Result Date: 11/26/2020    ECHOCARDIOGRAM REPORT   Patient Name:   KEILEE DENMAN Date of Exam: 11/26/2020 Medical Rec #:  [id number]          Height:       60.0 in Accession #:    [id number]         Weight:       98.0 lb Date of Birth:  02/06/1929          BSA:          1.378 m Patient Age:    66 years           BP:           139/57 mmHg Patient Gender: F                  HR:           83 bpm. Exam Location:  [facility] Procedure: 2D Echo, Cardiac Doppler and Color Doppler Indications:     CHF-acute diastolic B35.32  History:         Patient has prior history of Echocardiogram examinations, most                  recent 04/29/2020. Risk Factors:Hypertension. Tobacco abuse.  Sonographer:     Sherrie Sport Referring Phys:  [place] Diagnosing Phys: Ida Rogue MD IMPRESSIONS  1. Left ventricular ejection fraction, by estimation, is 55 to 60%. The left ventricle has normal function. The left ventricle has no regional wall motion abnormalities. Left ventricular diastolic parameters are indeterminate.  2. Right ventricular systolic function is low normal. The right ventricular size is severely enlarged. There is moderately elevated pulmonary artery  systolic pressure. The estimated right ventricular systolic pressure is 99.2 mmHg.  3. Left atrial size was mildly dilated.  4. Right atrial size was moderately dilated.  5. The mitral valve is normal in structure. No evidence of mitral valve regurgitation. No evidence of mitral stenosis.  6. Tricuspid valve regurgitation is moderate.  7. The aortic valve was not well visualized. Aortic valve regurgitation is mild to moderate. Mild to moderate aortic valve sclerosis/calcification is present, without any evidence of aortic stenosis.  8. The inferior vena cava is normal in size with greater than 50% respiratory variability, suggesting right atrial pressure of 3 mmHg.  9. Large left pleural effusion noted FINDINGS  Left Ventricle: Left ventricular ejection fraction, by estimation, is 55 to 60%. The left ventricle has normal function. The left ventricle has no regional wall motion abnormalities. The left ventricular internal cavity size was normal in size. There is  no left ventricular hypertrophy. Left ventricular diastolic parameters are indeterminate. Right Ventricle: The right ventricular size is severely enlarged. No increase in right ventricular wall thickness. Right ventricular systolic function is low normal. There is moderately elevated pulmonary artery systolic pressure. The tricuspid regurgitant velocity is 3.60 m/s, and with an assumed right atrial pressure of 5 mmHg, the estimated right ventricular systolic pressure is 42.6 mmHg. Left Atrium: Left atrial size was mildly dilated. Right Atrium: Right atrial size was moderately dilated. Pericardium: There is no evidence of pericardial effusion. Mitral Valve: The mitral valve is normal in structure. There is mild thickening of the mitral valve leaflet(s). Mild mitral annular calcification. No evidence of mitral valve regurgitation. No evidence of mitral valve stenosis. Tricuspid Valve: The tricuspid valve is normal in structure. Tricuspid  valve regurgitation is  moderate . No evidence of tricuspid stenosis. Aortic Valve: The aortic valve was not well visualized. Aortic valve regurgitation is mild to moderate. Aortic regurgitation PHT measures 414 msec. Mild to moderate aortic valve sclerosis/calcification is present, without any evidence of aortic stenosis.  Aortic valve mean gradient measures 3.0 mmHg. Aortic valve peak gradient measures 6.7 mmHg. Aortic valve area, by VTI measures 4.11 cm. Pulmonic Valve: The pulmonic valve was normal in structure. Pulmonic valve regurgitation is not visualized. No evidence of pulmonic stenosis. Aorta: The aortic root is normal in size and structure. Venous: The inferior vena cava is normal in size with greater than 50% respiratory variability, suggesting right atrial pressure of 3 mmHg. IAS/Shunts: No atrial level shunt detected by color flow Doppler.  LEFT VENTRICLE PLAX 2D LVIDd:         5.10 cm   Diastology LVIDs:         2.90 cm   LV e' medial:  6.85 cm/s LV PW:         1.20 cm   LV e' lateral: 11.00 cm/s LV IVS:        0.90 cm LVOT diam:     2.00 cm LV SV:         90 LV SV Index:   65 LVOT Area:     3.14 cm  RIGHT VENTRICLE RV S prime:     11.90 cm/s TAPSE (M-mode): 4.6 cm LEFT ATRIUM              Index        RIGHT ATRIUM           Index LA diam:        3.90 cm  2.83 cm/m   RA Area:     26.60 cm LA Vol (A2C):   102.0 ml 74.00 ml/m  RA Volume:   97.30 ml  70.59 ml/m LA Vol (A4C):   67.4 ml  48.90 ml/m LA Biplane Vol: 82.6 ml  59.92 ml/m  AORTIC VALVE                     PULMONIC VALVE AV Area (Vmax):    3.92 cm      PV Vmax:        0.63 m/s AV Area (Vmean):   4.63 cm      PV Peak grad:   1.6 mmHg AV Area (VTI):     4.11 cm      RVOT Peak grad: 3 mmHg AV Vmax:           129.00 cm/s AV Vmean:          75.300 cm/s AV VTI:            0.218 m AV Peak Grad:      6.7 mmHg AV Mean Grad:      3.0 mmHg LVOT Vmax:         161.00 cm/s LVOT Vmean:        111.000 cm/s LVOT VTI:          0.285 m LVOT/AV VTI ratio: 1.31 AI PHT:             414 msec  AORTA Ao Root diam: 3.30 cm TRICUSPID VALVE TR Peak grad:   51.8 mmHg TR Vmax:        360.00 cm/s  SHUNTS Systemic VTI:  0.29 m Systemic Diam: 2.00 cm Ida Rogue MD Electronically signed by Ida Rogue MD Signature Date/Time:  11/26/2020/8:49:32 PM    Final       ASSESSMENT/PLAN   Left lung mass  3.2 cm spiculated left upper lobe lung mass.  -will evaluate via PET post dc   Right upper lobe lung nodule slightly spiculated right upper lobe lung nodule measuring 11 by 10 by 10 mm, series 3, image 63 -will evaluate post full treatment for COVID19   Acute COVID19 pneumonia -Remdesevir antiviral - pharmacy protocol 5 d -vitamin C -zinc -decadron 82m IV daily  -encourage to use IS and Acapella device for bronchopulmonary hygiene when able -d/c hepatotoxic medications while on remdesevir -supportive care with elemetry monitoring -PT/OT when possible -procalcitonin, CRP and ferritin trending   Bilateral pleural effusions  Note patient is on IVF 75cc/hr recommendation to dc fluids   Thank you for allowing me to participate in the care of this patient.  Total face to face encounter time for this patient visit was >464m. >50% of the time was  spent in counseling and coordination of care.   Patient/Family are satisfied with care plan and all questions have been answered.  This document was prepared using Dragon voice recognition software and may include unintentional dictation errors.     FuOttie GlazierM.D.  Division of [facility]

## 2020-11-28 DIAGNOSIS — A4189 Other specified sepsis: Secondary | ICD-10-CM | POA: Diagnosis not present

## 2020-11-28 DIAGNOSIS — U071 COVID-19: Secondary | ICD-10-CM | POA: Diagnosis not present

## 2020-11-28 LAB — CBC WITH DIFFERENTIAL/PLATELET
Abs Immature Granulocytes: 2.03 K/uL — ABNORMAL HIGH (ref 0.00–0.07)
Basophils Absolute: 0.2 K/uL — ABNORMAL HIGH (ref 0.0–0.1)
Basophils Relative: 2 %
Eosinophils Absolute: 0 K/uL (ref 0.0–0.5)
Eosinophils Relative: 0 %
HCT: 31.6 % — ABNORMAL LOW (ref 36.0–46.0)
Hemoglobin: 9.4 g/dL — ABNORMAL LOW (ref 12.0–15.0)
Immature Granulocytes: 16 %
Lymphocytes Relative: 3 %
Lymphs Abs: 0.4 K/uL — ABNORMAL LOW (ref 0.7–4.0)
MCH: 30.9 pg (ref 26.0–34.0)
MCHC: 29.7 g/dL — ABNORMAL LOW (ref 30.0–36.0)
MCV: 103.9 fL — ABNORMAL HIGH (ref 80.0–100.0)
Monocytes Absolute: 0.9 K/uL (ref 0.1–1.0)
Monocytes Relative: 7 %
Neutro Abs: 8.8 K/uL — ABNORMAL HIGH (ref 1.7–7.7)
Neutrophils Relative %: 72 %
Platelets: 126 K/uL — ABNORMAL LOW (ref 150–400)
RBC: 3.04 MIL/uL — ABNORMAL LOW (ref 3.87–5.11)
RDW: 22.5 % — ABNORMAL HIGH (ref 11.5–15.5)
Smear Review: NORMAL
WBC: 12.4 K/uL — ABNORMAL HIGH (ref 4.0–10.5)
nRBC: 10.9 % — ABNORMAL HIGH (ref 0.0–0.2)

## 2020-11-28 LAB — COMPREHENSIVE METABOLIC PANEL WITH GFR
ALT: 18 U/L (ref 0–44)
AST: 27 U/L (ref 15–41)
Albumin: 3.3 g/dL — ABNORMAL LOW (ref 3.5–5.0)
Alkaline Phosphatase: 51 U/L (ref 38–126)
Anion gap: 6 (ref 5–15)
BUN: 21 mg/dL (ref 8–23)
CO2: 31 mmol/L (ref 22–32)
Calcium: 8.3 mg/dL — ABNORMAL LOW (ref 8.9–10.3)
Chloride: 106 mmol/L (ref 98–111)
Creatinine, Ser: 0.77 mg/dL (ref 0.44–1.00)
GFR, Estimated: >60 mL/min
Glucose, Bld: 102 mg/dL — ABNORMAL HIGH (ref 70–99)
Potassium: 4 mmol/L (ref 3.5–5.1)
Sodium: 143 mmol/L (ref 135–145)
Total Bilirubin: 0.6 mg/dL (ref 0.3–1.2)
Total Protein: 6 g/dL — ABNORMAL LOW (ref 6.5–8.1)

## 2020-11-28 LAB — BLOOD GAS, ARTERIAL
Acid-Base Excess: 11.9 mmol/L — ABNORMAL HIGH (ref 0.0–2.0)
Bicarbonate: 40.3 mmol/L — ABNORMAL HIGH (ref 20.0–28.0)
FIO2: 1
O2 Saturation: 98.2 %
Patient temperature: 37
pCO2 arterial: 80 mmHg (ref 32.0–48.0)
pH, Arterial: 7.31 — ABNORMAL LOW (ref 7.350–7.450)
pO2, Arterial: 116 mmHg — ABNORMAL HIGH (ref 83.0–108.0)

## 2020-11-28 LAB — C-REACTIVE PROTEIN: CRP: 6 mg/dL — ABNORMAL HIGH

## 2020-11-28 LAB — D-DIMER, QUANTITATIVE: D-Dimer, Quant: 1.67 ug{FEU}/mL — ABNORMAL HIGH (ref 0.00–0.50)

## 2020-11-28 LAB — FERRITIN: Ferritin: 110 ng/mL (ref 11–307)

## 2020-11-28 MED ORDER — HALOPERIDOL LACTATE 5 MG/ML IJ SOLN
2.0000 mg | Freq: Four times a day (QID) | INTRAMUSCULAR | Status: DC | PRN
  Administered 2020-11-28 – 2020-12-02 (×9): 2 mg via INTRAVENOUS
  Filled 2020-11-28 (×9): qty 1

## 2020-11-28 NOTE — Progress Notes (Addendum)
Patient being transported to ICU for abnormal ABG's to be placed on Bipap. Report called to ICU nurse Estill Bamberg.Daughter Georgiann Mohs notified that mother is being transferred and educated on why.

## 2020-11-28 NOTE — Progress Notes (Signed)
Patient continues yelling for family and then at family when they are in the room, stating she is going home and they are not to leave her. Granddaughter left bedside due to patient's increased agitation.  Patient taking off oxygen and desating.  Oxygen replaced and increased to 14L .Haldol given. RN stayed with patient until she calmed down.  Emotional support given.  Floor mats in use, bed locked in lowest position. Patient doesn't try getting out of bed due to weakness but continues to yell and fidget until she goes to sleep.  Will continue to montior

## 2020-11-28 NOTE — Progress Notes (Signed)
PROGRESS NOTE    [name]   VHQ:[id number]  DOB: [birth year]  PCP: Tracie Harrier, MD    DOA: 11/08/2020 LOS: 3    Brief Narrative / Hospital Course to Date:   Per H&P "87 y.o. female with medical history significant of COPD, depression, myelofibrosis, history of subarachnoid hemorrhage, history of tobacco abuse, left eye blindness, hard of hearing, history of anterior cerebral artery aneurysm, essential hypertension among other things who was apparently sent over from [place] clinic with congestion, of shortness of breath.  She was initially assisted with oxygen sats in the 60s.  After warming up in the ER her oxygen sat was 79% on room air.  Patient placed on oxygen.  She is talking in complete sentences but seem to have exertional dyspnea.  Associated with some fever.  Patient also has some basal crackles with some mild pedal edema.  Work-up showed that patient meets sepsis criteria with a white count of 16.2.  Some tachycardia as well as lactic acid elevated.  She also has hypoxia with oxygen sats 79% on room air.  She has elevated BNP which is new with findings suggestive of congestive heart failure but more importantly patient was found to have COVID-19 positive results with new lung lesions concerning for new malignancy.  She is therefore being admitted to the hospital for further evaluation and treatment.    Patient was found to be positive for Covid-19.  Chest xray and CT chest with contrast showed RML and RLL consolidations consistent with ?aspiration pneumonia and a LUL mass.  Admitted and started on remdesevir, steroids, antibiotics.  Pulmonology consulted.  Assessment & Plan   Principal Problem:   Sepsis due to COVID-19 [facility]) Active Problems:   Acute on chronic respiratory failure with hypoxia (HCC)   COPD (chronic obstructive pulmonary disease) (HCC)   Myelofibrosis (HCC)   HTN (hypertension)   Goals of care, counseling/discussion   Depression   Vascular  dementia without behavioral disturbance ([place])   Pneumonia due to COVID-19 virus   Acute diastolic CHF (congestive heart failure) (HCC)   Hypokalemia   Lung mass   Sepsis due to Covid-19 vs aspiration pneumonia with  Acute respiratory failure with hypoxia -  Continue remdesivir, steroids and antibiotics Supplement O2, maintain sat > 90%, wean as tolerated. (Does not require O2 at baseline). Monitor inflammatory markers, CMP, CBC Pulmonary hygiene with I-S and flutter SLP evaluation of swallow to assess for aspirating   CODE STATUS Discussed 10/22 -  Pt confirmed full code for now.  Wants to discuss with her children.  Encouraged ongoing discussions about whether she would want extraordinary measures or to be placed on ventilator.  Sister at bedside agrees patient should discuss with her children.   Palliative care consulted.   New LUL lung mass - pulmonology consulted. Dr. Tasia Catchings, patient's oncologist with whom she follows for myelofibrosis therapy has been updated on this finding. --Evaluation on hold until recovered from acute illness  Myelofibrosis - Dr. Tasia Catchings aware of admission, advised to hold patient's Jakafi.  Thrombocytopenia -likely due to acute infection.  Monitor CBC.  Essential hypertension continue home lisinopril and amlodipine.   Hypokalemia: Replaced potassium. Monitor and replace as needed   COPD with mild exacerbation: Secondary to acute infection.  Continue with steroids and breathing treatments   Vascular dementia: At baseline.  No acute issues or behavioral disturbance noted. Delirium precautions.   ?New onset CHF: Suspected diastolic dysfunction.   Echo pending - follow up Monitor volume status  Depression with anxiety: continue Celexa  Normocytic anemia: due to chronic disease.  Hbg stable. Follow CBC's  GERD - continue PPI    Patient BMI: Body mass index is 19.14 kg/m.   DVT prophylaxis: enoxaparin (LOVENOX) injection 30 mg Start: 11/26/20  0800   Diet:  Diet Orders (From admission, onward)     Start     Ordered   11/28/20 1308  DIET DYS 3 Room service appropriate? Yes with Assist; Fluid consistency: Thin  Diet effective now       Comments: NO STRAWS!!!  Extra Gravy on chopped meats. CREAM SOUPS.  Question Answer Comment  Room service appropriate? Yes with Assist   Fluid consistency: Thin      11/28/20 1308              Code Status: Full Code   Subjective 11/28/20    Pt up in recliner, brother at bedside when seen this morning.  She overnight she again had increased oxygen requirement and currently on 10 L/min high flow nasal cannula.  She appears more drowsy today but is awake and stays engaged in conversation.  She reports not feeling well and tired.  Continues to have productive cough.  No fevers chills, chest pain or shortness of breath at rest.  Says it feels good to be out of bed.   Disposition Plan & Communication   Status is: Inpatient  Remains inpatient appropriate because: severity of illness as above. Increased oxygen requirement today.  Family Communication: Brother at bedside on rounds today 10/22   Consults, Procedures, Significant Events   Consultants:  pulmonology  Procedures:  none  Antimicrobials:  Anti-infectives (From admission, onward)    Start     Dose/Rate Route Frequency Ordered Stop   11/27/20 2100  cefTRIAXone (ROCEPHIN) 1 g in sodium chloride 0.9 % 100 mL IVPB        1 g 200 mL/hr over 30 Minutes Intravenous Every 24 hours 11/26/20 0546 12/01/20 2059   11/27/20 1000  remdesivir 100 mg in sodium chloride 0.9 % 100 mL IVPB  Status:  Discontinued       See Hyperspace for full Linked Orders Report.   100 mg 200 mL/hr over 30 Minutes Intravenous Daily 11/26/20 0529 11/26/20 0543   11/27/20 1000  remdesivir 100 mg in sodium chloride 0.9 % 100 mL IVPB       See Hyperspace for full Linked Orders Report.   100 mg 200 mL/hr over 30 Minutes Intravenous Daily 11/29/2020 2322  12/01/20 0959   11/26/20 0530  remdesivir 200 mg in sodium chloride 0.9% 250 mL IVPB  Status:  Discontinued       See Hyperspace for full Linked Orders Report.   200 mg 580 mL/hr over 30 Minutes Intravenous Once 11/26/20 0529 11/26/20 0543   11/26/20 0529  azithromycin (ZITHROMAX) 500 mg in sodium chloride 0.9 % 250 mL IVPB  Status:  Discontinued        500 mg 250 mL/hr over 60 Minutes Intravenous Every 24 hours 11/26/20 0529 11/26/20 0543   11/26/20 0529  cefTRIAXone (ROCEPHIN) 1 g in sodium chloride 0.9 % 100 mL IVPB  Status:  Discontinued        1 g 200 mL/hr over 30 Minutes Intravenous Every 24 hours 11/26/20 0529 11/26/20 0546   11/26/20 0100  remdesivir 200 mg in sodium chloride 0.9% 250 mL IVPB       See Hyperspace for full Linked Orders Report.   200 mg 580 mL/hr over 30  Minutes Intravenous Once 11/08/2020 2322 11/26/20 0329   11/11/2020 2100  cefTRIAXone (ROCEPHIN) 2 g in sodium chloride 0.9 % 100 mL IVPB  Status:  Discontinued        2 g 200 mL/hr over 30 Minutes Intravenous Every 24 hours 11/24/2020 2057 11/26/20 0546   11/21/2020 2100  azithromycin (ZITHROMAX) 500 mg in sodium chloride 0.9 % 250 mL IVPB        500 mg 250 mL/hr over 60 Minutes Intravenous Every 24 hours 11/07/2020 2057 11/30/20 2059         Micro    Objective   Vitals:   11/28/20 0526 11/28/20 0532 11/28/20 0533 11/28/20 0908  BP: (!) 162/70   (!) 153/64  Pulse: 80  72 85  Resp: 20   14  Temp: 98.8 F (37.1 C)   99.4 F (37.4 C)  TempSrc: Oral   Oral  SpO2: 90% 91% 92% 93%  Weight:      Height:        Intake/Output Summary (Last 24 hours) at 11/28/2020 1522 Last data filed at 11/28/2020 1328 Gross per 24 hour  Intake 1130 ml  Output --  Net 1130 ml   Filed Weights   11/22/2020 1908  Weight: 44.5 kg    Physical Exam:  General exam: Sitting up in recliner awake but appears more drowsy today, no acute distress, frail Respiratory system: Improved but ongoing diffuse rhonchi, normal respiratory  effort, on 10 L/min HFNC O2 Cardiovascular system: normal S1/S2, RRR, stable bilateral lower extremity edema, JVP noted.   Gastrointestinal system: soft, non-tender abdomen Central nervous system: A&Ox3. no gross focal neurologic deficits, normal speech Extremities: diffuse patchy ecchymosis and hyperpigmentation of BLE's, mildly improved BLE edema, normal tone Psychiatry: normal mood, congruent affect, judgement and insight appear normal  Labs   Data Reviewed: I have personally reviewed following labs and imaging studies  CBC: Recent Labs  Lab 12/06/2020 1911 11/26/20 0658 11/27/20 0408 11/28/20 0520  WBC 16.2* 9.3 11.7* 12.4*  NEUTROABS 10.8*  --  8.2* 8.8*  HGB 10.7* 9.3* 9.2* 9.4*  HCT 35.5* 31.0* 31.0* 31.6*  MCV 103.2* 103.7* 104.4* 103.9*  PLT 153 136* 123* 161*   Basic Metabolic Panel: Recent Labs  Lab 11/19/2020 1911 11/26/20 0658 11/27/20 0408 11/28/20 0520  NA 141  --  143 143  K 3.4*  --  4.0 4.0  CL 105  --  107 106  CO2 28  --  31 31  GLUCOSE 117*  --  85 102*  BUN 19  --  20 21  CREATININE 0.86 0.71 0.76 0.77  CALCIUM 8.2*  --  8.1* 8.3*   GFR: Estimated Creatinine Clearance: 32.8 mL/min (by C-G formula based on SCr of 0.77 mg/dL). Liver Function Tests: Recent Labs  Lab 11/27/2020 1911 11/27/20 0408 11/28/20 0520  AST 34 28 27  ALT 19 18 18   ALKPHOS 58 52 51  BILITOT 1.5* 0.7 0.6  PROT 6.8 5.8* 6.0*  ALBUMIN 3.9 3.1* 3.3*   No results for input(s): LIPASE, AMYLASE in the last 168 hours. No results for input(s): AMMONIA in the last 168 hours. Coagulation Profile: No results for input(s): INR, PROTIME in the last 168 hours. Cardiac Enzymes: No results for input(s): CKTOTAL, CKMB, CKMBINDEX, TROPONINI in the last 168 hours. BNP (last 3 results) No results for input(s): PROBNP in the last 8760 hours. HbA1C: No results for input(s): HGBA1C in the last 72 hours. CBG: Recent Labs  Lab 11/27/20 0652  GLUCAP 124*  Lipid Profile: No results for  input(s): CHOL, HDL, LDLCALC, TRIG, CHOLHDL, LDLDIRECT in the last 72 hours. Thyroid Function Tests: No results for input(s): TSH, T4TOTAL, FREET4, T3FREE, THYROIDAB in the last 72 hours. Anemia Panel: Recent Labs    11/27/20 0408 11/28/20 0520  FERRITIN 80 110   Sepsis Labs: Recent Labs  Lab 11/18/2020 2107 11/29/2020 2352  PROCALCITON 0.18  --   LATICACIDVEN  --  0.7    Recent Results (from the past 240 hour(s))  Culture, blood (single)     Status: None (Preliminary result)   Collection Time: 11/11/2020  9:07 PM   Specimen: BLOOD  Result Value Ref Range Status   Specimen Description BLOOD RIGHT ASSIST CONTROL  Final   Special Requests   Final    BOTTLES DRAWN AEROBIC AND ANAEROBIC Blood Culture adequate volume   Culture   Final    NO GROWTH 2 DAYS Performed at [facility], [address] [place], [place] [address]    Report Status PENDING  Incomplete  Resp Panel by RT-PCR (Flu A&B, Covid) Nasopharyngeal Swab     Status: Abnormal   Collection Time: 11/22/2020  9:07 PM   Specimen: Nasopharyngeal Swab; Nasopharyngeal(NP) swabs in vial transport medium  Result Value Ref Range Status   SARS Coronavirus 2 by RT PCR POSITIVE (A) NEGATIVE Final    Comment: RESULT CALLED TO, READ BACK BY AND VERIFIED WITH: MICHELE [email or web address]  11/20/2020 RH (NOTE) SARS-CoV-2 target nucleic acids are DETECTED.  The SARS-CoV-2 RNA is generally detectable in upper respiratory specimens during the acute phase of infection. Positive results are indicative of the presence of the identified virus, but do not rule out bacterial infection or co-infection with other pathogens not detected by the test. Clinical correlation with patient history and other diagnostic information is necessary to determine patient infection status. The expected result is Negative.  Fact Sheet for Patients: EntrepreneurPulse.com.au  Fact Sheet for Healthcare  Providers: IncredibleEmployment.be  This test is not yet approved or cleared by the [place] FDA and  has been authorized for detection and/or diagnosis of SARS-CoV-2 by FDA under an Emergency Use Authorization (EUA).  This EUA will remain in effect (meaning this test can be u sed) for the duration of  the COVID-19 declaration under Section 564(b)(1) of the Act, 21 U.S.C. section 360bbb-3(b)(1), unless the authorization is terminated or revoked sooner.     Influenza A by PCR NEGATIVE NEGATIVE Final   Influenza B by PCR NEGATIVE NEGATIVE Final    Comment: (NOTE) The Xpert Xpress SARS-CoV-2/FLU/RSV plus assay is intended as an aid in the diagnosis of influenza from Nasopharyngeal swab specimens and should not be used as a sole basis for treatment. Nasal washings and aspirates are unacceptable for Xpert Xpress SARS-CoV-2/FLU/RSV testing.  Fact Sheet for Patients: EntrepreneurPulse.com.au  Fact Sheet for Healthcare Providers: IncredibleEmployment.be  This test is not yet approved or cleared by the [place] FDA and has been authorized for detection and/or diagnosis of SARS-CoV-2 by FDA under an Emergency Use Authorization (EUA). This EUA will remain in effect (meaning this test can be used) for the duration of the COVID-19 declaration under Section 564(b)(1) of the Act, 21 U.S.C. section 360bbb-3(b)(1), unless the authorization is terminated or revoked.  Performed at [facility], [address], [place], [place] [address]       Imaging Studies   No results found.   Medications   Scheduled Meds:  amLODipine  5 mg Oral Daily   citalopram  20 mg  Oral Daily   dexamethasone (DECADRON) injection  6 mg Intravenous Q24H   enoxaparin (LOVENOX) injection  30 mg Subcutaneous Q24H   Ipratropium-Albuterol  1 puff Inhalation Q6H   lisinopril  30 mg Oral Daily   pantoprazole  40 mg Oral Daily   polyethylene  glycol  17 g Oral Daily   senna  1 tablet Oral QHS   Continuous Infusions:  azithromycin Stopped (11/27/20 2140)   cefTRIAXone (ROCEPHIN)  IV Stopped (11/27/20 2354)   remdesivir 100 mg in NS 100 mL 200 mL/hr at 11/28/20 1328       LOS: 3 days    Time spent: 30 minutes    Ezekiel Slocumb, DO [facility]  11/28/2020, 3:22 PM      If 7PM-7AM, please contact night-coverage. How to contact the [facility] Attending or Consulting provider [place] or covering provider during after hours [place], for this patient?    Check the care team in [facility] and look for a) attending/consulting [facility] provider listed and b) the [facility] team listed Log into [email or web address] and use [place] universal password to access. If you do not have the password, please contact the hospital operator. Locate the [facility] provider you are looking for under [facility] and page to a number that you can be directly reached. If you still have difficulty reaching the provider, please page the [facility] (Director on Call) for the Hospitalists listed on amion for assistance.

## 2020-11-28 NOTE — Plan of Care (Signed)
  Problem: Education: Goal: Knowledge of General Education information will improve Description: Including pain rating scale, medication(s)/side effects and non-pharmacologic comfort measures Outcome: Not Progressing   Problem: Health Behavior/Discharge Planning: Goal: Ability to manage health-related needs will improve Outcome: Not Progressing   Problem: Clinical Measurements: Goal: Ability to maintain clinical measurements within normal limits will improve Outcome: Not Progressing Goal: Will remain free from infection Outcome: Progressing Goal: Diagnostic test results will improve Outcome: Not Progressing Goal: Respiratory complications will improve Outcome: Not Progressing Goal: Cardiovascular complication will be avoided Outcome: Progressing   Problem: Activity: Goal: Risk for activity intolerance will decrease Outcome: Not Progressing   Problem: Nutrition: Goal: Adequate nutrition will be maintained Outcome: Not Progressing   Problem: Coping: Goal: Level of anxiety will decrease Outcome: Not Progressing   Problem: Elimination: Goal: Will not experience complications related to bowel motility Outcome: Progressing Goal: Will not experience complications related to urinary retention Outcome: Progressing   Problem: Pain Managment: Goal: General experience of comfort will improve Outcome: Progressing   Problem: Safety: Goal: Ability to remain free from injury will improve Outcome: Progressing   Problem: Skin Integrity: Goal: Risk for impaired skin integrity will decrease Outcome: Progressing

## 2020-11-28 NOTE — Progress Notes (Signed)
Patient very angry, agitated states is going home.  MD notified. Will continue to montior.

## 2020-11-28 NOTE — Progress Notes (Signed)
Nurse called in by nurse tech and notified that patient was de-satting in the 70's. When nurse entered room patient not noted to be in distres, but was satting in the high 70's on 15L high flo and could be heard wheezing. Charge nurse called respiratory therapist for nurse and he arrived to room placing patient on non-rebreather pt. Now satting at 93%. Charge nurse is contacting MD and nurse will follow MD orders given.

## 2020-11-28 NOTE — Evaluation (Addendum)
Clinical/Bedside Swallow Evaluation Patient Details  Name: [name] MRN: [id number] Date of Birth: 1928-02-23  Today's Date: 11/28/2020 Time: SLP Start Time (ACUTE ONLY): 22 SLP Stop Time (ACUTE ONLY): 1325 SLP Time Calculation (min) (ACUTE ONLY): 45 min  Past Medical History:  Past Medical History:  Diagnosis Date   Aneurysm of anterior cerebral artery    Blindness of left eye    secondary to cataract surgery complication   Depression    Hypertension    Myelofibrosis ([place])    SAH (subarachnoid hemorrhage) ([place]) 02/2006   Tobacco abuse    Past Surgical History:  Past Surgical History:  Procedure Laterality Date   ABDOMINAL HYSTERECTOMY     ANEURYSM COILING     CATARACT EXTRACTION     CHOLECYSTECTOMY     HPI:  Pt presented to ER SOB, congestion; admitted for management of acute/chronic respiratory, sepsis due to COVID-19 PNA.  Per Imaging: CT of Chest -- "Spiculated left upper lobe 3.2 cm lung mass concerning for  carcinoma. There is an additional smaller spiculated suspicious  nodule in the right upper lobe measuring 11 mm.  2. Cardiomegaly with small right greater than left pleural  effusions. Lower lobe bronchial wall thickening with mild mucous  plugging in right lower lobe. Ground-glass density and patchy  consolidation in the right lower lobe and right middle lobe; mediastinal lymph nodes measuring up to 8 mm in the precarinal  space. Esophagus within normal limits.".  She does Not have any consistent Imaging suggestive of chronic pneumonia prior to this admit.  PMH: Depression      Hypertension     Myelofibrosis ([place])     SAH (subarachnoid hemorrhage) ([place]) 02/2006   Tobacco abuse.  Anuerysm.  Per Family report, pt has Cognitive decline baseline.    Assessment / Plan / Recommendation  Clinical Impression  Pt appears to present w/ mild, inconsistent s/s of oropharyngeal phase dysphagia suspect in light of declined Cognitive status - Baseline per Daughter present; as  well as current illness w/ COVID19 pna - pt is on 10L of O2. These factors along w/ advanced age (89) can impact her overall awareness/timing of swallow and safety during po tasks which increases risk for aspiration, choking. Pt's risk for aspiration is present but can be reduced when following strict aspiration precautions during oral intake. Will monitor for any negative sequelae from potential aspiration; monitor for need to modify liquids in diet while admitted.   Unsure of pt's Baseline Cognitive status -- Family present indicated "some" Cognitive decline. ANY decline in Cognition can impact oropharyngeal phase swallowing. She required min verbal/visual cues for during po tasks d/t distraction. Pt is HOH.       Pt consumed several trials of ice chips, purees, soft solids and thin liquids w/ overt clinical s/s of aspiration noted x1 w/ trial of thin liquids (via CUP). No further was noted after the first attempt/trial. Also encouraged pt to NOT TALK during/immeidately after oral intake. This was challenging d/t Cognitive decline; decreased awareness. No further overt clinical s/s of aspiration noted w/ remaining consistencies; no decline in vocal quality; no cough, and no decline in respiratory status during/post trials. Oral phase was adequate for bolus management and oral clearing of the boluses given. Mastication of soft solids appropriate given min Time -- moistened trials well. Pt self-fed but required Min support and guidance w/ trials. Dtr stated she cuts up pt's foods at home. She held her own Cup during drinking which improves safety of swallowing.  OM Exam appeared [facility] w/ No unilateral weakness noted. Some confusion of OM tasks and oral care.         D/t pt's Baseline, declined Cognitive status and her risk for aspiration currently, recommend initiation of the dysphagia level 3(mech soft) w/ thin liquids via Cup -- NO STRAWS; aspiration precautions; reduce Distractions during meals and engage pt  during po's at meal for self-feeding. NO TALKING w/ po's. Must be sitting fully upright w/ head forward for any po's. Eat/drink SLOWLY. Pills Crushed in Puree for safer swallowing. Support w/ feeding at meals if needed. MD/NSG updated. ST services recommends follow w/ Palliative Care for [facility] and education re: impact of Cognitive decline/advanced age on swallowing. ST services will monitor status while admitted and adjust diet as indicated. SLP Visit Diagnosis: Dysphagia, pharyngeal phase (R13.13) (baseline Cognitive decline)    Aspiration Risk  Mild aspiration risk;Risk for inadequate nutrition/hydration    Diet Recommendation  dysphagia level 3(mech soft) w/ thin liquids via Cup -- NO STRAWS; aspiration precautions; reduce Distractions during meals and engage pt during po's at meal for self-feeding. NO TALKING w/ po's. Must be sitting fully upright w/ head forward for any po's. Eat/drink SLOWLY.  Medication Administration: Crushed with puree    Other  Recommendations Recommended Consults:  (Dietician f/u; Palliative care f/u) Oral Care Recommendations: Oral care BID;Oral care before and after PO;Staff/trained caregiver to provide oral care Other Recommendations:  (n/a currently)    Recommendations for follow up therapy are one component of a multi-disciplinary discharge planning process, led by the attending physician.  Recommendations may be updated based on patient status, additional functional criteria and insurance authorization.  Follow up Recommendations  (TBD)      Frequency and Duration min 2x/week  2 weeks       Prognosis Prognosis for Safe Diet Advancement: Fair Barriers to Reach Goals: Cognitive deficits;Time post onset;Severity of deficits Barriers/Prognosis Comment: Cognitive decline per [place]      Swallow Study   General Date of Onset: 11/24/2020 HPI: Pt presented to ER SOB, congestion; admitted for management of acute/chronic respiratory, sepsis due to COVID-19 PNA.  Per  Imaging: CT of Chest -- "Spiculated left upper lobe 3.2 cm lung mass concerning for  carcinoma. There is an additional smaller spiculated suspicious  nodule in the right upper lobe measuring 11 mm.  2. Cardiomegaly with small right greater than left pleural  effusions. Lower lobe bronchial wall thickening with mild mucous  plugging in right lower lobe. Ground-glass density and patchy  consolidation in the right lower lobe and right middle lobe; mediastinal lymph nodes measuring up to 8 mm in the precarinal  space. Esophagus within normal limits.".  She does Not have any consistent Imaging suggestive of chronic pneumonia prior to this admit.  PMH: Depression      Hypertension     Myelofibrosis ([place])     SAH (subarachnoid hemorrhage) ([place]) 02/2006   Tobacco abuse.  Anuerysm.  Per Family report, pt has Cognitive decline baseline. Type of Study: Bedside Swallow Evaluation Previous Swallow Assessment: none Diet Prior to this Study: Regular;Thin liquids Temperature Spikes Noted: No (wbc 12.4) Respiratory Status: Nasal cannula (10L) History of Recent Intubation: No Behavior/Cognition: Alert;Cooperative;Pleasant mood;Confused;Distractible;Requires cueing (Cognitive decline per Family) Oral Cavity Assessment: Within Functional Limits Oral Care Completed by SLP: Yes Oral Cavity - Dentition: Adequate natural dentition;Missing dentition (few) Vision: Functional for self-feeding Self-Feeding Abilities: Able to feed self;Needs set up;Needs assist (distracted at times) Patient Positioning: Upright in chair Baseline Vocal Quality: Normal;Low  vocal intensity (mild) Volitional Cough: Congested Volitional Swallow: Able to elicit    Oral/Motor/Sensory Function Overall Oral Motor/Sensory Function: Within functional limits   Ice Chips Ice chips: Within functional limits Presentation: Spoon (fed; 3 trials)   Thin Liquid Thin Liquid: Impaired (mild; inconsistent) Presentation: Cup;Self Fed (7 trials) Oral Phase  Impairments:  (adequate) Pharyngeal  Phase Impairments: Cough - Immediate (x1/7) Other Comments: Dtr reported no coughing she has noted at home    Nectar Thick Nectar Thick Liquid: Not tested   Honey Thick Honey Thick Liquid: Not tested   Puree Puree: Within functional limits Presentation: Self Fed;Spoon (5 trials)   Solid     Solid: Within functional limits (softened) Presentation: Self Fed (supported; 3 trials)         Orinda Kenner, MS, [place] Speech Language Pathologist Rehab Services [phone] [place] 11/28/2020,1:26 PM

## 2020-11-28 NOTE — Evaluation (Signed)
Physical Therapy Evaluation Patient Details Name: [name] MRN: [id number] DOB: 08-12-1928 Today's Date: 11/28/2020  History of Present Illness  presented to ER SOB, congestion; admitted for management of acute/chronic respiratory, sepsis due to COVID-19 PNA, new lung mass (L UL)  Clinical Impression  Patient resting in bed upon arrival to session; alert and oriented to self, location.  Follows simple commands, but does demonstrate mild/mod confusion at times.  Speech generally garbled and difficult to understand at times; improved with cuing for slower, more deliberate speech.  Patient globally weak and deconditioned throughout all extremities; no focal weakness appreciated.  Currently requiring mod assist for bed mobility; min assist for sit/stand and bed/chair transfer with single HHA.  Requires step by step cuing for direction, recall of task and visual scanning to R during transfer.  Generally unsteady and unsafe to complete without +1 physical assist at all times. Of note, resting at 84-85% on 10L upon arrival to room; recovers to 89-90% with pursed lip breathing and O2 increased to 12L.  On 12L, desat to 84-85% with minimal exertion; requiring extended seated rest period and pursed lip breathing efforts for recovery >89-90% end of session.  Additional gait/activity deferred as result.  RN informed/aware. Would benefit from skilled PT to address above deficits and promote optimal return to PLOF.; recommend transition to STR upon discharge from acute hospitalization, pending palliative consult and established goals of care.      Recommendations for follow up therapy are one component of a multi-disciplinary discharge planning process, led by the attending physician.  Recommendations may be updated based on patient status, additional functional criteria and insurance authorization.  Follow Up Recommendations SNF (pending goals of care)    Equipment Recommendations        Recommendations for Other Services       Precautions / Restrictions Precautions Precautions: Fall Restrictions Weight Bearing Restrictions: No      Mobility  Bed Mobility Overal bed mobility: Needs Assistance Bed Mobility: Supine to Sit     Supine to sit: Min assist;Mod assist     General bed mobility comments: assist for truncal elevation and scooting to edge of bed    Transfers Overall transfer level: Needs assistance Equipment used: 1 person hand held assist Transfers: Sit to/from Stand;Stand Pivot Transfers Sit to Stand: Min assist Stand pivot transfers: Min assist       General transfer comment: step by step cuing for direction, recall of task and visual scanning to R during transfer  Ambulation/Gait             General Gait Details: unsafe/unable due to respiratory status  Stairs            Wheelchair Mobility    Modified Rankin (Stroke Patients Only)       Balance Overall balance assessment: Needs assistance Sitting-balance support: No upper extremity supported;Feet supported Sitting balance-Leahy Scale: Fair     Standing balance support: Single extremity supported Standing balance-Leahy Scale: Poor                               Pertinent Vitals/Pain Pain Assessment: No/denies pain    Home Living Family/patient expects to be discharged to:: Private residence Living Arrangements: Children Available Help at Discharge: Family;Available 24 hours/day Type of Home: House Home Access: Stairs to enter   [employer] of Steps: 1 (to den) Home Layout: One level Home Equipment: Environmental consultant - 4 wheels  Prior Function Level of Independence: Needs assistance         Comments: Assist for OOB/ADLs, in AM; supervision with 4WRW for limited household distances.  No home O2; denies fall history.     Hand Dominance   Dominant Hand: Right    Extremity/Trunk Assessment   Upper Extremity Assessment Upper  Extremity Assessment: Generalized weakness (grossly 3/5 throughout, mild/mod inattention to R at times)    Lower Extremity Assessment Lower Extremity Assessment: Generalized weakness (grossly 3/5 throughout; marked discoloration to LEs, mid-calf distally)       Communication   Communication: HOH  Cognition Arousal/Alertness: Awake/alert Behavior During Therapy: Flat affect                                   General Comments: Alert and oriented to self, general location; follows simple commands, but mild/mod intermittent confusion noted at times.  Speech generally garbled and difficult to understand at times.      General Comments      Exercises Other Exercises Other Exercises: Educated in role of PT and progressive mobility, importance of upright/OOB positioning, pursed lip breathing techniques.  Patient voiced understanding, returns demonstration, but with max cuing from therapist to integrate.   Assessment/Plan    PT Assessment Patient needs continued PT services  PT Problem List Decreased strength;Decreased activity tolerance;Decreased balance;Decreased mobility;Decreased coordination;Decreased cognition;Decreased knowledge of use of DME;Decreased safety awareness;Decreased knowledge of precautions;Cardiopulmonary status limiting activity       PT Treatment Interventions Functional mobility training;Gait training;DME instruction;Therapeutic activities;Therapeutic exercise;Balance training;Patient/family education    PT Goals (Current goals can be found in the Care Plan section)  Acute Rehab PT Goals Patient Stated Goal: OOB to chair PT Goal Formulation: With patient/family Time For Goal Achievement: 12/12/20 Potential to Achieve Goals: Fair    Frequency Min 2X/week   Barriers to discharge        Co-evaluation               AM-PAC PT "6 Clicks" Mobility  Outcome Measure Help needed turning from your back to your side while in a flat bed without  using bedrails?: A Little Help needed moving from lying on your back to sitting on the side of a flat bed without using bedrails?: A Lot Help needed moving to and from a bed to a chair (including a wheelchair)?: A Little Help needed standing up from a chair using your arms (e.g., wheelchair or bedside chair)?: A Little Help needed to walk in hospital room?: A Lot Help needed climbing 3-5 steps with a railing? : A Lot 6 Click Score: 15    End of Session Equipment Utilized During Treatment: Oxygen Activity Tolerance: Treatment limited secondary to medical complications (Comment) (desat with minimal activity) Patient left: in chair;with call bell/phone within reach;with family/visitor present (alarm box/cords not available in room; RN informed/aware. Brother at bedside and to call for assist from staff as needed) Nurse Communication: Mobility status PT Visit Diagnosis: Difficulty in walking, not elsewhere classified (R26.2);Muscle weakness (generalized) (M62.81)    Time: 4818-5631 PT Time Calculation (min) (ACUTE ONLY): 30 min   Charges:   PT Evaluation $PT Eval Moderate Complexity: 1 Mod PT Treatments $Therapeutic Activity: 8-22 mins       Aaron Bostwick H. Owens Shark, PT, DPT, NCS 11/28/20, 11:34 AM [phone]

## 2020-11-28 NOTE — Progress Notes (Signed)
Called to patient's room secondary to drop in saturation on 6l cool HFNC, RN increased flow to 10liters, patient is now saturation of 92%

## 2020-11-29 ENCOUNTER — Inpatient Hospital Stay: Payer: Medicare Other

## 2020-11-29 ENCOUNTER — Encounter: Payer: Self-pay | Admitting: Internal Medicine

## 2020-11-29 DIAGNOSIS — A4189 Other specified sepsis: Secondary | ICD-10-CM | POA: Diagnosis not present

## 2020-11-29 DIAGNOSIS — U071 COVID-19: Secondary | ICD-10-CM | POA: Diagnosis not present

## 2020-11-29 LAB — COMPREHENSIVE METABOLIC PANEL
ALT: 19 U/L (ref 0–44)
AST: 26 U/L (ref 15–41)
Albumin: 3 g/dL — ABNORMAL LOW (ref 3.5–5.0)
Alkaline Phosphatase: 47 U/L (ref 38–126)
Anion gap: 8 (ref 5–15)
BUN: 20 mg/dL (ref 8–23)
CO2: 36 mmol/L — ABNORMAL HIGH (ref 22–32)
Calcium: 8.1 mg/dL — ABNORMAL LOW (ref 8.9–10.3)
Chloride: 102 mmol/L (ref 98–111)
Creatinine, Ser: 0.58 mg/dL (ref 0.44–1.00)
GFR, Estimated: >60 mL/min (ref >60)
Glucose, Bld: 113 mg/dL — ABNORMAL HIGH (ref 70–99)
Potassium: 3.6 mmol/L (ref 3.5–5.1)
Sodium: 146 mmol/L — ABNORMAL HIGH (ref 135–145)
Total Bilirubin: 0.6 mg/dL (ref 0.3–1.2)
Total Protein: 5.6 g/dL — ABNORMAL LOW (ref 6.5–8.1)

## 2020-11-29 LAB — FERRITIN: Ferritin: 125 ng/mL (ref 11–307)

## 2020-11-29 LAB — BLOOD GAS, ARTERIAL
Acid-Base Excess: 9.6 mmol/L — ABNORMAL HIGH (ref 0.0–2.0)
Bicarbonate: 37 mmol/L — ABNORMAL HIGH (ref 20.0–28.0)
Delivery systems: POSITIVE
FIO2: 1
O2 Saturation: 94.9 %
Patient temperature: 37
pCO2 arterial: 67 mmHg (ref 32.0–48.0)
pH, Arterial: 7.35 (ref 7.350–7.450)
pO2, Arterial: 79 mmHg — ABNORMAL LOW (ref 83.0–108.0)

## 2020-11-29 LAB — CBC WITH DIFFERENTIAL/PLATELET
Abs Immature Granulocytes: 1.83 10*3/uL — ABNORMAL HIGH (ref 0.00–0.07)
Basophils Absolute: 0.1 10*3/uL (ref 0.0–0.1)
Basophils Relative: 0 %
Eosinophils Absolute: 0 10*3/uL (ref 0.0–0.5)
Eosinophils Relative: 0 %
HCT: 31.4 % — ABNORMAL LOW (ref 36.0–46.0)
Hemoglobin: 9.2 g/dL — ABNORMAL LOW (ref 12.0–15.0)
Immature Granulocytes: 16 %
Lymphocytes Relative: 4 %
Lymphs Abs: 0.4 10*3/uL — ABNORMAL LOW (ref 0.7–4.0)
MCH: 30.3 pg (ref 26.0–34.0)
MCHC: 29.3 g/dL — ABNORMAL LOW (ref 30.0–36.0)
MCV: 103.3 fL — ABNORMAL HIGH (ref 80.0–100.0)
Monocytes Absolute: 0.8 10*3/uL (ref 0.1–1.0)
Monocytes Relative: 7 %
Neutro Abs: 8.6 10*3/uL — ABNORMAL HIGH (ref 1.7–7.7)
Neutrophils Relative %: 73 %
Platelets: 125 10*3/uL — ABNORMAL LOW (ref 150–400)
RBC Morphology: 5
RBC: 3.04 MIL/uL — ABNORMAL LOW (ref 3.87–5.11)
RDW: 21.8 % — ABNORMAL HIGH (ref 11.5–15.5)
WBC: 11.6 10*3/uL — ABNORMAL HIGH (ref 4.0–10.5)
nRBC: 6.4 % — ABNORMAL HIGH (ref 0.0–0.2)

## 2020-11-29 LAB — GLUCOSE, CAPILLARY: Glucose-Capillary: 129 mg/dL — ABNORMAL HIGH (ref 70–99)

## 2020-11-29 LAB — MAGNESIUM: Magnesium: 1.5 mg/dL — ABNORMAL LOW (ref 1.7–2.4)

## 2020-11-29 LAB — D-DIMER, QUANTITATIVE: D-Dimer, Quant: 1.55 ug/mL-FEU — ABNORMAL HIGH (ref 0.00–0.50)

## 2020-11-29 LAB — C-REACTIVE PROTEIN: CRP: 13.8 mg/dL — ABNORMAL HIGH (ref <1.0)

## 2020-11-29 MED ORDER — MAGNESIUM SULFATE 4 GM/100ML IV SOLN
4.0000 g | Freq: Once | INTRAVENOUS | Status: AC
  Administered 2020-11-29: 4 g via INTRAVENOUS
  Filled 2020-11-29: qty 100

## 2020-11-29 MED ORDER — CHLORHEXIDINE GLUCONATE CLOTH 2 % EX PADS
6.0000 | MEDICATED_PAD | Freq: Every day | CUTANEOUS | Status: DC
  Administered 2020-11-30: 6 via TOPICAL

## 2020-11-29 MED ORDER — MORPHINE SULFATE (PF) 2 MG/ML IV SOLN
0.5000 mg | Freq: Four times a day (QID) | INTRAVENOUS | Status: DC | PRN
  Administered 2020-11-30 – 2020-12-02 (×8): 0.5 mg via INTRAVENOUS
  Filled 2020-11-29 (×8): qty 1

## 2020-11-29 MED ORDER — MORPHINE SULFATE (PF) 2 MG/ML IV SOLN
1.0000 mg | Freq: Four times a day (QID) | INTRAVENOUS | Status: DC | PRN
  Administered 2020-11-29: 1 mg via INTRAVENOUS

## 2020-11-29 MED ORDER — MORPHINE SULFATE (PF) 2 MG/ML IV SOLN
INTRAVENOUS | Status: AC
  Filled 2020-11-29: qty 1

## 2020-11-29 MED ORDER — FUROSEMIDE 10 MG/ML IJ SOLN
INTRAMUSCULAR | Status: AC
  Administered 2020-11-29: 40 mg via INTRAVENOUS
  Filled 2020-11-29: qty 4

## 2020-11-29 MED ORDER — MORPHINE SULFATE (PF) 2 MG/ML IV SOLN: 1.0000 mg | Freq: Four times a day (QID) | INTRAVENOUS | Status: DC

## 2020-11-29 MED ORDER — FUROSEMIDE 10 MG/ML IJ SOLN
40.0000 mg | Freq: Once | INTRAMUSCULAR | Status: AC
  Administered 2020-11-29: 40 mg via INTRAVENOUS

## 2020-11-29 NOTE — Progress Notes (Signed)
Pulmonary Medicine          Date: 11/29/2020,   MRN# [id number] [name] 02/08/1928     AdmissionWeight: 44.5 kg                 CurrentWeight: 45.9 kg    Referrng physician: Dr Arbutus Ped  CHIEF COMPLAINT:   Mass of left lung   HISTORY OF PRESENT ILLNESS   [name] is a 87 y.o. female with medical history significant of COPD, depression, myelofibrosis, history of subarachnoid hemorrhage, history of tobacco abuse, left eye blindness, hard of hearing, history of anterior cerebral artery aneurysm, essential hypertension among other things who was apparently sent over from [place] [facility] with congestion, of shortness of breath.  She was found to have COVID19 + She was initially assisted with oxygen sats in the 60s.  After warming up in the ER her oxygen sat was 79% on room air.  Patient placed on oxygen.  She is talking in complete sentences but seem to have exertional dyspnea.  Associated with some fever.  Patient also has some basal crackles with some mild pedal edema.  Work-up showed that patient meets sepsis criteria with a white count of 16.2.  Some tachycardia as well as lactic acid elevated.  She also has hypoxia with oxygen sats 79% on room air.   Temperature is 98.6, blood pressure 178/67, pulse 59, respirate of 19 oxygen sats 79% on room air.  White count 16.2 hemoglobin 10.7 platelets 153.  Sodium 141 potassium 3.4 chloride 105 CO2 is 28 BUN 19 creatinine 0.86 and calcium 8.2.  Troponin is flat at 30.  BNP of 639.  Procalcitonin 0.18.  COVID-19 screen is positive but influenza negative.  Urinalysis currently pending.  Chest x-ray showed nodular opacity in the left upper lobe possibly mass versus mild patchy right lower lobe opacity also trace pleural effusion.  CT chest with contrast showed spiculated left upper lobe 3.2 cm lung mass concerning for carcinoma.  Also additional smaller 1 in the right upper lobe about 11 mm.  There is cardiomegaly with small  right greater than left pleural effusions.  Groundglass density and patchy consolidation in the right lower lobe and right middle lobe possibly aspiration pneumonia.  Absent emphysema.  PCCM consultation for additional guidance regarding lung mass. I met with brother of patient and we discussed findings together with patient. We discussed that its probably safest to have full scope of therapy for COVID prior to biopsy of any kind.    11/27/20- no overnight events, will sign off until in chronic stable state for cancer workup on outpatient.    11/29/20- [name] (sister of patient) came in to visit, Dr Jasmine Pang and I meth with [name] we discussed care plan, today we weaned FiO2 to 40% from 50%.   PAST MEDICAL HISTORY   Past Medical History:  Diagnosis Date   Aneurysm of anterior cerebral artery    Blindness of left eye    secondary to cataract surgery complication   Depression    Hypertension    Myelofibrosis (HCC)    SAH (subarachnoid hemorrhage) ([place]) 02/2006   Tobacco abuse      SURGICAL HISTORY   Past Surgical History:  Procedure Laterality Date   ABDOMINAL HYSTERECTOMY     ANEURYSM COILING     CATARACT EXTRACTION     CHOLECYSTECTOMY       FAMILY HISTORY   Family History  Problem Relation Age of Onset  Breast cancer Daughter 65   Breast cancer Sister    Throat cancer Mother    Lung cancer Father    Prostate cancer Brother    Leukemia Brother    Prostate cancer Brother    Leukemia Brother      SOCIAL HISTORY   Social History   Tobacco Use   Smoking status: Former    Types: Cigarettes    Quit date: 07/31/2015    Years since quitting: 5.3   Smokeless tobacco: Never  Vaping Use   Vaping Use: Never used  Substance Use Topics   Alcohol use: Yes    Alcohol/week: 1.0 standard drink    Types: 1 Glasses of wine per week    Comment: once a month   Drug use: No     MEDICATIONS    Home Medication:  REM   Current Medication:  Current  Facility-Administered Medications:    amLODipine (NORVASC) tablet 5 mg, 5 mg, Oral, Daily, Nicole Kindred A, DO, 5 mg at 11/28/20 7829   azithromycin (ZITHROMAX) 500 mg in sodium chloride 0.9 % 250 mL IVPB, 500 mg, Intravenous, Q24H, Nena Polio, MD, Last Rate: 250 mL/hr at 11/28/20 2229, 500 mg at 11/28/20 2229   cefTRIAXone (ROCEPHIN) 1 g in sodium chloride 0.9 % 100 mL IVPB, 1 g, Intravenous, Q24H, Belue, Alver Sorrow, RPH, Last Rate: 200 mL/hr at 11/28/20 2146, 1 g at 11/28/20 2146   Chlorhexidine Gluconate Cloth 2 % PADS 6 each, 6 each, Topical, Daily, Nicole Kindred A, DO   chlorpheniramine-HYDROcodone (TUSSIONEX) 10-8 MG/5ML suspension 5 mL, 5 mL, Oral, Q12H PRN, Elwyn Reach, MD, 5 mL at 11/27/20 1940   citalopram (CELEXA) tablet 20 mg, 20 mg, Oral, Daily, Nicole Kindred A, DO, 20 mg at 11/28/20 5621   dexamethasone (DECADRON) injection 6 mg, 6 mg, Intravenous, Q24H, Jonelle Sidle, Mohammad L, MD, 6 mg at 11/29/20 0534   enoxaparin (LOVENOX) injection 30 mg, 30 mg, Subcutaneous, Q24H, Garba, Mohammad L, MD, 30 mg at 11/29/20 0819   guaiFENesin-dextromethorphan (ROBITUSSIN DM) 100-10 MG/5ML syrup 10 mL, 10 mL, Oral, Q4H PRN, Jonelle Sidle, Mohammad L, MD   haloperidol lactate (HALDOL) injection 2 mg, 2 mg, Intravenous, Q6H PRN, Nicole Kindred A, DO, 2 mg at 11/28/20 1617   Ipratropium-Albuterol (COMBIVENT) respimat 1 puff, 1 puff, Inhalation, Q6H, Garba, Mohammad L, MD, 1 puff at 11/28/20 1420   lisinopril (ZESTRIL) tablet 30 mg, 30 mg, Oral, Daily, Nicole Kindred A, DO, 30 mg at 11/28/20 0935   ondansetron (ZOFRAN) tablet 4 mg, 4 mg, Oral, Q6H PRN **OR** ondansetron (ZOFRAN) injection 4 mg, 4 mg, Intravenous, Q6H PRN, Jonelle Sidle, Mohammad L, MD   pantoprazole (PROTONIX) EC tablet 40 mg, 40 mg, Oral, Daily, Nicole Kindred A, DO, 40 mg at 11/28/20 3086   polyethylene glycol (MIRALAX / GLYCOLAX) packet 17 g, 17 g, Oral, Daily, Nicole Kindred A, DO, 17 g at 11/28/20 5784   [COMPLETED] remdesivir 200 mg in  sodium chloride 0.9% 250 mL IVPB, 200 mg, Intravenous, Once, Stopped at 11/26/20 0329 **FOLLOWED BY** remdesivir 100 mg in sodium chloride 0.9 % 100 mL IVPB, 100 mg, Intravenous, Daily, Renda Rolls, RPH, Last Rate: 200 mL/hr at 11/28/20 1328, Infusion Verify at 11/28/20 1328   senna (SENOKOT) tablet 8.6 mg, 1 tablet, Oral, QHS, Nicole Kindred A, DO, 8.6 mg at 11/27/20 2325    ALLERGIES   Patient has no known allergies.     REVIEW OF SYSTEMS    Review of Systems:  Gen:  Denies  fever, sweats, chills weigh loss  HEENT: Denies blurred vision, double vision, ear pain, eye pain, hearing loss, nose bleeds, sore throat Cardiac:  No dizziness, chest pain or heaviness, chest tightness,edema Resp:   Denies cough or sputum porduction, shortness of breath,wheezing, hemoptysis,  Gi: Denies swallowing difficulty, stomach pain, nausea or vomiting, diarrhea, constipation, bowel incontinence Gu:  Denies bladder incontinence, burning urine Ext:   Denies Joint pain, stiffness or swelling Skin: Denies  skin rash, easy bruising or bleeding or hives Endoc:  Denies polyuria, polydipsia , polyphagia or weight change Psych:   Denies depression, insomnia or hallucinations   Other:  All other systems negative   VS: BP (!) 131/56   Pulse 69   Temp 97.9 F (36.6 C) (Axillary)   Resp (!) 23   Ht 5' (1.524 m)   Wt 45.9 kg   SpO2 97%   BMI 19.76 kg/m      PHYSICAL EXAM    GENERAL:NAD, no fevers, chills, no weakness no fatigue HEAD: Normocephalic, atraumatic.  EYES: Pupils equal, round, reactive to light. Extraocular muscles intact. No scleral icterus.  MOUTH: Moist mucosal membrane. Dentition intact. No abscess noted.  EAR, NOSE, THROAT: Clear without exudates. No external lesions.  NECK: Supple. No thyromegaly. No nodules. No JVD.  PULMONARY: Mild rhonchi bilaterally  CARDIOVASCULAR: S1 and S2. Regular rate and rhythm. No murmurs, rubs, or gallops. No edema. Pedal pulses 2+ bilaterally.   GASTROINTESTINAL: Soft, nontender, nondistended. No masses. Positive bowel sounds. No hepatosplenomegaly.  MUSCULOSKELETAL: No swelling, clubbing, or edema. Range of motion full in all extremities.  NEUROLOGIC: Cranial nerves II through XII are intact. No gross focal neurological deficits. Sensation intact. Reflexes intact.  SKIN: No ulceration, lesions, rashes, or cyanosis. Skin warm and dry. Turgor intact.  PSYCHIATRIC: Mood, affect within normal limits. The patient is awake, alert and oriented x 3. Insight, judgment intact.       IMAGING    DG Chest 1 View  Result Date: 11/29/2020 CLINICAL DATA:  Hypoxia. EXAM: CHEST  1 VIEW COMPARISON:  Chest radiograph dated 11/28/2020. CT dated 11/20/2020 FINDINGS: A 3.5 x 2.8 cm ovoid opacity in the left mid lung field corresponds to the mass seen on the prior CT. There is background of emphysema. Diffuse interstitial coarsening and patchy density in the right upper lobe. Small bilateral pleural effusions and bibasilar atelectasis or infiltrate. Stable cardiac enlargement. Atherosclerotic calcification of the aorta. No pneumothorax. No acute osseous pathology. IMPRESSION: 1. A 3.5 x 2.8 cm ovoid opacity in the left mid lung field corresponds to the mass seen on the prior CT. 2. Small bilateral pleural effusions and bibasilar atelectasis or infiltrate. 3. Increased diffuse interstitial prominence and airspace density throughout the right lung. Electronically Signed   By: Anner Crete M.D.   On: 11/29/2020 00:55   DG Chest 2 View  Result Date: 11/14/2020 CLINICAL DATA:  Shortness of breath EXAM: CHEST - 2 VIEW COMPARISON:  04/28/2020 FINDINGS: Nodular opacity in the left upper lobe, possibly associated with the anterior left 3rd rib. However, if pulmonary, this would be suspicious for an mass. Mild patchy right lower lobe opacity, chronic, likely atelectasis versus scarring. Trace left pleural effusion. The heart is top-normal in size.  Thoracic aortic  atherosclerosis. IMPRESSION: Nodular opacity in the left upper lobe, possibly osseous, mass not excluded. Consider CT chest with contrast for further evaluation lobe. Mild patchy right lower lobe opacity, favoring atelectasis versus scarring. Trace left pleural effusion. Electronically Signed   By: Henderson Newcomer.D.  On: 11/27/2020 19:39   CT Chest W Contrast  Result Date: 12/01/2020 CLINICAL DATA:  Abnormal chest x-ray EXAM: CT CHEST WITH CONTRAST TECHNIQUE: Multidetector CT imaging of the chest was performed during intravenous contrast administration. CONTRAST:  50m OMNIPAQUE IOHEXOL 300 MG/ML  SOLN COMPARISON:  Chest x-ray 11/17/2020, 07/11/2016, CT abdomen 12/04/2018 FINDINGS: Cardiovascular: Advanced aortic atherosclerosis. No aneurysmal dilatation. No dissection seen. Cardiomegaly. Coronary vascular calcification. No pericardial effusion Mediastinum/Nodes: Midline trachea. No thyroid mass. Nonspecific mediastinal lymph nodes measuring up to 8 mm in the precarinal space. Esophagus within normal limits. Lungs/Pleura: Emphysema. Small right greater than left pleural effusions. 3.2 by 2.7 by 2.2 cm spiculated left upper lobe lung mass. Smaller slightly spiculated right upper lobe lung nodule measuring 11 by 10 by 10 mm, series 3, image 63. Bilateral lower lobe bronchial wall thickening with mild mucous plugging in the right lower lobe. Patchy consolidation in the right lower lobe and right middle lobe. No visible pneumothorax. Upper Abdomen: Incompletely visualized cyst exophytic to the upper pole right kidney. Adrenal gland adenomas. Incompletely visualized spleen appears enlarged. Musculoskeletal: No acute osseous abnormality. Chronic compression fracture at L1 IMPRESSION: 1. Spiculated left upper lobe 3.2 cm lung mass concerning for carcinoma. There is an additional smaller spiculated suspicious nodule in the right upper lobe measuring 11 mm. 2. Cardiomegaly with small right greater than left  pleural effusions. Lower lobe bronchial wall thickening with mild mucous plugging in right lower lobe. Ground-glass density and patchy consolidation in the right lower lobe and right middle lobe potentially due to aspiration or pneumonia. 3. Emphysema 4. Incompletely visualized splenomegaly.  Adrenal gland adenomas. Aortic Atherosclerosis (ICD10-I70.0) and Emphysema (ICD10-J43.9). Electronically Signed   By: KDonavan FoilM.D.   On: 11/26/2020 22:09   ECHOCARDIOGRAM COMPLETE  Result Date: 11/26/2020    ECHOCARDIOGRAM REPORT   Patient Name:   MSUHANI STILLIONDate of Exam: 11/26/2020 Medical Rec #:  [id number]         Height:       60.0 in Accession #:    [id number]        Weight:       98.0 lb Date of Birth:  [birth year]         BSA:          1.378 m Patient Age:    89years           BP:           139/57 mmHg Patient Gender: F                  HR:           83 bpm. Exam Location:  [facility] Procedure: 2D Echo, Cardiac Doppler and Color Doppler Indications:     CHF-acute diastolic IC48.88 History:         Patient has prior history of Echocardiogram examinations, most                  recent 04/29/2020. Risk Factors:Hypertension. Tobacco abuse.  Sonographer:     JSherrie SportReferring Phys:  2RosendaleDiagnosing Phys: TIda RogueMD IMPRESSIONS  1. Left ventricular ejection fraction, by estimation, is 55 to 60%. The left ventricle has normal function. The left ventricle has no regional wall motion abnormalities. Left ventricular diastolic parameters are indeterminate.  2. Right ventricular systolic function is low normal. The right ventricular size is severely enlarged. There is moderately elevated pulmonary artery systolic pressure. The estimated  right ventricular systolic pressure is 58.5 mmHg.  3. Left atrial size was mildly dilated.  4. Right atrial size was moderately dilated.  5. The mitral valve is normal in structure. No evidence of mitral valve regurgitation. No evidence of mitral stenosis.   6. Tricuspid valve regurgitation is moderate.  7. The aortic valve was not well visualized. Aortic valve regurgitation is mild to moderate. Mild to moderate aortic valve sclerosis/calcification is present, without any evidence of aortic stenosis.  8. The inferior vena cava is normal in size with greater than 50% respiratory variability, suggesting right atrial pressure of 3 mmHg.  9. Large left pleural effusion noted FINDINGS  Left Ventricle: Left ventricular ejection fraction, by estimation, is 55 to 60%. The left ventricle has normal function. The left ventricle has no regional wall motion abnormalities. The left ventricular internal cavity size was normal in size. There is  no left ventricular hypertrophy. Left ventricular diastolic parameters are indeterminate. Right Ventricle: The right ventricular size is severely enlarged. No increase in right ventricular wall thickness. Right ventricular systolic function is low normal. There is moderately elevated pulmonary artery systolic pressure. The tricuspid regurgitant velocity is 3.60 m/s, and with an assumed right atrial pressure of 5 mmHg, the estimated right ventricular systolic pressure is 27.7 mmHg. Left Atrium: Left atrial size was mildly dilated. Right Atrium: Right atrial size was moderately dilated. Pericardium: There is no evidence of pericardial effusion. Mitral Valve: The mitral valve is normal in structure. There is mild thickening of the mitral valve leaflet(s). Mild mitral annular calcification. No evidence of mitral valve regurgitation. No evidence of mitral valve stenosis. Tricuspid Valve: The tricuspid valve is normal in structure. Tricuspid valve regurgitation is moderate . No evidence of tricuspid stenosis. Aortic Valve: The aortic valve was not well visualized. Aortic valve regurgitation is mild to moderate. Aortic regurgitation PHT measures 414 msec. Mild to moderate aortic valve sclerosis/calcification is present, without any evidence of aortic  stenosis.  Aortic valve mean gradient measures 3.0 mmHg. Aortic valve peak gradient measures 6.7 mmHg. Aortic valve area, by VTI measures 4.11 cm. Pulmonic Valve: The pulmonic valve was normal in structure. Pulmonic valve regurgitation is not visualized. No evidence of pulmonic stenosis. Aorta: The aortic root is normal in size and structure. Venous: The inferior vena cava is normal in size with greater than 50% respiratory variability, suggesting right atrial pressure of 3 mmHg. IAS/Shunts: No atrial level shunt detected by color flow Doppler.  LEFT VENTRICLE PLAX 2D LVIDd:         5.10 cm   Diastology LVIDs:         2.90 cm   LV e' medial:  6.85 cm/s LV PW:         1.20 cm   LV e' lateral: 11.00 cm/s LV IVS:        0.90 cm LVOT diam:     2.00 cm LV SV:         90 LV SV Index:   65 LVOT Area:     3.14 cm  RIGHT VENTRICLE RV S prime:     11.90 cm/s TAPSE (M-mode): 4.6 cm LEFT ATRIUM              Index        RIGHT ATRIUM           Index LA diam:        3.90 cm  2.83 cm/m   RA Area:     26.60 cm LA Vol (A2C):  102.0 ml 74.00 ml/m  RA Volume:   97.30 ml  70.59 ml/m LA Vol (A4C):   67.4 ml  48.90 ml/m LA Biplane Vol: 82.6 ml  59.92 ml/m  AORTIC VALVE                     PULMONIC VALVE AV Area (Vmax):    3.92 cm      PV Vmax:        0.63 m/s AV Area (Vmean):   4.63 cm      PV Peak grad:   1.6 mmHg AV Area (VTI):     4.11 cm      RVOT Peak grad: 3 mmHg AV Vmax:           129.00 cm/s AV Vmean:          75.300 cm/s AV VTI:            0.218 m AV Peak Grad:      6.7 mmHg AV Mean Grad:      3.0 mmHg LVOT Vmax:         161.00 cm/s LVOT Vmean:        111.000 cm/s LVOT VTI:          0.285 m LVOT/AV VTI ratio: 1.31 AI PHT:            414 msec  AORTA Ao Root diam: 3.30 cm TRICUSPID VALVE TR Peak grad:   51.8 mmHg TR Vmax:        360.00 cm/s  SHUNTS Systemic VTI:  0.29 m Systemic Diam: 2.00 cm Ida Rogue MD Electronically signed by Ida Rogue MD Signature Date/Time: 11/26/2020/8:49:32 PM    Final        ASSESSMENT/PLAN   Left lung mass  3.2 cm spiculated left upper lobe lung mass.  -will evaluate via PET post dc   Right upper lobe lung nodule slightly spiculated right upper lobe lung nodule measuring 11 by 10 by 10 mm, series 3, image 63 -will evaluate post full treatment for COVID19   Acute COVID19 pneumonia -Remdesevir antiviral - pharmacy protocol 5 d -vitamin C -zinc -decadron 50m IV daily  -encourage to use IS and Acapella device for bronchopulmonary hygiene when able -d/c hepatotoxic medications while on remdesevir -supportive care with elemetry monitoring -PT/OT when possible -procalcitonin, CRP and ferritin trending   Bilateral pleural effusions  Dc fluids   Thank you for allowing me to participate in the care of this patient.  Total face to face encounter time for this patient visit was >457m. >50% of the time was  spent in counseling and coordination of care.   Patient/Family are satisfied with care plan and all questions have been answered.  This document was prepared using Dragon voice recognition software and may include unintentional dictation errors.     FuOttie GlazierM.D.  Division of [facility]

## 2020-11-29 NOTE — Progress Notes (Signed)
PROGRESS NOTE    [name]   JJO:[id number]  DOB: [birth year]  PCP: Tracie Harrier, MD    DOA: 11/14/2020 LOS: 4    Brief Narrative / Hospital Course to Date:   Per H&P "87 y.o. female with medical history significant of COPD, depression, myelofibrosis, history of subarachnoid hemorrhage, history of tobacco abuse, left eye blindness, hard of hearing, history of anterior cerebral artery aneurysm, essential hypertension among other things who was apparently sent over from [place] [facility] with congestion, of shortness of breath.  She was initially assisted with oxygen sats in the 60s.  After warming up in the ER her oxygen sat was 79% on room air.  Patient placed on oxygen.  She is talking in complete sentences but seem to have exertional dyspnea.  Associated with some fever.  Patient also has some basal crackles with some mild pedal edema.  Work-up showed that patient meets sepsis criteria with a white count of 16.2.  Some tachycardia as well as lactic acid elevated.  She also has hypoxia with oxygen sats 79% on room air.  She has elevated BNP which is new with findings suggestive of congestive heart failure but more importantly patient was found to have COVID-19 positive results with new lung lesions concerning for new malignancy.  She is therefore being admitted to the hospital for further evaluation and treatment.    Patient was found to be positive for Covid-19.  Chest xray and CT chest with contrast showed RML and RLL consolidations consistent with ?aspiration pneumonia and a LUL mass.  Admitted and started on remdesevir, steroids, antibiotics.  Pulmonology consulted.  Assessment & Plan   Principal Problem:   Sepsis due to COVID-19 [facility]) Active Problems:   Acute on chronic respiratory failure with hypoxia (HCC)   COPD (chronic obstructive pulmonary disease) (HCC)   Myelofibrosis (HCC)   HTN (hypertension)   Goals of care, counseling/discussion   Depression   Vascular  dementia without behavioral disturbance ([place])   Pneumonia due to COVID-19 virus   Acute diastolic CHF (congestive heart failure) (HCC)   Hypokalemia   Lung mass   Sepsis due to Covid-19 vs aspiration pneumonia with  Acute respiratory failure with hypoxia and hypercapnea -  Continue remdesivir, steroids and antibiotics Supplement O2, maintain sat > 90%, wean as tolerated. (Pt does not require O2 at baseline). BiPAP as needed Monitor inflammatory markers, CMP, CBC Pulmonary hygiene with I-S and flutter SLP evaluation - see their recommendations PCCM now following  CODE STATUS Discussed 10/21 -  Pt confirmed full code for now.  Wants to discuss with her children.  Encouraged ongoing discussions about whether she would want extraordinary measures or to be placed on ventilator.  Sister at bedside agrees patient should discuss with her children.   --Palliative care consult pending.   Hypernatremia - due to poor PO intake secondary to confusion and acute illness. Na 146 this AM (10/23).  Will start D5 if patient unable to wean off BiPAP today. --Monitor BMP  Hypomagnesemia - replacing 4g IV Mg-sulfate for Mg 1.5 today (10/23).    New LUL lung mass - pulmonology consulted. Dr. Tasia Catchings, patient's oncologist with whom she follows for myelofibrosis therapy has been updated on this finding. --Evaluation on hold until recovered from acute illness  Myelofibrosis - Dr. Tasia Catchings aware of admission, advised to hold patient's Jakafi.  Thrombocytopenia -likely due to acute infection.  Monitor CBC.  Essential hypertension continue home lisinopril and amlodipine.   Hypokalemia: Replaced potassium. Monitor and replace as  needed   COPD with mild exacerbation: Secondary to acute infection.  Continue with steroids and breathing treatments   Vascular dementia: At baseline.  No acute issues or behavioral disturbance noted. Delirium precautions.   ?New onset CHF: Suspected diastolic dysfunction.   Echo  pending - follow up Monitor volume status   Depression with anxiety: continue Celexa  Normocytic anemia: due to chronic disease.  Hbg stable. Follow CBC's  GERD - continue PPI    Patient BMI: Body mass index is 19.76 kg/m.   DVT prophylaxis: enoxaparin (LOVENOX) injection 30 mg Start: 11/26/20 0800   Diet:  Diet Orders (From admission, onward)     Start     Ordered   11/28/20 1308  DIET DYS 3 Room service appropriate? Yes with Assist; Fluid consistency: Thin  Diet effective now       Comments: NO STRAWS!!!  Extra Gravy on chopped meats. CREAM SOUPS.  Question Answer Comment  Room service appropriate? Yes with Assist   Fluid consistency: Thin      11/28/20 1308              Code Status: Full Code   Subjective 11/29/20    Pt yesterday afternoon and evening became agitated.  Overnight she had significant oxygen desaturation was started on BiPAP and transferred to ICU.  This morning, patient sleeping comfortably, remains on BiPAP.  She does open her eyes to voice briefly.  Attempt was made to turn FiO2 from 40% to 30%, patient's O2 sat dropped to 89, FiO2 was increased back to 40%.  Otherwise patient stable this morning.  Sister Freda Munro at bedside and patient kept her eyes open longer while she was speaking to her.   Disposition Plan & Communication   Status is: Inpatient  Remains inpatient appropriate because: Patient is critically ill on BiPAP in Stepdown/ICU.    Due to advanced age, chronic comorbidities and severity of acute illness, patient is at high risk for morbidity and mortality.  Family Communication: Sister, Freda Munro, at bedside on rounds today 10/23   Consults, Procedures, Significant Events   Consultants:  pulmonology  Procedures:  none  Antimicrobials:  Anti-infectives (From admission, onward)    Start     Dose/Rate Route Frequency Ordered Stop   11/27/20 2100  cefTRIAXone (ROCEPHIN) 1 g in sodium chloride 0.9 % 100 mL IVPB        1 g 200  mL/hr over 30 Minutes Intravenous Every 24 hours 11/26/20 0546 12/01/20 2059   11/27/20 1000  remdesivir 100 mg in sodium chloride 0.9 % 100 mL IVPB  Status:  Discontinued       See Hyperspace for full Linked Orders Report.   100 mg 200 mL/hr over 30 Minutes Intravenous Daily 11/26/20 0529 11/26/20 0543   11/27/20 1000  remdesivir 100 mg in sodium chloride 0.9 % 100 mL IVPB       See Hyperspace for full Linked Orders Report.   100 mg 200 mL/hr over 30 Minutes Intravenous Daily 11/30/2020 2322 12/01/20 0959   11/26/20 0530  remdesivir 200 mg in sodium chloride 0.9% 250 mL IVPB  Status:  Discontinued       See Hyperspace for full Linked Orders Report.   200 mg 580 mL/hr over 30 Minutes Intravenous Once 11/26/20 0529 11/26/20 0543   11/26/20 0529  azithromycin (ZITHROMAX) 500 mg in sodium chloride 0.9 % 250 mL IVPB  Status:  Discontinued        500 mg 250 mL/hr over 60 Minutes Intravenous Every  24 hours 11/26/20 0529 11/26/20 0543   11/26/20 0529  cefTRIAXone (ROCEPHIN) 1 g in sodium chloride 0.9 % 100 mL IVPB  Status:  Discontinued        1 g 200 mL/hr over 30 Minutes Intravenous Every 24 hours 11/26/20 0529 11/26/20 0546   11/26/20 0100  remdesivir 200 mg in sodium chloride 0.9% 250 mL IVPB       See Hyperspace for full Linked Orders Report.   200 mg 580 mL/hr over 30 Minutes Intravenous Once 11/08/2020 2322 11/26/20 0329   11/27/2020 2100  cefTRIAXone (ROCEPHIN) 2 g in sodium chloride 0.9 % 100 mL IVPB  Status:  Discontinued        2 g 200 mL/hr over 30 Minutes Intravenous Every 24 hours 11/20/2020 2057 11/26/20 0546   11/24/2020 2100  azithromycin (ZITHROMAX) 500 mg in sodium chloride 0.9 % 250 mL IVPB        500 mg 250 mL/hr over 60 Minutes Intravenous Every 24 hours 11/21/2020 2057 11/30/20 2059         Micro    Objective   Vitals:   11/29/20 0900 11/29/20 1000 11/29/20 1100 11/29/20 1200  BP: (!) 136/53 (!) 157/56 (!) 140/54 (!) 131/53  Pulse: 64 70 65 65  Resp: (!) 21 (!) 24 18 15    Temp:    98 F (36.7 C)  TempSrc:    Axillary  SpO2: 97% 96% 95% 97%  Weight:      Height:        Intake/Output Summary (Last 24 hours) at 11/29/2020 1325 Last data filed at 11/29/2020 1100 Gross per 24 hour  Intake 220 ml  Output 1670 ml  Net -1450 ml   Filed Weights   12/02/2020 1908 11/29/20 0027  Weight: 44.5 kg 45.9 kg    Physical Exam:  General exam: Sleeping comfortably, opens eyes briefly to voice, appears comfortable, no acute distress, frail HEENT: wearing BiPAP mask Respiratory system: course rhonchi, on BiPAP 12/5 40% FiO2 Cardiovascular system: normal S1/S2, RRR, lower extremity edema, JVP noted.   Gastrointestinal system: soft, non-tender abdomen Central nervous system: unable to fully evaluate due to pt's current condition, somnolent, no gross focal neurologic deficits Extremities: diffuse patchy ecchymosis and hyperpigmentation of BLE's, BLE pitting edema  Labs   Data Reviewed: I have personally reviewed following labs and imaging studies  CBC: Recent Labs  Lab 11/19/2020 1911 11/26/20 0658 11/27/20 0408 11/28/20 0520 11/29/20 0544  WBC 16.2* 9.3 11.7* 12.4* 11.6*  NEUTROABS 10.8*  --  8.2* 8.8* 8.6*  HGB 10.7* 9.3* 9.2* 9.4* 9.2*  HCT 35.5* 31.0* 31.0* 31.6* 31.4*  MCV 103.2* 103.7* 104.4* 103.9* 103.3*  PLT 153 136* 123* 126* 829*   Basic Metabolic Panel: Recent Labs  Lab 11/29/2020 1911 11/26/20 0658 11/27/20 0408 11/28/20 0520 11/29/20 0544  NA 141  --  143 143 146*  K 3.4*  --  4.0 4.0 3.6  CL 105  --  107 106 102  CO2 28  --  31 31 36*  GLUCOSE 117*  --  85 102* 113*  BUN 19  --  20 21 20   CREATININE 0.86 0.71 0.76 0.77 0.58  CALCIUM 8.2*  --  8.1* 8.3* 8.1*  MG  --   --   --   --  1.5*   GFR: Estimated Creatinine Clearance: 33.6 mL/min (by C-G formula based on SCr of 0.58 mg/dL). Liver Function Tests: Recent Labs  Lab 12/02/2020 1911 11/27/20 0408 11/28/20 0520 11/29/20 0544  AST  34 28 27 26   ALT 19 18 18 19   ALKPHOS 58 52  51 47  BILITOT 1.5* 0.7 0.6 0.6  PROT 6.8 5.8* 6.0* 5.6*  ALBUMIN 3.9 3.1* 3.3* 3.0*   No results for input(s): LIPASE, AMYLASE in the last 168 hours. No results for input(s): AMMONIA in the last 168 hours. Coagulation Profile: No results for input(s): INR, PROTIME in the last 168 hours. Cardiac Enzymes: No results for input(s): CKTOTAL, CKMB, CKMBINDEX, TROPONINI in the last 168 hours. BNP (last 3 results) No results for input(s): PROBNP in the last 8760 hours. HbA1C: No results for input(s): HGBA1C in the last 72 hours. CBG: Recent Labs  Lab 11/27/20 0652 11/29/20 0009  GLUCAP 124* 129*   Lipid Profile: No results for input(s): CHOL, HDL, LDLCALC, TRIG, CHOLHDL, LDLDIRECT in the last 72 hours. Thyroid Function Tests: No results for input(s): TSH, T4TOTAL, FREET4, T3FREE, THYROIDAB in the last 72 hours. Anemia Panel: Recent Labs    11/28/20 0520 11/29/20 0544  FERRITIN 110 125   Sepsis Labs: Recent Labs  Lab 11/30/2020 2107 11/07/2020 2352  PROCALCITON 0.18  --   LATICACIDVEN  --  0.7    Recent Results (from the past 240 hour(s))  Culture, blood (single)     Status: None (Preliminary result)   Collection Time: 12/02/2020  9:07 PM   Specimen: BLOOD  Result Value Ref Range Status   Specimen Description BLOOD RIGHT ASSIST CONTROL  Final   Special Requests   Final    BOTTLES DRAWN AEROBIC AND ANAEROBIC Blood Culture adequate volume   Culture   Final    NO GROWTH 4 DAYS Performed at [facility], [address] [place], [place] [address]    Report Status PENDING  Incomplete  Resp Panel by RT-PCR (Flu A&B, Covid) Nasopharyngeal Swab     Status: Abnormal   Collection Time: 11/29/2020  9:07 PM   Specimen: Nasopharyngeal Swab; Nasopharyngeal(NP) swabs in vial transport medium  Result Value Ref Range Status   SARS Coronavirus 2 by RT PCR POSITIVE (A) NEGATIVE Final    Comment: RESULT CALLED TO, READ BACK BY AND VERIFIED WITH: MICHELE [email or web address]  11/14/2020  RH (NOTE) SARS-CoV-2 target nucleic acids are DETECTED.  The SARS-CoV-2 RNA is generally detectable in upper respiratory specimens during the acute phase of infection. Positive results are indicative of the presence of the identified virus, but do not rule out bacterial infection or co-infection with other pathogens not detected by the test. Clinical correlation with patient history and other diagnostic information is necessary to determine patient infection status. The expected result is Negative.  Fact Sheet for Patients: EntrepreneurPulse.com.au  Fact Sheet for Healthcare Providers: IncredibleEmployment.be  This test is not yet approved or cleared by the [place] FDA and  has been authorized for detection and/or diagnosis of SARS-CoV-2 by FDA under an Emergency Use Authorization (EUA).  This EUA will remain in effect (meaning this test can be u sed) for the duration of  the COVID-19 declaration under Section 564(b)(1) of the Act, 21 U.S.C. section 360bbb-3(b)(1), unless the authorization is terminated or revoked sooner.     Influenza A by PCR NEGATIVE NEGATIVE Final   Influenza B by PCR NEGATIVE NEGATIVE Final    Comment: (NOTE) The Xpert Xpress SARS-CoV-2/FLU/RSV plus assay is intended as an aid in the diagnosis of influenza from Nasopharyngeal swab specimens and should not be used as a sole basis for treatment. Nasal washings and aspirates are unacceptable for Xpert Xpress SARS-CoV-2/FLU/RSV testing.  Fact  Sheet for Patients: EntrepreneurPulse.com.au  Fact Sheet for Healthcare Providers: IncredibleEmployment.be  This test is not yet approved or cleared by the [place] FDA and has been authorized for detection and/or diagnosis of SARS-CoV-2 by FDA under an Emergency Use Authorization (EUA). This EUA will remain in effect (meaning this test can be used) for the duration of the COVID-19  declaration under Section 564(b)(1) of the Act, 21 U.S.C. section 360bbb-3(b)(1), unless the authorization is terminated or revoked.  Performed at [facility], [address], [place], [place] [address]       Imaging Studies   DG Chest 1 View  Result Date: 11/29/2020 CLINICAL DATA:  Hypoxia. EXAM: CHEST  1 VIEW COMPARISON:  Chest radiograph dated 11/24/2020. CT dated 11/21/2020 FINDINGS: A 3.5 x 2.8 cm ovoid opacity in the left mid lung field corresponds to the mass seen on the prior CT. There is background of emphysema. Diffuse interstitial coarsening and patchy density in the right upper lobe. Small bilateral pleural effusions and bibasilar atelectasis or infiltrate. Stable cardiac enlargement. Atherosclerotic calcification of the aorta. No pneumothorax. No acute osseous pathology. IMPRESSION: 1. A 3.5 x 2.8 cm ovoid opacity in the left mid lung field corresponds to the mass seen on the prior CT. 2. Small bilateral pleural effusions and bibasilar atelectasis or infiltrate. 3. Increased diffuse interstitial prominence and airspace density throughout the right lung. Electronically Signed   By: Anner Crete M.D.   On: 11/29/2020 00:55     Medications   Scheduled Meds:  amLODipine  5 mg Oral Daily   Chlorhexidine Gluconate Cloth  6 each Topical Daily   citalopram  20 mg Oral Daily   dexamethasone (DECADRON) injection  6 mg Intravenous Q24H   enoxaparin (LOVENOX) injection  30 mg Subcutaneous Q24H   Ipratropium-Albuterol  1 puff Inhalation Q6H   lisinopril  30 mg Oral Daily   pantoprazole  40 mg Oral Daily   polyethylene glycol  17 g Oral Daily   senna  1 tablet Oral QHS   Continuous Infusions:  azithromycin 500 mg (11/28/20 2229)   cefTRIAXone (ROCEPHIN)  IV 1 g (11/28/20 2146)   magnesium sulfate bolus IVPB     remdesivir 100 mg in NS 100 mL 200 mL/hr at 11/28/20 1328       LOS: 4 days    Time spent: 30 minutes    Ezekiel Slocumb, DO [facility]  11/29/2020, 1:25 PM      If 7PM-7AM, please contact night-coverage. How to contact the [facility] Attending or Consulting provider [place] or covering provider during after hours [place], for this patient?    Check the care team in [facility] and look for a) attending/consulting [facility] provider listed and b) the [facility] team listed Log into [email or web address] and use [place]'s universal password to access. If you do not have the password, please contact the hospital operator. Locate the [facility] provider you are looking for under [facility] and page to a number that you can be directly reached. If you still have difficulty reaching the provider, please page the [facility] (Director on Call) for the Hospitalists listed on amion for assistance.

## 2020-11-29 NOTE — Progress Notes (Addendum)
Critical care note  Date of note 11/28/2020  Subjective: Pleasant 87 year old Caucasian female with acute hypoxemic respiratory failure due to COVID-19 with developed worsening hypoxemia.  She was on 15L high flow with O2 sats around 74%. Respiratory therapist has put her on non-rebreather with 98% sat.  A stat ABG revealed pH of 7.31 with a PCO2 of 80 and PO2 of 116 with HCO3 of 40.3 and O2 sat of 98.2% on high percent nonrebreather.  She was fairly somnolent and lethargic but arousable.  No reported chest pain or palpitations.  No reported worsening cough or wheezing.  No reported nausea or vomiting.  Objective: Physical examination: BP (!) 165/74 (BP Location: Left Arm)   Pulse 89   Temp 98.3 F (36.8 C) (Oral)   Resp 20   Ht 5' (1.524 m)   Wt 44.5 kg   SpO2 98%   BMI 19.14 kg/m   Generally: Acutely ill elderly Caucasian female sitting in bed in respiratory distress on 100% nonrebreather.  She was fairly somnolent and lethargic but arousable. Head - atraumatic, normocephalic.  Pupils - equal, round and reactive to light and accommodation. Extraocular movements are intact. No scleral icterus.  Oropharynx - moist mucous membranes and tongue. No pharyngeal erythema or exudate.  Neck - supple. No JVD. Carotid pulses 2+ bilaterally. No carotid bruits. No palpable thyromegaly or lymphadenopathy. Cardiovascular - regular rate and rhythm. Normal S1 and S2. No murmurs, gallops or rubs.  Lungs -diffusely coarse breath sounds with diminished bibasal breath sounds and bibasal crackles. Abdomen - soft and nontender. Positive bowel sounds. No palpable organomegaly or masses.  Extremities - no pitting edema, clubbing or cyanosis.  Neuro - grossly non-focal. Skin - no rashes. Breast, pelvic and rectal - deferred.  Labs and notes were reviewed.  Assessment/plan: 1.  Acute hypoxic and hypercarbic respiratory failure due to COVID-19 and possibly acute CHF. - The patient will be placed on BiPAP and  transferred to a stepdown unit bed. - O2 protocol will be followed. - We will follow ABGs and obtain stat chest x-ray. - We will diurese with IV Lasix. - She is at risk for intubation if her ABG does not improve or worsen. - I discussed the case with the ICU team who will be consulted should she deteriorate.  Authorized and performed by: Eugenie Norrie, MD Total critical care time: Approximately  35     minutes. Due to a high probability of clinically significant, life-threatening deterioration, the patient required my highest level of preparedness to intervene emergently and I personally spent this critical care time directly and personally managing the patient.  This critical care time included obtaining a history, examining the patient, pulse oximetry, ordering and review of studies, arranging urgent treatment with development of management plan, evaluation of patient's response to treatment, frequent reassessment, and discussions with other providers. This critical care time was performed to assess and manage the high probability of imminent, life-threatening deterioration that could result in multiorgan failure.  It was exclusive of separately billable procedures and treating other patients and teaching time.

## 2020-11-30 ENCOUNTER — Inpatient Hospital Stay: Payer: Medicare Other

## 2020-11-30 DIAGNOSIS — U071 COVID-19: Secondary | ICD-10-CM

## 2020-11-30 DIAGNOSIS — A4189 Other specified sepsis: Principal | ICD-10-CM

## 2020-11-30 DIAGNOSIS — E43 Unspecified severe protein-calorie malnutrition: Secondary | ICD-10-CM | POA: Diagnosis present

## 2020-11-30 LAB — CBC WITH DIFFERENTIAL/PLATELET
Abs Immature Granulocytes: 2.62 10*3/uL — ABNORMAL HIGH (ref 0.00–0.07)
Basophils Absolute: 0.1 10*3/uL (ref 0.0–0.1)
Basophils Relative: 0 %
Eosinophils Absolute: 0 10*3/uL (ref 0.0–0.5)
Eosinophils Relative: 0 %
HCT: 35.1 % — ABNORMAL LOW (ref 36.0–46.0)
Hemoglobin: 10.1 g/dL — ABNORMAL LOW (ref 12.0–15.0)
Immature Granulocytes: 18 %
Lymphocytes Relative: 5 %
Lymphs Abs: 0.7 10*3/uL (ref 0.7–4.0)
MCH: 29.6 pg (ref 26.0–34.0)
MCHC: 28.8 g/dL — ABNORMAL LOW (ref 30.0–36.0)
MCV: 102.9 fL — ABNORMAL HIGH (ref 80.0–100.0)
Monocytes Absolute: 0.9 10*3/uL (ref 0.1–1.0)
Monocytes Relative: 6 %
Neutro Abs: 10.1 10*3/uL — ABNORMAL HIGH (ref 1.7–7.7)
Neutrophils Relative %: 71 %
Platelets: 164 10*3/uL (ref 150–400)
RBC: 3.41 MIL/uL — ABNORMAL LOW (ref 3.87–5.11)
RDW: 22.2 % — ABNORMAL HIGH (ref 11.5–15.5)
Smear Review: NORMAL
WBC: 14.3 10*3/uL — ABNORMAL HIGH (ref 4.0–10.5)
nRBC: 4.1 % — ABNORMAL HIGH (ref 0.0–0.2)

## 2020-11-30 LAB — COMPREHENSIVE METABOLIC PANEL
ALT: 19 U/L (ref 0–44)
AST: 26 U/L (ref 15–41)
Albumin: 3.3 g/dL — ABNORMAL LOW (ref 3.5–5.0)
Alkaline Phosphatase: 51 U/L (ref 38–126)
Anion gap: 6 (ref 5–15)
BUN: 24 mg/dL — ABNORMAL HIGH (ref 8–23)
CO2: 38 mmol/L — ABNORMAL HIGH (ref 22–32)
Calcium: 8.1 mg/dL — ABNORMAL LOW (ref 8.9–10.3)
Chloride: 99 mmol/L (ref 98–111)
Creatinine, Ser: 0.74 mg/dL (ref 0.44–1.00)
GFR, Estimated: >60 mL/min (ref >60)
Glucose, Bld: 86 mg/dL (ref 70–99)
Potassium: 3.5 mmol/L (ref 3.5–5.1)
Sodium: 143 mmol/L (ref 135–145)
Total Bilirubin: 0.7 mg/dL (ref 0.3–1.2)
Total Protein: 6.1 g/dL — ABNORMAL LOW (ref 6.5–8.1)

## 2020-11-30 LAB — C-REACTIVE PROTEIN: CRP: 13.9 mg/dL — ABNORMAL HIGH (ref <1.0)

## 2020-11-30 LAB — D-DIMER, QUANTITATIVE: D-Dimer, Quant: 1.45 ug/mL-FEU — ABNORMAL HIGH (ref 0.00–0.50)

## 2020-11-30 LAB — CULTURE, BLOOD (SINGLE)
Culture: NO GROWTH
Special Requests: ADEQUATE

## 2020-11-30 LAB — MAGNESIUM: Magnesium: 2.7 mg/dL — ABNORMAL HIGH (ref 1.7–2.4)

## 2020-11-30 LAB — PHOSPHORUS: Phosphorus: 3.6 mg/dL (ref 2.5–4.6)

## 2020-11-30 LAB — FERRITIN: Ferritin: 108 ng/mL (ref 11–307)

## 2020-11-30 LAB — BRAIN NATRIURETIC PEPTIDE: B Natriuretic Peptide: 513 pg/mL — ABNORMAL HIGH (ref 0.0–100.0)

## 2020-11-30 MED ORDER — ENSURE ENLIVE PO LIQD
237.0000 mL | Freq: Three times a day (TID) | ORAL | Status: DC
  Administered 2020-11-30: 237 mL via ORAL

## 2020-11-30 MED ORDER — ADULT MULTIVITAMIN W/MINERALS CH
1.0000 | ORAL_TABLET | Freq: Every day | ORAL | Status: DC
  Filled 2020-11-30: qty 1

## 2020-11-30 MED ORDER — POTASSIUM CHLORIDE CRYS ER 20 MEQ PO TBCR
40.0000 meq | EXTENDED_RELEASE_TABLET | Freq: Once | ORAL | Status: AC
  Administered 2020-11-30: 40 meq via ORAL
  Filled 2020-11-30: qty 2

## 2020-11-30 MED ORDER — DEXMEDETOMIDINE HCL IN NACL 400 MCG/100ML IV SOLN
0.4000 ug/kg/h | INTRAVENOUS | Status: DC
  Administered 2020-11-30: 0.4 ug/kg/h via INTRAVENOUS
  Administered 2020-12-01: 1.2 ug/kg/h via INTRAVENOUS
  Administered 2020-12-01 (×2): 0.8 ug/kg/h via INTRAVENOUS
  Administered 2020-12-02 (×2): 1.2 ug/kg/h via INTRAVENOUS
  Filled 2020-11-30 (×5): qty 100

## 2020-11-30 MED ORDER — AMIODARONE IV BOLUS ONLY 150 MG/100ML
150.0000 mg | Freq: Once | INTRAVENOUS | Status: AC
  Administered 2020-11-30: 150 mg via INTRAVENOUS
  Filled 2020-11-30: qty 100

## 2020-11-30 NOTE — Progress Notes (Signed)
Date: 11/30/2020,   MRN# [id number] [name] Dec 18, 1928     AdmissionWeight: 44.5 kg                 CurrentWeight: 45.9 kg    Referrng physician: Dr Arbutus Ped  CHIEF COMPLAINT:   Mass of left lung   HISTORY OF PRESENT ILLNESS   [name] is a 87 y.o. female with medical history significant of COPD, depression, myelofibrosis, history of subarachnoid hemorrhage, history of tobacco abuse, left eye blindness, hard of hearing, history of anterior cerebral artery aneurysm, essential hypertension among other things who was apparently sent over from [place] [facility] with congestion, of shortness of breath.  She was found to have COVID19 + She was initially assisted with oxygen sats in the 60s.  After warming up in the ER her oxygen sat was 79% on room air.  Patient placed on oxygen.  She is talking in complete sentences but seem to have exertional dyspnea.  Associated with some fever.  Patient also has some basal crackles with some mild pedal edema.  Work-up showed that patient meets sepsis criteria with a white count of 16.2.  Some tachycardia as well as lactic acid elevated.  She also has hypoxia with oxygen sats 79% on room air.   Temperature is 98.6, blood pressure 178/67, pulse 59, respirate of 19 oxygen sats 79% on room air.  White count 16.2 hemoglobin 10.7 platelets 153.  Sodium 141 potassium 3.4 chloride 105 CO2 is 28 BUN 19 creatinine 0.86 and calcium 8.2.  Troponin is flat at 30.  BNP of 639.  Procalcitonin 0.18.  COVID-19 screen is positive but influenza negative.  Urinalysis currently pending.  Chest x-ray showed nodular opacity in the left upper lobe possibly mass versus mild patchy right lower lobe opacity also trace pleural effusion.  CT chest with contrast showed spiculated left upper lobe 3.2 cm lung mass concerning for carcinoma.  Also additional smaller 1 in the right upper lobe about 11 mm.  There is cardiomegaly with small right greater than left pleural effusions.   Groundglass density and patchy consolidation in the right lower lobe and right middle lobe possibly aspiration pneumonia.  Absent emphysema.  PCCM consultation for additional guidance regarding lung mass. I met with brother of patient and we discussed findings together with patient. We discussed that its probably safest to have full scope of therapy for COVID prior to biopsy of any kind.    Interval HPI: Encephalopathic overnight required haldol. AF overnight, given amio bolus, has since converted. Resting after a dose of morphine at time of my evaluation.  PAST MEDICAL HISTORY   Past Medical History:  Diagnosis Date   Aneurysm of anterior cerebral artery    Blindness of left eye    secondary to cataract surgery complication   Depression    Hypertension    Myelofibrosis (HCC)    SAH (subarachnoid hemorrhage) ([place]) 02/2006   Tobacco abuse      SURGICAL HISTORY   Past Surgical History:  Procedure Laterality Date   ABDOMINAL HYSTERECTOMY     ANEURYSM COILING     CATARACT EXTRACTION     CHOLECYSTECTOMY       FAMILY HISTORY   Family History  Problem Relation Age of Onset   Breast cancer Daughter 41   Breast cancer Sister    Throat cancer Mother    Lung cancer Father    Prostate cancer Brother    Leukemia Brother    Prostate cancer Brother  Leukemia Brother      SOCIAL HISTORY   Social History   Tobacco Use   Smoking status: Former    Types: Cigarettes    Quit date: 07/31/2015    Years since quitting: 5.3   Smokeless tobacco: Never  Vaping Use   Vaping Use: Never used  Substance Use Topics   Alcohol use: Yes    Alcohol/week: 1.0 standard drink    Types: 1 Glasses of wine per week    Comment: once a month   Drug use: No     MEDICATIONS    Home Medication:  REM   Current Medication:  Current Facility-Administered Medications:    amLODipine (NORVASC) tablet 5 mg, 5 mg, Oral, Daily, Nicole Kindred A, DO, 5 mg at 11/29/20 1030   cefTRIAXone  (ROCEPHIN) 1 g in sodium chloride 0.9 % 100 mL IVPB, 1 g, Intravenous, Q24H, Belue, Alver Sorrow, RPH, Stopped at 11/29/20 2208   Chlorhexidine Gluconate Cloth 2 % PADS 6 each, 6 each, Topical, Daily, Nicole Kindred A, DO   chlorpheniramine-HYDROcodone (TUSSIONEX) 10-8 MG/5ML suspension 5 mL, 5 mL, Oral, Q12H PRN, Gala Romney L, MD, 5 mL at 11/27/20 1940   citalopram (CELEXA) tablet 20 mg, 20 mg, Oral, Daily, Nicole Kindred A, DO, 20 mg at 11/29/20 1030   dexamethasone (DECADRON) injection 6 mg, 6 mg, Intravenous, Q24H, Garba, Mohammad L, MD, 6 mg at 11/30/20 0541   enoxaparin (LOVENOX) injection 30 mg, 30 mg, Subcutaneous, Q24H, Garba, Mohammad L, MD, 30 mg at 11/29/20 0819   guaiFENesin-dextromethorphan (ROBITUSSIN DM) 100-10 MG/5ML syrup 10 mL, 10 mL, Oral, Q4H PRN, Jonelle Sidle, Mohammad L, MD   haloperidol lactate (HALDOL) injection 2 mg, 2 mg, Intravenous, Q6H PRN, Nicole Kindred A, DO, 2 mg at 11/30/20 0932   Ipratropium-Albuterol (COMBIVENT) respimat 1 puff, 1 puff, Inhalation, Q6H, Garba, Mohammad L, MD, 1 puff at 11/28/20 1420   lisinopril (ZESTRIL) tablet 30 mg, 30 mg, Oral, Daily, Nicole Kindred A, DO, 30 mg at 11/29/20 1030   morphine 2 MG/ML injection 0.5 mg, 0.5 mg, Intravenous, Q6H PRN, Rust-Chester, Britton L, NP, 0.5 mg at 11/30/20 0100   ondansetron (ZOFRAN) tablet 4 mg, 4 mg, Oral, Q6H PRN **OR** ondansetron (ZOFRAN) injection 4 mg, 4 mg, Intravenous, Q6H PRN, Jonelle Sidle, Mohammad L, MD   pantoprazole (PROTONIX) EC tablet 40 mg, 40 mg, Oral, Daily, Nicole Kindred A, DO, 40 mg at 11/29/20 1030   polyethylene glycol (MIRALAX / GLYCOLAX) packet 17 g, 17 g, Oral, Daily, Nicole Kindred A, DO, 17 g at 11/28/20 3557   [COMPLETED] remdesivir 200 mg in sodium chloride 0.9% 250 mL IVPB, 200 mg, Intravenous, Once, Stopped at 11/26/20 0329 **FOLLOWED BY** remdesivir 100 mg in sodium chloride 0.9 % 100 mL IVPB, 100 mg, Intravenous, Daily, Renda Rolls, RPH, Stopped at 11/29/20 1530   senna  (SENOKOT) tablet 8.6 mg, 1 tablet, Oral, QHS, Nicole Kindred A, DO, 8.6 mg at 11/27/20 2325    ALLERGIES   Patient has no known allergies.     REVIEW OF SYSTEMS    Review of Systems:  Unable to obtain in setting of her encephalopathy   VS: BP 108/68 (BP Location: Right Arm)   Pulse 76   Temp 97.9 F (36.6 C) (Oral)   Resp 20   Ht 5' (1.524 m)   Wt 45.9 kg   SpO2 98%   BMI 19.76 kg/m      PHYSICAL EXAM   General appearance: 87 y.o., female, sleeping, chronically ill appearing, very thin  HENT: NCAT;dry MM Neck: Trachea midline; no lymphadenopathy, no JVD Lungs: Crackles on right, diminished on L CV: RRR, no MRGs  Abdomen: Soft, non-tender; non-distended, BS present  Extremities: No peripheral edema, radial and DP pulses present bilaterally  Skin: Normal temperature, turgor and texture; some ecchymosis over BLE  Bedside US with large right, moderate left pleural effusion       IMAGING    DG Chest 1 View  Result Date: 11/29/2020 CLINICAL DATA:  Hypoxia. EXAM: CHEST  1 VIEW COMPARISON:  Chest radiograph dated 11/07/2020. CT dated 11/24/2020 FINDINGS: A 3.5 x 2.8 cm ovoid opacity in the left mid lung field corresponds to the mass seen on the prior CT. There is background of emphysema. Diffuse interstitial coarsening and patchy density in the right upper lobe. Small bilateral pleural effusions and bibasilar atelectasis or infiltrate. Stable cardiac enlargement. Atherosclerotic calcification of the aorta. No pneumothorax. No acute osseous pathology. IMPRESSION: 1. A 3.5 x 2.8 cm ovoid opacity in the left mid lung field corresponds to the mass seen on the prior CT. 2. Small bilateral pleural effusions and bibasilar atelectasis or infiltrate. 3. Increased diffuse interstitial prominence and airspace density throughout the right lung. Electronically Signed   By: Anner Crete M.D.   On: 11/29/2020 00:55   DG Chest 2 View  Result Date: 11/10/2020 CLINICAL DATA:   Shortness of breath EXAM: CHEST - 2 VIEW COMPARISON:  04/28/2020 FINDINGS: Nodular opacity in the left upper lobe, possibly associated with the anterior left 3rd rib. However, if pulmonary, this would be suspicious for an mass. Mild patchy right lower lobe opacity, chronic, likely atelectasis versus scarring. Trace left pleural effusion. The heart is top-normal in size.  Thoracic aortic atherosclerosis. IMPRESSION: Nodular opacity in the left upper lobe, possibly osseous, mass not excluded. Consider CT chest with contrast for further evaluation lobe. Mild patchy right lower lobe opacity, favoring atelectasis versus scarring. Trace left pleural effusion. Electronically Signed   By: Julian Hy M.D.   On: 11/22/2020 19:39   CT Chest W Contrast  Result Date: 11/16/2020 CLINICAL DATA:  Abnormal chest x-ray EXAM: CT CHEST WITH CONTRAST TECHNIQUE: Multidetector CT imaging of the chest was performed during intravenous contrast administration. CONTRAST:  21m OMNIPAQUE IOHEXOL 300 MG/ML  SOLN COMPARISON:  Chest x-ray 12/06/2020, 07/11/2016, CT abdomen 12/04/2018 FINDINGS: Cardiovascular: Advanced aortic atherosclerosis. No aneurysmal dilatation. No dissection seen. Cardiomegaly. Coronary vascular calcification. No pericardial effusion Mediastinum/Nodes: Midline trachea. No thyroid mass. Nonspecific mediastinal lymph nodes measuring up to 8 mm in the precarinal space. Esophagus within normal limits. Lungs/Pleura: Emphysema. Small right greater than left pleural effusions. 3.2 by 2.7 by 2.2 cm spiculated left upper lobe lung mass. Smaller slightly spiculated right upper lobe lung nodule measuring 11 by 10 by 10 mm, series 3, image 63. Bilateral lower lobe bronchial wall thickening with mild mucous plugging in the right lower lobe. Patchy consolidation in the right lower lobe and right middle lobe. No visible pneumothorax. Upper Abdomen: Incompletely visualized cyst exophytic to the upper pole right kidney. Adrenal  gland adenomas. Incompletely visualized spleen appears enlarged. Musculoskeletal: No acute osseous abnormality. Chronic compression fracture at L1 IMPRESSION: 1. Spiculated left upper lobe 3.2 cm lung mass concerning for carcinoma. There is an additional smaller spiculated suspicious nodule in the right upper lobe measuring 11 mm. 2. Cardiomegaly with small right greater than left pleural effusions. Lower lobe bronchial wall thickening with mild mucous plugging in right lower lobe. Ground-glass density and patchy consolidation in the right lower lobe and  right middle lobe potentially due to aspiration or pneumonia. 3. Emphysema 4. Incompletely visualized splenomegaly.  Adrenal gland adenomas. Aortic Atherosclerosis (ICD10-I70.0) and Emphysema (ICD10-J43.9). Electronically Signed   By: Donavan Foil M.D.   On: 12/03/2020 22:09   ECHOCARDIOGRAM COMPLETE  Result Date: 11/26/2020    ECHOCARDIOGRAM REPORT   Patient Name:   TENNILLE MONTELONGO Date of Exam: 11/26/2020 Medical Rec #:  [id number]          Height:       60.0 in Accession #:    [id number]         Weight:       98.0 lb Date of Birth:  04-05-1928          BSA:          1.378 m Patient Age:    62 years           BP:           139/57 mmHg Patient Gender: F                  HR:           83 bpm. Exam Location:  [facility] Procedure: 2D Echo, Cardiac Doppler and Color Doppler Indications:     CHF-acute diastolic A19.37  History:         Patient has prior history of Echocardiogram examinations, most                  recent 04/29/2020. Risk Factors:Hypertension. Tobacco abuse.  Sonographer:     Sherrie Sport Referring Phys:  [place] Diagnosing Phys: Ida Rogue MD IMPRESSIONS  1. Left ventricular ejection fraction, by estimation, is 55 to 60%. The left ventricle has normal function. The left ventricle has no regional wall motion abnormalities. Left ventricular diastolic parameters are indeterminate.  2. Right ventricular systolic function is low normal. The  right ventricular size is severely enlarged. There is moderately elevated pulmonary artery systolic pressure. The estimated right ventricular systolic pressure is 90.2 mmHg.  3. Left atrial size was mildly dilated.  4. Right atrial size was moderately dilated.  5. The mitral valve is normal in structure. No evidence of mitral valve regurgitation. No evidence of mitral stenosis.  6. Tricuspid valve regurgitation is moderate.  7. The aortic valve was not well visualized. Aortic valve regurgitation is mild to moderate. Mild to moderate aortic valve sclerosis/calcification is present, without any evidence of aortic stenosis.  8. The inferior vena cava is normal in size with greater than 50% respiratory variability, suggesting right atrial pressure of 3 mmHg.  9. Large left pleural effusion noted FINDINGS  Left Ventricle: Left ventricular ejection fraction, by estimation, is 55 to 60%. The left ventricle has normal function. The left ventricle has no regional wall motion abnormalities. The left ventricular internal cavity size was normal in size. There is  no left ventricular hypertrophy. Left ventricular diastolic parameters are indeterminate. Right Ventricle: The right ventricular size is severely enlarged. No increase in right ventricular wall thickness. Right ventricular systolic function is low normal. There is moderately elevated pulmonary artery systolic pressure. The tricuspid regurgitant velocity is 3.60 m/s, and with an assumed right atrial pressure of 5 mmHg, the estimated right ventricular systolic pressure is 40.9 mmHg. Left Atrium: Left atrial size was mildly dilated. Right Atrium: Right atrial size was moderately dilated. Pericardium: There is no evidence of pericardial effusion. Mitral Valve: The mitral valve is normal in structure. There is mild thickening of the  mitral valve leaflet(s). Mild mitral annular calcification. No evidence of mitral valve regurgitation. No evidence of mitral valve stenosis.  Tricuspid Valve: The tricuspid valve is normal in structure. Tricuspid valve regurgitation is moderate . No evidence of tricuspid stenosis. Aortic Valve: The aortic valve was not well visualized. Aortic valve regurgitation is mild to moderate. Aortic regurgitation PHT measures 414 msec. Mild to moderate aortic valve sclerosis/calcification is present, without any evidence of aortic stenosis.  Aortic valve mean gradient measures 3.0 mmHg. Aortic valve peak gradient measures 6.7 mmHg. Aortic valve area, by VTI measures 4.11 cm. Pulmonic Valve: The pulmonic valve was normal in structure. Pulmonic valve regurgitation is not visualized. No evidence of pulmonic stenosis. Aorta: The aortic root is normal in size and structure. Venous: The inferior vena cava is normal in size with greater than 50% respiratory variability, suggesting right atrial pressure of 3 mmHg. IAS/Shunts: No atrial level shunt detected by color flow Doppler.  LEFT VENTRICLE PLAX 2D LVIDd:         5.10 cm   Diastology LVIDs:         2.90 cm   LV e' medial:  6.85 cm/s LV PW:         1.20 cm   LV e' lateral: 11.00 cm/s LV IVS:        0.90 cm LVOT diam:     2.00 cm LV SV:         90 LV SV Index:   65 LVOT Area:     3.14 cm  RIGHT VENTRICLE RV S prime:     11.90 cm/s TAPSE (M-mode): 4.6 cm LEFT ATRIUM              Index        RIGHT ATRIUM           Index LA diam:        3.90 cm  2.83 cm/m   RA Area:     26.60 cm LA Vol (A2C):   102.0 ml 74.00 ml/m  RA Volume:   97.30 ml  70.59 ml/m LA Vol (A4C):   67.4 ml  48.90 ml/m LA Biplane Vol: 82.6 ml  59.92 ml/m  AORTIC VALVE                     PULMONIC VALVE AV Area (Vmax):    3.92 cm      PV Vmax:        0.63 m/s AV Area (Vmean):   4.63 cm      PV Peak grad:   1.6 mmHg AV Area (VTI):     4.11 cm      RVOT Peak grad: 3 mmHg AV Vmax:           129.00 cm/s AV Vmean:          75.300 cm/s AV VTI:            0.218 m AV Peak Grad:      6.7 mmHg AV Mean Grad:      3.0 mmHg LVOT Vmax:         161.00 cm/s LVOT  Vmean:        111.000 cm/s LVOT VTI:          0.285 m LVOT/AV VTI ratio: 1.31 AI PHT:            414 msec  AORTA Ao Root diam: 3.30 cm TRICUSPID VALVE TR Peak grad:   51.8 mmHg TR Vmax:  360.00 cm/s  SHUNTS Systemic VTI:  0.29 m Systemic Diam: 2.00 cm Ida Rogue MD Electronically signed by Ida Rogue MD Signature Date/Time: 11/26/2020/8:49:32 PM    Final       ASSESSMENT/PLAN   Left lung mass RUL lung nodule  3.2 cm spiculated left upper lobe lung mass. Borderline adenopathy, bilateral R>L effusions. -if sufficient pocket could entertain diagnostic thoracentesis but overall further evaluation TBD pending clinical trajectory and ongoing goals of care discussion  Acute Hypoxic respiratory failure Acute COVID19 pneumonia Acute on chronic diastolic heart failure Pulmonary Hypertension -remdesivir for 5d course -decadron 6 IV daily -flutter valve for pulmonary hygiene -PT/OT, mobilization  New onset atrial fibrillation - CTM - AC per primary but given onset in setting acute illness and history of SAH would probably hold off  Bilateral pleural effusions Could be transudate in setting diastolic dysfunction vs parapneumonic vs malignant. Does not appear to have been present on CXR from 04/2020. - would continue to diurese for net negative fluid balance - possible thoracentesis today depending on [place], MD [place]

## 2020-11-30 NOTE — Progress Notes (Addendum)
Initial Nutrition Assessment  DOCUMENTATION CODES:   Severe malnutrition in context of chronic illness  INTERVENTION:   Ensure Enlive po TID, each supplement provides 350 kcal and 20 grams of protein  Magic cup TID with meals, each supplement provides 290 kcal and 9 grams of protein  MVI po daily   Pt at high refeed risk; recommend monitor potassium, magnesium and phosphorus labs daily until stable  NUTRITION DIAGNOSIS:   Severe Malnutrition related to chronic illness (COPD, dementia) as evidenced by severe fat depletion, severe muscle depletion.  GOAL:   Patient will meet greater than or equal to 90% of their needs  MONITOR:   PO intake, Supplement acceptance, Labs, Weight trends, Skin, I & O's  REASON FOR ASSESSMENT:   Diagnosis    ASSESSMENT:   87 y.o. female with medical history significant of COPD, depression, myelofibrosis, subarachnoid hemorrhage, tobacco abuse, left eye blindness, hard of hearing, anterior cerebral artery aneurysm, essential hypertension and vascular dementia who is admitted with sepsis, COVID 19, new Afib and new lung mass.  Pt unable to provide any nutrition related history today. Per chart, pt with poor appetite and oral intake since admission. RD suspects pt with poor oral intake at baseline at pt is malnourished. RD will add supplements and MVI to help pt meet her estimated needs. Pt is at high refeed risk. Per chart, pt appears fairly weight stable at pta Palliative care consult pending; depending on pt's [place], may need to consider nasogastric tube and feeds to help pt meet her estimated needs.   Medications reviewed and include: celexa, lovenox, dexamethasone, protonix, miralax, Kcl, senokot, ceftriaxone   Labs reviewed: K 3.5 wnl, P 3.6 wnl, Mg 2.7(H) Wbc- 14.3(H), Hgb 10.1(L), Hct 35.1(L)  NUTRITION - FOCUSED PHYSICAL EXAM:  Flowsheet Row Most Recent Value  Orbital Region Severe depletion  Upper Arm Region Severe depletion  Thoracic  and Lumbar Region Severe depletion  Buccal Region Severe depletion  Temple Region Severe depletion  Clavicle Bone Region Severe depletion  Clavicle and Acromion Bone Region Severe depletion  Scapular Bone Region Severe depletion  Dorsal Hand Severe depletion  Patellar Region Severe depletion  Anterior Thigh Region Severe depletion  Posterior Calf Region Severe depletion  Edema (RD Assessment) Mild  Hair Reviewed  Eyes Reviewed  Mouth Reviewed  Skin Reviewed  Nails Reviewed   Diet Order:   Diet Order             DIET DYS 3 Room service appropriate? Yes with Assist; Fluid consistency: Thin  Diet effective now                  EDUCATION NEEDS:   No education needs have been identified at this time  Skin:  Skin Assessment: Reviewed RN Assessment (ecchymosis)  Last BM:  10/19  Height:   Ht Readings from Last 1 Encounters:  11/26/2020 5' (1.524 m)    Weight:   Wt Readings from Last 1 Encounters:  11/29/20 45.9 kg    Ideal Body Weight:  45.45 kg  BMI:  Body mass index is 19.76 kg/m.  Estimated Nutritional Needs:   Kcal:  1300-1500kcal/day  Protein:  65-75g/day  Fluid:  1.2-1.4L/day  Koleen Distance MS, RD, LDN Please refer to [facility] for RD and/or RD on-call/weekend/after hours pager

## 2020-11-30 NOTE — Progress Notes (Signed)
PT Cancellation Note  Patient Details Name: [name] MRN: [id number] DOB: Oct 07, 1928   Cancelled Treatment:    Reason Eval/Treat Not Completed: Medical issues which prohibited therapy (Per chart review, noted with transfer to CCU due to decline in medical status.  Per guidelines, to require new order to resume PT services after transfer to higher level of care.  Please re-consult as medically appropriate and as goals of care dictate.)  Deacon Gadbois H. Owens Shark, PT, DPT, NCS 11/30/20, 10:41 PM [phone]

## 2020-11-30 NOTE — Progress Notes (Signed)
Brief PCCM Note  Called and spoke with the patient's daughter [name]. She believes the patient has previously expressed that she wished to be DNR. She agrees that a trial of time on a ventilator or ACLS measures if she developed respiratory failure or cardiac arrest would be unlikely to provide an opportunity for her to have meaningful neurologic recovery and certainly not without a prolonged and rocky course. She was made DNR.  We also talked about her lung mass, lymphadenopathy and pleural effusions. Right lung nodule, pleural effusions may or may not reflect metastatic disease. Regardless to me it looks like she's got a long way to go in order to get well enough for treatment even if candidate for lighter regimen or immunotherapy/targeted gene therapy. She wishes to speak more with palliative care and her family about options for her care going forward - whether continuing supportive measures with hope for recovery or instead prioritizing comfort and time at home with hospice. She does think that performing thoracentesis would be reasonable intervention, particularly if we thought it could potentially improve her dyspnea although as above, it is unclear to me that she'll recover from this acute illness to extent she'd be candidate for treatment and would risk cancer progression while facing likely lengthy recovery.   [place]

## 2020-11-30 NOTE — Progress Notes (Signed)
Pt restless, crying out, attempting to get out of bed, pulling off oxygen and lines.  Dr. Verlee Monte, [place], notified and Precedex ordered.  Hospitalist on call notified as well.

## 2020-11-30 NOTE — Progress Notes (Addendum)
PROGRESS NOTE    [name]   UEA:[id number]  DOB: February 28, 1928  PCP: Tracie Harrier, MD    DOA: 11/23/2020 LOS: 5    Brief Narrative / Hospital Course to Date:   Per H&P "87 y.o. female with medical history significant of COPD, depression, myelofibrosis, history of subarachnoid hemorrhage, history of tobacco abuse, left eye blindness, hard of hearing, history of anterior cerebral artery aneurysm, essential hypertension among other things who was apparently sent over from [place] [facility] with congestion, of shortness of breath.  She was initially assisted with oxygen sats in the 60s.  After warming up in the ER her oxygen sat was 79% on room air.  Patient placed on oxygen.  She is talking in complete sentences but seem to have exertional dyspnea.  Associated with some fever.  Patient also has some basal crackles with some mild pedal edema.  Work-up showed that patient meets sepsis criteria with a white count of 16.2.  Some tachycardia as well as lactic acid elevated.  She also has hypoxia with oxygen sats 79% on room air.  She has elevated BNP which is new with findings suggestive of congestive heart failure but more importantly patient was found to have COVID-19 positive results with new lung lesions concerning for new malignancy.  She is therefore being admitted to the hospital for further evaluation and treatment.    Patient was found to be positive for Covid-19.  Chest xray and CT chest with contrast showed RML and RLL consolidations consistent with ?aspiration pneumonia and a LUL mass.  Admitted and started on remdesevir, steroids, antibiotics.  Pulmonology consulted.  Assessment & Plan   Principal Problem:   Sepsis due to COVID-19 [facility]) Active Problems:   Acute on chronic respiratory failure with hypoxia (HCC)   COPD (chronic obstructive pulmonary disease) (HCC)   Myelofibrosis (HCC)   HTN (hypertension)   Goals of care, counseling/discussion   Depression   Vascular  dementia without behavioral disturbance ([place])   Pneumonia due to COVID-19 virus   Acute diastolic CHF (congestive heart failure) (HCC)   Hypokalemia   Lung mass   Protein-calorie malnutrition, severe   Sepsis due to Covid-19 vs aspiration pneumonia with  Acute respiratory failure with hypoxia and hypercapnea -  Continue remdesivir, steroids and antibiotics Supplement O2, maintain sat > 90%, wean as tolerated. (Pt does not require O2 at baseline). BiPAP as needed Monitor inflammatory markers, CMP, CBC Pulmonary hygiene with I-S and flutter SLP evaluation - see their recommendations PCCM now following  CODE STATUS Discussed 10/21 -  Pt confirmed full code for now.  Wants to discuss with her children.  Encouraged ongoing discussions about whether she would want extraordinary measures or to be placed on ventilator.  Sister at bedside agrees patient should discuss with her children.   --Palliative care consult pending. --Continue [place] discussions  New onset A-fib - given amiodarone bolus overnight 10/23-24.  Likely triggered by above acute illness.   Has hx of SAH, will defer anticoagulation at this time given high risk.  Bilateral pleural effusions - new/progressed since initial CXR.  Treated with Lasix overnight.  Consider thoracentesis.  Further diuresis as needed.  Hypernatremia - Resolved.  Due to poor PO intake secondary to confusion and acute illness. Na 146 on 10/23.  Improved without use of D5w. --Encourage PO intake --Monitor BMP  Hypomagnesemia - replacing 4g IV Mg-sulfate for Mg 1.5 (10/23).  Monitor and replace as needed, goal Mg>2.   New LUL lung mass - pulmonology consulted.  Dr. Tasia Catchings, patient's oncologist with whom she follows for myelofibrosis therapy has been updated on this finding. --Evaluation on hold until recovered from acute illness  Myelofibrosis - Dr. Tasia Catchings aware of admission, advised to hold patient's Jakafi.  Thrombocytopenia -likely due to acute infection.   Monitor CBC.  Essential hypertension continue home lisinopril and amlodipine.   Hypokalemia: Replaced potassium. Monitor and replace as needed   COPD with mild exacerbation: Secondary to acute infection.  Continue with steroids and breathing treatments   Vascular dementia: At baseline.  No acute issues or behavioral disturbance noted. Delirium precautions.   ?New onset CHF: Suspected diastolic dysfunction.   Echo pending - follow up Monitor volume status   Depression with anxiety: continue Celexa  Normocytic anemia: due to chronic disease.  Hbg stable. Follow CBC's  GERD - continue PPI    Patient BMI: Body mass index is 19.76 kg/m.   DVT prophylaxis: enoxaparin (LOVENOX) injection 30 mg Start: 11/26/20 0800   Diet:  Diet Orders (From admission, onward)     Start     Ordered   11/28/20 1308  DIET DYS 3 Room service appropriate? Yes with Assist; Fluid consistency: Thin  Diet effective now       Comments: NO STRAWS!!!  Extra Gravy on chopped meats. CREAM SOUPS.  Question Answer Comment  Room service appropriate? Yes with Assist   Fluid consistency: Thin      11/28/20 1308              Code Status: Full Code   Subjective 11/30/20    Pt seen in ICU this Am.  She was sleeping, but woke easily to voice.  She was able to tell me her siblings' and daughter's names, oriented.  Says she feels okay.  Denies SOB.  Says cough feels better.  No chest pain or other complaints.   Per nursing, pt given haldol overnight for some agitation.  Also given amiodarone for A-fib overnight.  Disposition Plan & Communication   Status is: Inpatient  Remains inpatient appropriate because: remains on significant supplemental oxygen and IV therapies required.  Due to advanced age, chronic comorbidities and severity of acute illness, patient is at high risk for morbidity and mortality.  Family Communication: none at bedside during encounter.     Consults, Procedures, Significant  Events   Consultants:  pulmonology  Procedures:  none  Antimicrobials:  Anti-infectives (From admission, onward)    Start     Dose/Rate Route Frequency Ordered Stop   11/27/20 2100  cefTRIAXone (ROCEPHIN) 1 g in sodium chloride 0.9 % 100 mL IVPB        1 g 200 mL/hr over 30 Minutes Intravenous Every 24 hours 11/26/20 0546 12/01/20 2059   11/27/20 1000  remdesivir 100 mg in sodium chloride 0.9 % 100 mL IVPB  Status:  Discontinued       See Hyperspace for full Linked Orders Report.   100 mg 200 mL/hr over 30 Minutes Intravenous Daily 11/26/20 0529 11/26/20 0543   11/27/20 1000  remdesivir 100 mg in sodium chloride 0.9 % 100 mL IVPB       See Hyperspace for full Linked Orders Report.   100 mg 200 mL/hr over 30 Minutes Intravenous Daily 12/05/2020 2322 11/30/20 1107   11/26/20 0530  remdesivir 200 mg in sodium chloride 0.9% 250 mL IVPB  Status:  Discontinued       See Hyperspace for full Linked Orders Report.   200 mg 580 mL/hr over 30 Minutes Intravenous Once  11/26/20 0529 11/26/20 0543   11/26/20 0529  azithromycin (ZITHROMAX) 500 mg in sodium chloride 0.9 % 250 mL IVPB  Status:  Discontinued        500 mg 250 mL/hr over 60 Minutes Intravenous Every 24 hours 11/26/20 0529 11/26/20 0543   11/26/20 0529  cefTRIAXone (ROCEPHIN) 1 g in sodium chloride 0.9 % 100 mL IVPB  Status:  Discontinued        1 g 200 mL/hr over 30 Minutes Intravenous Every 24 hours 11/26/20 0529 11/26/20 0546   11/26/20 0100  remdesivir 200 mg in sodium chloride 0.9% 250 mL IVPB       See Hyperspace for full Linked Orders Report.   200 mg 580 mL/hr over 30 Minutes Intravenous Once 11/21/2020 2322 11/26/20 0329   11/17/2020 2100  cefTRIAXone (ROCEPHIN) 2 g in sodium chloride 0.9 % 100 mL IVPB  Status:  Discontinued        2 g 200 mL/hr over 30 Minutes Intravenous Every 24 hours 11/20/2020 2057 11/26/20 0546   11/21/2020 2100  azithromycin (ZITHROMAX) 500 mg in sodium chloride 0.9 % 250 mL IVPB        500 mg 250 mL/hr  over 60 Minutes Intravenous Every 24 hours 11/07/2020 2057 11/29/20 2103         Micro    Objective   Vitals:   11/30/20 1500 11/30/20 1600 11/30/20 1640 11/30/20 1700  BP: (!) 131/54 (!) 141/73  139/69  Pulse: 68 76  76  Resp: 18 (!) 23  (!) 23  Temp:      TempSrc:      SpO2: 98% 96% 97% 92%  Weight:      Height:        Intake/Output Summary (Last 24 hours) at 11/30/2020 1726 Last data filed at 11/30/2020 1700 Gross per 24 hour  Intake 515.47 ml  Output 400 ml  Net 115.47 ml   Filed Weights   12/02/2020 1908 11/29/20 0027  Weight: 44.5 kg 45.9 kg    Physical Exam:  General exam: sleeping but woke easily to voice and remained engaged in conversation, appears comfortable, no acute distress, frail HEENT: moist mucus membranes clear conjunctiva Respiratory system: rhonchi improved today, on 10 L/min HFNC O2 Cardiovascular system: normal S1/S2, RRR, lower extremity edema improved   Gastrointestinal system: soft, non-tender abdomen Central nervous system: normal speech, no gross focal neurologic deficits Extremities: diffuse patchy ecchymosis and hyperpigmentation of BLE's, improved BLE pitting edema  Labs   Data Reviewed: I have personally reviewed following labs and imaging studies  CBC: Recent Labs  Lab 11/18/2020 1911 11/26/20 0658 11/27/20 0408 11/28/20 0520 11/29/20 0544 11/30/20 0515  WBC 16.2* 9.3 11.7* 12.4* 11.6* 14.3*  NEUTROABS 10.8*  --  8.2* 8.8* 8.6* 10.1*  HGB 10.7* 9.3* 9.2* 9.4* 9.2* 10.1*  HCT 35.5* 31.0* 31.0* 31.6* 31.4* 35.1*  MCV 103.2* 103.7* 104.4* 103.9* 103.3* 102.9*  PLT 153 136* 123* 126* 125* 417   Basic Metabolic Panel: Recent Labs  Lab 11/18/2020 1911 11/26/20 0658 11/27/20 0408 11/28/20 0520 11/29/20 0544 11/30/20 0515  NA 141  --  143 143 146* 143  K 3.4*  --  4.0 4.0 3.6 3.5  CL 105  --  107 106 102 99  CO2 28  --  31 31 36* 38*  GLUCOSE 117*  --  85 102* 113* 86  BUN 19  --  20 21 20  24*  CREATININE 0.86 0.71 0.76  0.77 0.58 0.74  CALCIUM 8.2*  --  8.1* 8.3* 8.1* 8.1*  MG  --   --   --   --  1.5* 2.7*  PHOS  --   --   --   --   --  3.6   GFR: Estimated Creatinine Clearance: 33.6 mL/min (by C-G formula based on SCr of 0.74 mg/dL). Liver Function Tests: Recent Labs  Lab 12/05/2020 1911 11/27/20 0408 11/28/20 0520 11/29/20 0544 11/30/20 0515  AST 34 28 27 26 26   ALT 19 18 18 19 19   ALKPHOS 58 52 51 47 51  BILITOT 1.5* 0.7 0.6 0.6 0.7  PROT 6.8 5.8* 6.0* 5.6* 6.1*  ALBUMIN 3.9 3.1* 3.3* 3.0* 3.3*   No results for input(s): LIPASE, AMYLASE in the last 168 hours. No results for input(s): AMMONIA in the last 168 hours. Coagulation Profile: No results for input(s): INR, PROTIME in the last 168 hours. Cardiac Enzymes: No results for input(s): CKTOTAL, CKMB, CKMBINDEX, TROPONINI in the last 168 hours. BNP (last 3 results) No results for input(s): PROBNP in the last 8760 hours. HbA1C: No results for input(s): HGBA1C in the last 72 hours. CBG: Recent Labs  Lab 11/27/20 0652 11/29/20 0009  GLUCAP 124* 129*   Lipid Profile: No results for input(s): CHOL, HDL, LDLCALC, TRIG, CHOLHDL, LDLDIRECT in the last 72 hours. Thyroid Function Tests: No results for input(s): TSH, T4TOTAL, FREET4, T3FREE, THYROIDAB in the last 72 hours. Anemia Panel: Recent Labs    11/29/20 0544 11/30/20 0515  FERRITIN 125 108   Sepsis Labs: Recent Labs  Lab 11/07/2020 2107 11/09/2020 2352  PROCALCITON 0.18  --   LATICACIDVEN  --  0.7    Recent Results (from the past 240 hour(s))  Culture, blood (single)     Status: None   Collection Time: 11/16/2020  9:07 PM   Specimen: BLOOD  Result Value Ref Range Status   Specimen Description BLOOD RIGHT ASSIST CONTROL  Final   Special Requests   Final    BOTTLES DRAWN AEROBIC AND ANAEROBIC Blood Culture adequate volume   Culture   Final    NO GROWTH 5 DAYS Performed at [facility], [address] [place], [place] [address]    Report Status 11/30/2020 FINAL   Final  Resp Panel by RT-PCR (Flu A&B, Covid) Nasopharyngeal Swab     Status: Abnormal   Collection Time: 11/16/2020  9:07 PM   Specimen: Nasopharyngeal Swab; Nasopharyngeal(NP) swabs in vial transport medium  Result Value Ref Range Status   SARS Coronavirus 2 by RT PCR POSITIVE (A) NEGATIVE Final    Comment: RESULT CALLED TO, READ BACK BY AND VERIFIED WITH: MICHELE [id number]  12/03/2020 RH (NOTE) SARS-CoV-2 target nucleic acids are DETECTED.  The SARS-CoV-2 RNA is generally detectable in upper respiratory specimens during the acute phase of infection. Positive results are indicative of the presence of the identified virus, but do not rule out bacterial infection or co-infection with other pathogens not detected by the test. Clinical correlation with patient history and other diagnostic information is necessary to determine patient infection status. The expected result is Negative.  Fact Sheet for Patients: EntrepreneurPulse.com.au  Fact Sheet for Healthcare Providers: IncredibleEmployment.be  This test is not yet approved or cleared by the [place] FDA and  has been authorized for detection and/or diagnosis of SARS-CoV-2 by FDA under an Emergency Use Authorization (EUA).  This EUA will remain in effect (meaning this test can be u sed) for the duration of  the COVID-19 declaration under Section 564(b)(1) of the Act, 21 U.S.C. section  360bbb-3(b)(1), unless the authorization is terminated or revoked sooner.     Influenza A by PCR NEGATIVE NEGATIVE Final   Influenza B by PCR NEGATIVE NEGATIVE Final    Comment: (NOTE) The Xpert Xpress SARS-CoV-2/FLU/RSV plus assay is intended as an aid in the diagnosis of influenza from Nasopharyngeal swab specimens and should not be used as a sole basis for treatment. Nasal washings and aspirates are unacceptable for Xpert Xpress SARS-CoV-2/FLU/RSV testing.  Fact Sheet for  Patients: EntrepreneurPulse.com.au  Fact Sheet for Healthcare Providers: IncredibleEmployment.be  This test is not yet approved or cleared by the [place] FDA and has been authorized for detection and/or diagnosis of SARS-CoV-2 by FDA under an Emergency Use Authorization (EUA). This EUA will remain in effect (meaning this test can be used) for the duration of the COVID-19 declaration under Section 564(b)(1) of the Act, 21 U.S.C. section 360bbb-3(b)(1), unless the authorization is terminated or revoked.  Performed at [facility], [address], [place], [place] [address]       Imaging Studies   DG Chest 1 View  Result Date: 11/29/2020 CLINICAL DATA:  Hypoxia. EXAM: CHEST  1 VIEW COMPARISON:  Chest radiograph dated 11/26/2020. CT dated 11/28/2020 FINDINGS: A 3.5 x 2.8 cm ovoid opacity in the left mid lung field corresponds to the mass seen on the prior CT. There is background of emphysema. Diffuse interstitial coarsening and patchy density in the right upper lobe. Small bilateral pleural effusions and bibasilar atelectasis or infiltrate. Stable cardiac enlargement. Atherosclerotic calcification of the aorta. No pneumothorax. No acute osseous pathology. IMPRESSION: 1. A 3.5 x 2.8 cm ovoid opacity in the left mid lung field corresponds to the mass seen on the prior CT. 2. Small bilateral pleural effusions and bibasilar atelectasis or infiltrate. 3. Increased diffuse interstitial prominence and airspace density throughout the right lung. Electronically Signed   By: Anner Crete M.D.   On: 11/29/2020 00:55   DG Chest Port 1 View  Result Date: 11/30/2020 CLINICAL DATA:  Left lung mass EXAM: PORTABLE CHEST 1 VIEW COMPARISON:  Prior chest x-ray 11/29/2020 FINDINGS: Stable masslike opacity in the left mid lung. Persistent pulmonary vascular congestion with at least mild pulmonary edema. Increasing patchy airspace opacities throughout the  right perihilar region and right upper lobe. Enlarging layering right pleural effusion and associated right basilar atelectasis. Probable small to moderate left pleural effusion as well. No pneumothorax. Stable cardiomegaly. Atherosclerotic calcifications present in the transverse aorta. IMPRESSION: 1. Worsening multifocal patchy airspace opacities throughout the right lung concerning for multifocal pneumonia. 2. Cardiomegaly, pulmonary vascular congestion and at least mild pulmonary edema suggests an element of CHF. 3. Enlarging right layering pleural effusion and associated right basilar atelectasis. 4. Suspect small left pleural effusion as well. 5. Redemonstration of left lung mass. Electronically Signed   By: Jacqulynn Cadet M.D.   On: 11/30/2020 09:03     Medications   Scheduled Meds:  amLODipine  5 mg Oral Daily   Chlorhexidine Gluconate Cloth  6 each Topical Daily   citalopram  20 mg Oral Daily   dexamethasone (DECADRON) injection  6 mg Intravenous Q24H   enoxaparin (LOVENOX) injection  30 mg Subcutaneous Q24H   feeding supplement  237 mL Oral TID BM   Ipratropium-Albuterol  1 puff Inhalation Q6H   lisinopril  30 mg Oral Daily   [START ON 12/01/2020] multivitamin with minerals  1 tablet Oral Daily   pantoprazole  40 mg Oral Daily   polyethylene glycol  17 g Oral Daily  senna  1 tablet Oral QHS   Continuous Infusions:  cefTRIAXone (ROCEPHIN)  IV Stopped (11/29/20 2208)       LOS: 5 days    Time spent: 30 minutes    Ezekiel Slocumb, DO [facility]  11/30/2020, 5:26 PM      If 7PM-7AM, please contact night-coverage. How to contact the [facility] Attending or Consulting provider [place] or covering provider during after hours [place], for this patient?    Check the care team in [facility] and look for a) attending/consulting [facility] provider listed and b) the [facility] team listed Log into [email or web address] and use [place]'s universal password to access. If you do not have the  password, please contact the hospital operator. Locate the [facility] provider you are looking for under [facility] and page to a number that you can be directly reached. If you still have difficulty reaching the provider, please page the [facility] (Director on Call) for the Hospitalists listed on amion for assistance.

## 2020-12-01 DIAGNOSIS — J9621 Acute and chronic respiratory failure with hypoxia: Secondary | ICD-10-CM

## 2020-12-01 DIAGNOSIS — U071 COVID-19: Secondary | ICD-10-CM | POA: Diagnosis not present

## 2020-12-01 DIAGNOSIS — I5031 Acute diastolic (congestive) heart failure: Secondary | ICD-10-CM | POA: Diagnosis not present

## 2020-12-01 DIAGNOSIS — R918 Other nonspecific abnormal finding of lung field: Secondary | ICD-10-CM

## 2020-12-01 DIAGNOSIS — A4189 Other specified sepsis: Secondary | ICD-10-CM | POA: Diagnosis not present

## 2020-12-01 LAB — CBC WITH DIFFERENTIAL/PLATELET
Abs Immature Granulocytes: 1.94 10*3/uL — ABNORMAL HIGH (ref 0.00–0.07)
Basophils Absolute: 0 10*3/uL (ref 0.0–0.1)
Basophils Relative: 0 %
Eosinophils Absolute: 0 10*3/uL (ref 0.0–0.5)
Eosinophils Relative: 0 %
HCT: 30.2 % — ABNORMAL LOW (ref 36.0–46.0)
Hemoglobin: 8.7 g/dL — ABNORMAL LOW (ref 12.0–15.0)
Immature Granulocytes: 19 %
Lymphocytes Relative: 5 %
Lymphs Abs: 0.5 10*3/uL — ABNORMAL LOW (ref 0.7–4.0)
MCH: 30.5 pg (ref 26.0–34.0)
MCHC: 28.8 g/dL — ABNORMAL LOW (ref 30.0–36.0)
MCV: 106 fL — ABNORMAL HIGH (ref 80.0–100.0)
Monocytes Absolute: 0.4 10*3/uL (ref 0.1–1.0)
Monocytes Relative: 4 %
Neutro Abs: 7.3 10*3/uL (ref 1.7–7.7)
Neutrophils Relative %: 72 %
Platelets: 163 10*3/uL (ref 150–400)
RBC: 2.85 MIL/uL — ABNORMAL LOW (ref 3.87–5.11)
RDW: 21.8 % — ABNORMAL HIGH (ref 11.5–15.5)
WBC: 10.2 10*3/uL (ref 4.0–10.5)
nRBC: 6.7 % — ABNORMAL HIGH (ref 0.0–0.2)

## 2020-12-01 LAB — COMPREHENSIVE METABOLIC PANEL
ALT: 18 U/L (ref 0–44)
AST: 24 U/L (ref 15–41)
Albumin: 2.8 g/dL — ABNORMAL LOW (ref 3.5–5.0)
Alkaline Phosphatase: 49 U/L (ref 38–126)
Anion gap: 4 — ABNORMAL LOW (ref 5–15)
BUN: 31 mg/dL — ABNORMAL HIGH (ref 8–23)
CO2: 38 mmol/L — ABNORMAL HIGH (ref 22–32)
Calcium: 8.3 mg/dL — ABNORMAL LOW (ref 8.9–10.3)
Chloride: 101 mmol/L (ref 98–111)
Creatinine, Ser: 0.65 mg/dL (ref 0.44–1.00)
GFR, Estimated: >60 mL/min (ref >60)
Glucose, Bld: 133 mg/dL — ABNORMAL HIGH (ref 70–99)
Potassium: 4.6 mmol/L (ref 3.5–5.1)
Sodium: 143 mmol/L (ref 135–145)
Total Bilirubin: 0.8 mg/dL (ref 0.3–1.2)
Total Protein: 5.4 g/dL — ABNORMAL LOW (ref 6.5–8.1)

## 2020-12-01 LAB — PHOSPHORUS: Phosphorus: 3.4 mg/dL (ref 2.5–4.6)

## 2020-12-01 LAB — D-DIMER, QUANTITATIVE: D-Dimer, Quant: 1.47 ug/mL-FEU — ABNORMAL HIGH (ref 0.00–0.50)

## 2020-12-01 LAB — FERRITIN: Ferritin: 104 ng/mL (ref 11–307)

## 2020-12-01 LAB — C-REACTIVE PROTEIN: CRP: 8.7 mg/dL — ABNORMAL HIGH (ref <1.0)

## 2020-12-01 NOTE — Progress Notes (Signed)
SLP Cancellation Note  Patient Details Name: [name] MRN: [id number] DOB: 11/02/1928   Cancelled treatment:       Reason Eval/Treat Not Completed: Patient's level of consciousness (agitated earlier per NSG; resting now). NSG reported pt awakens at times and can calmly participate in swallowing her Pills Crushed in Applesauce; few bites and sips intermittently when alert/calm. Noted MD notes, discussions re: lung masses, lymphadenopathy, pleural effusions, and meaningful recovery. Family is scheduled to speak w/ Palliative Care re: [place], hospice support. ST services will continue to monitor pt's status next 1-2 days for any further needs. Encouraged oral care and supplement drink, Nepro if pt is agreeable. NSG agreed.       Orinda Kenner, MS, CCC-SLP Speech Language Pathologist Rehab Services [phone] [facility] 12/01/2020, 11:59 AM

## 2020-12-01 NOTE — Progress Notes (Signed)
PROGRESS NOTE    [name]   QAS:[id number]  DOB: 25-May-1928  PCP: Tracie Harrier, MD    DOA: 11/29/2020 LOS: 6    Brief Narrative / Hospital Course to Date:   Per H&P "87 y.o. female with medical history significant of COPD, depression, myelofibrosis, history of subarachnoid hemorrhage, history of tobacco abuse, left eye blindness, hard of hearing, history of anterior cerebral artery aneurysm, essential hypertension among other things who was apparently sent over from [facility] with congestion, of shortness of breath.  She was initially assisted with oxygen sats in the 60s.  After warming up in the ER her oxygen sat was 79% on room air.  Patient placed on oxygen.  She is talking in complete sentences but seem to have exertional dyspnea.  Associated with some fever.  Patient also has some basal crackles with some mild pedal edema.  Work-up showed that patient meets sepsis criteria with a white count of 16.2.  Some tachycardia as well as lactic acid elevated.  She also has hypoxia with oxygen sats 79% on room air.  She has elevated BNP which is new with findings suggestive of congestive heart failure but more importantly patient was found to have COVID-19 positive results with new lung lesions concerning for new malignancy.  She is therefore being admitted to the hospital for further evaluation and treatment.    Patient was found to be positive for Covid-19.  Chest xray and CT chest with contrast showed RML and RLL consolidations consistent with ?aspiration pneumonia and a LUL mass.  Admitted and started on remdesevir, steroids, antibiotics.  Pulmonology consulted.  Assessment & Plan   Principal Problem:   Sepsis due to COVID-19 [facility]) Active Problems:   Acute on chronic respiratory failure with hypoxia (HCC)   COPD (chronic obstructive pulmonary disease) (HCC)   Myelofibrosis (HCC)   HTN (hypertension)   Goals of care, counseling/discussion   Depression   Vascular  dementia without behavioral disturbance ([place])   Pneumonia due to COVID-19 virus   Acute diastolic CHF (congestive heart failure) (HCC)   Hypokalemia   Lung mass   Protein-calorie malnutrition, severe   Sepsis due to Covid-19 vs aspiration pneumonia with  Acute respiratory failure with hypoxia and hypercapnea  Continue remdesivir, steroids and antibiotics Supplement O2, maintain sat > 90%, wean as tolerated. (Pt does not require O2 at baseline). BiPAP as needed Monitor inflammatory markers, CMP, CBC Pulmonary hygiene with I-S and flutter SLP evaluation - see their recommendations PCCM following.  CODE STATUS 10/24: Made DNR, see PCCM note --Palliative care consulted, appreciate assistance --Continue GOC discussions --Family meeting planned for tomorrow at 12:30 PM.  Acute metabolic encephalopathy -likely ICU delirium.  Patient having significant agitation and restlessness. -- On Precedex drip per PCCM --Haldol as needed --Treat pain as needed --Palliative care consulted --Delirium precautions  New onset A-fib - given amiodarone bolus overnight 10/23-24.  Likely triggered by above acute illness.   Has hx of SAH, will defer anticoagulation at this time given high risk.  Bilateral pleural effusions - new/progressed since initial CXR.  Treated with Lasix overnight.  Consider thoracentesis.  Further diuresis as needed.  Hypernatremia - Resolved.  Due to poor PO intake secondary to confusion and acute illness. Na 146 on 10/23.  Improved without use of D5w. --Encourage PO intake --Monitor BMP  Hypomagnesemia - replacing 4g IV Mg-sulfate for Mg 1.5 (10/23).  Monitor and replace as needed, goal Mg>2.   New LUL lung mass - pulmonology consulted. Dr. Tasia Catchings,  patient's oncologist with whom she follows for myelofibrosis therapy has been updated on this finding. --Evaluation on hold until recovered from acute illness  Myelofibrosis - Dr. Tasia Catchings aware of admission, advised to hold patient's  Jakafi.  Thrombocytopenia -likely due to acute infection.  Monitor CBC.  Essential hypertension continue home lisinopril and amlodipine.   Hypokalemia: Replaced potassium. Monitor and replace as needed   COPD with mild exacerbation: Secondary to acute infection.  Continue with steroids and breathing treatments   Vascular dementia: At baseline.  No acute issues or behavioral disturbance noted. Delirium precautions.   ?New onset CHF: Suspected diastolic dysfunction.   Echo pending - follow up Monitor volume status   Depression with anxiety: continue Celexa  Normocytic anemia: due to chronic disease.  Hbg stable. Follow CBC's  GERD - continue PPI    Patient BMI: Body mass index is 19.76 kg/m.   DVT prophylaxis: enoxaparin (LOVENOX) injection 30 mg Start: 11/26/20 0800   Diet:  Diet Orders (From admission, onward)     Start     Ordered   11/28/20 1308  DIET DYS 3 Room service appropriate? Yes with Assist; Fluid consistency: Thin  Diet effective now       Comments: NO STRAWS!!!  Extra Gravy on chopped meats. CREAM SOUPS.  Question Answer Comment  Room service appropriate? Yes with Assist   Fluid consistency: Thin      11/28/20 1308              Code Status: DNR   Subjective 12/01/20    Pt seen in ICU this Am.  She has reportedly had significant agitation and restlessness, now requiring Precedex drip.  Also given Haldol earlier this morning and when seen on rounds she is very restless, intermittently yelling out help me and similar things.  She pulls off nasal cannula, attempting to pull out IV lines.  She did answer yes when asked if having pain and calm down significantly once morphine was given.  Disposition Plan & Communication   Status is: Inpatient  Remains inpatient appropriate because: remains on significant supplemental oxygen and IV therapies required.  Due to advanced age, chronic comorbidities and severity of acute illness, patient is at high risk  for morbidity and mortality.  Family Communication: Sister Freda Munro at bedside on rounds today 10/25.     Consults, Procedures, Significant Events   Consultants:  pulmonology  Procedures:  none  Antimicrobials:  Anti-infectives (From admission, onward)    Start     Dose/Rate Route Frequency Ordered Stop   11/27/20 2100  cefTRIAXone (ROCEPHIN) 1 g in sodium chloride 0.9 % 100 mL IVPB        1 g 200 mL/hr over 30 Minutes Intravenous Every 24 hours 11/26/20 0546 11/30/20 2320   11/27/20 1000  remdesivir 100 mg in sodium chloride 0.9 % 100 mL IVPB  Status:  Discontinued       See Hyperspace for full Linked Orders Report.   100 mg 200 mL/hr over 30 Minutes Intravenous Daily 11/26/20 0529 11/26/20 0543   11/27/20 1000  remdesivir 100 mg in sodium chloride 0.9 % 100 mL IVPB       See Hyperspace for full Linked Orders Report.   100 mg 200 mL/hr over 30 Minutes Intravenous Daily 11/08/2020 2322 11/30/20 1107   11/26/20 0530  remdesivir 200 mg in sodium chloride 0.9% 250 mL IVPB  Status:  Discontinued       See Hyperspace for full Linked Orders Report.   200 mg  580 mL/hr over 30 Minutes Intravenous Once 11/26/20 0529 11/26/20 0543   11/26/20 0529  azithromycin (ZITHROMAX) 500 mg in sodium chloride 0.9 % 250 mL IVPB  Status:  Discontinued        500 mg 250 mL/hr over 60 Minutes Intravenous Every 24 hours 11/26/20 0529 11/26/20 0543   11/26/20 0529  cefTRIAXone (ROCEPHIN) 1 g in sodium chloride 0.9 % 100 mL IVPB  Status:  Discontinued        1 g 200 mL/hr over 30 Minutes Intravenous Every 24 hours 11/26/20 0529 11/26/20 0546   11/26/20 0100  remdesivir 200 mg in sodium chloride 0.9% 250 mL IVPB       See Hyperspace for full Linked Orders Report.   200 mg 580 mL/hr over 30 Minutes Intravenous Once 11/12/2020 2322 11/26/20 0329   11/08/2020 2100  cefTRIAXone (ROCEPHIN) 2 g in sodium chloride 0.9 % 100 mL IVPB  Status:  Discontinued        2 g 200 mL/hr over 30 Minutes Intravenous Every 24 hours  11/07/2020 2057 11/26/20 0546   11/08/2020 2100  azithromycin (ZITHROMAX) 500 mg in sodium chloride 0.9 % 250 mL IVPB        500 mg 250 mL/hr over 60 Minutes Intravenous Every 24 hours 12/01/2020 2057 11/29/20 2103         Micro    Objective   Vitals:   12/01/20 1300 12/01/20 1400 12/01/20 1500 12/01/20 1600  BP: 133/60 (!) 154/59 (!) 141/63 (!) 152/60  Pulse: (!) 56 (!) 59 (!) 58 64  Resp: 20 20 17  (!) 24  Temp:    (!) 96.9 F (36.1 C)  TempSrc:    Axillary  SpO2: 100% 97% 100% 92%  Weight:      Height:        Intake/Output Summary (Last 24 hours) at 12/01/2020 1633 Last data filed at 12/01/2020 1400 Gross per 24 hour  Intake 344.52 ml  Output 650 ml  Net -305.48 ml   Filed Weights   11/07/2020 1908 11/29/20 0027  Weight: 44.5 kg 45.9 kg    Physical Exam:  General exam: agitated, intermittently yelling out, restless, pulling off nasal cannula HEENT: moist mucus membranes clear conjunctiva Respiratory system: Exam limited by patient's voice but overall improved rhonchi, respiratory effort appears normal, on 5 L/min HFNC oxygen with sats 100% on monitor Cardiovascular system: normal S1/S2, RRR, lower extremity edema improved   Central nervous system: normal speech, no gross focal neurologic deficits Extremities: Mittens on bilateral hands, diffuse patchy ecchymosis and hyperpigmentation of BLE's, BLE pitting edema resolved Psychiatric: Agitated, delirious, congruent affect abnormal judgment and insight  Labs   Data Reviewed: I have personally reviewed following labs and imaging studies  CBC: Recent Labs  Lab 11/27/20 0408 11/28/20 0520 11/29/20 0544 11/30/20 0515 12/01/20 0429  WBC 11.7* 12.4* 11.6* 14.3* 10.2  NEUTROABS 8.2* 8.8* 8.6* 10.1* 7.3  HGB 9.2* 9.4* 9.2* 10.1* 8.7*  HCT 31.0* 31.6* 31.4* 35.1* 30.2*  MCV 104.4* 103.9* 103.3* 102.9* 106.0*  PLT 123* 126* 125* 164 625   Basic Metabolic Panel: Recent Labs  Lab 11/27/20 0408 11/28/20 0520  11/29/20 0544 11/30/20 0515 12/01/20 0429  NA 143 143 146* 143 143  K 4.0 4.0 3.6 3.5 4.6  CL 107 106 102 99 101  CO2 31 31 36* 38* 38*  GLUCOSE 85 102* 113* 86 133*  BUN 20 21 20  24* 31*  CREATININE 0.76 0.77 0.58 0.74 0.65  CALCIUM 8.1* 8.3* 8.1* 8.1* 8.3*  MG  --   --  1.5* 2.7*  --   PHOS  --   --   --  3.6 3.4   GFR: Estimated Creatinine Clearance: 33.6 mL/min (by C-G formula based on SCr of 0.65 mg/dL). Liver Function Tests: Recent Labs  Lab 11/27/20 0408 11/28/20 0520 11/29/20 0544 11/30/20 0515 12/01/20 0429  AST 28 27 26 26 24   ALT 18 18 19 19 18   ALKPHOS 52 51 47 51 49  BILITOT 0.7 0.6 0.6 0.7 0.8  PROT 5.8* 6.0* 5.6* 6.1* 5.4*  ALBUMIN 3.1* 3.3* 3.0* 3.3* 2.8*   No results for input(s): LIPASE, AMYLASE in the last 168 hours. No results for input(s): AMMONIA in the last 168 hours. Coagulation Profile: No results for input(s): INR, PROTIME in the last 168 hours. Cardiac Enzymes: No results for input(s): CKTOTAL, CKMB, CKMBINDEX, TROPONINI in the last 168 hours. BNP (last 3 results) No results for input(s): PROBNP in the last 8760 hours. HbA1C: No results for input(s): HGBA1C in the last 72 hours. CBG: Recent Labs  Lab 11/27/20 0652 11/29/20 0009  GLUCAP 124* 129*   Lipid Profile: No results for input(s): CHOL, HDL, LDLCALC, TRIG, CHOLHDL, LDLDIRECT in the last 72 hours. Thyroid Function Tests: No results for input(s): TSH, T4TOTAL, FREET4, T3FREE, THYROIDAB in the last 72 hours. Anemia Panel: Recent Labs    11/30/20 0515 12/01/20 0429  FERRITIN 108 104   Sepsis Labs: Recent Labs  Lab 11/24/2020 2107 11/17/2020 2352  PROCALCITON 0.18  --   LATICACIDVEN  --  0.7    Recent Results (from the past 240 hour(s))  Culture, blood (single)     Status: None   Collection Time: 11/23/2020  9:07 PM   Specimen: BLOOD  Result Value Ref Range Status   Specimen Description BLOOD RIGHT ASSIST CONTROL  Final   Special Requests   Final    BOTTLES DRAWN  AEROBIC AND ANAEROBIC Blood Culture adequate volume   Culture   Final    NO GROWTH 5 DAYS Performed at [facility], [address] [place], [place] [address]    Report Status 11/30/2020 FINAL  Final  Resp Panel by RT-PCR (Flu A&B, Covid) Nasopharyngeal Swab     Status: Abnormal   Collection Time: 11/16/2020  9:07 PM   Specimen: Nasopharyngeal Swab; Nasopharyngeal(NP) swabs in vial transport medium  Result Value Ref Range Status   SARS Coronavirus 2 by RT PCR POSITIVE (A) NEGATIVE Final    Comment: RESULT CALLED TO, READ BACK BY AND VERIFIED WITH: MICHELE ROMERO@[id number]  11/09/2020 RH (NOTE) SARS-CoV-2 target nucleic acids are DETECTED.  The SARS-CoV-2 RNA is generally detectable in upper respiratory specimens during the acute phase of infection. Positive results are indicative of the presence of the identified virus, but do not rule out bacterial infection or co-infection with other pathogens not detected by the test. Clinical correlation with patient history and other diagnostic information is necessary to determine patient infection status. The expected result is Negative.  Fact Sheet for Patients: EntrepreneurPulse.com.au  Fact Sheet for Healthcare Providers: IncredibleEmployment.be  This test is not yet approved or cleared by the [place] FDA and  has been authorized for detection and/or diagnosis of SARS-CoV-2 by FDA under an Emergency Use Authorization (EUA).  This EUA will remain in effect (meaning this test can be u sed) for the duration of  the COVID-19 declaration under Section 564(b)(1) of the Act, 21 U.S.C. section 360bbb-3(b)(1), unless the authorization is terminated or revoked sooner.  Influenza A by PCR NEGATIVE NEGATIVE Final   Influenza B by PCR NEGATIVE NEGATIVE Final    Comment: (NOTE) The Xpert Xpress SARS-CoV-2/FLU/RSV plus assay is intended as an aid in the diagnosis of influenza from Nasopharyngeal swab  specimens and should not be used as a sole basis for treatment. Nasal washings and aspirates are unacceptable for Xpert Xpress SARS-CoV-2/FLU/RSV testing.  Fact Sheet for Patients: EntrepreneurPulse.com.au  Fact Sheet for Healthcare Providers: IncredibleEmployment.be  This test is not yet approved or cleared by the [place] FDA and has been authorized for detection and/or diagnosis of SARS-CoV-2 by FDA under an Emergency Use Authorization (EUA). This EUA will remain in effect (meaning this test can be used) for the duration of the COVID-19 declaration under Section 564(b)(1) of the Act, 21 U.S.C. section 360bbb-3(b)(1), unless the authorization is terminated or revoked.  Performed at [facility], [address], [place], [place] [address]       Imaging Studies   DG Chest [place] 1 View  Result Date: 11/30/2020 CLINICAL DATA:  Left lung mass EXAM: PORTABLE CHEST 1 VIEW COMPARISON:  Prior chest x-ray 11/29/2020 FINDINGS: Stable masslike opacity in the left mid lung. Persistent pulmonary vascular congestion with at least mild pulmonary edema. Increasing patchy airspace opacities throughout the right perihilar region and right upper lobe. Enlarging layering right pleural effusion and associated right basilar atelectasis. Probable small to moderate left pleural effusion as well. No pneumothorax. Stable cardiomegaly. Atherosclerotic calcifications present in the transverse aorta. IMPRESSION: 1. Worsening multifocal patchy airspace opacities throughout the right lung concerning for multifocal pneumonia. 2. Cardiomegaly, pulmonary vascular congestion and at least mild pulmonary edema suggests an element of CHF. 3. Enlarging right layering pleural effusion and associated right basilar atelectasis. 4. Suspect small left pleural effusion as well. 5. Redemonstration of left lung mass. Electronically Signed   By: Jacqulynn Cadet M.D.   On: 11/30/2020  09:03     Medications   Scheduled Meds:  amLODipine  5 mg Oral Daily   Chlorhexidine Gluconate Cloth  6 each Topical Daily   citalopram  20 mg Oral Daily   dexamethasone (DECADRON) injection  6 mg Intravenous Q24H   enoxaparin (LOVENOX) injection  30 mg Subcutaneous Q24H   feeding supplement  237 mL Oral TID BM   Ipratropium-Albuterol  1 puff Inhalation Q6H   lisinopril  30 mg Oral Daily   multivitamin with minerals  1 tablet Oral Daily   pantoprazole  40 mg Oral Daily   polyethylene glycol  17 g Oral Daily   senna  1 tablet Oral QHS   Continuous Infusions:  dexmedetomidine (PRECEDEX) IV infusion 0.8 mcg/kg/hr (12/01/20 1517)       LOS: 6 days    Time spent: 30 minutes    Ezekiel Slocumb, DO [facility]  12/01/2020, 4:33 PM      If 7PM-7AM, please contact night-coverage. How to contact the [facility] Attending or Consulting provider [place] or covering provider during after hours [place], for this patient?    Check the care team in [facility] and look for a) attending/consulting [facility] provider listed and b) the [facility] team listed Log into [email or web address] and use [place]'s universal password to access. If you do not have the password, please contact the hospital operator. Locate the [facility] provider you are looking for under [facility] and page to a number that you can be directly reached. If you still have difficulty reaching the provider, please page the [facility] (Director on Call)  for the Hospitalists listed on amion for assistance.

## 2020-12-01 NOTE — Progress Notes (Signed)
Palliative Care Progress Note, Assessment & Plan   Patient Name: [name]       Date: 12/01/2020 DOB: [birth year]  Age: 87 y.o. MRN#: [id number] Attending Physician: Ezekiel Slocumb, DO Primary Care Physician: Tracie Harrier, MD Admit Date: 11/11/2020  I have reviewed medical records including EPIC notes, labs and imaging, received report from nursing, assessed the patient and then met with patient's sister [name] at bedside. She deferred decision making to patient's sister [name].   I spoke with [name] on the phone this afternoon.I introduced Palliative Medicine as specialized medical care for people living with serious illness. It focuses on providing relief from the symptoms and stress of a serious illness. The goal is to improve quality of life for both the patient and the family.  I shared that I would like to facilitate a [place] meeting for the patient with Debbie. She agreed and said she will be at the hospital tomorrow.  [place] family meeting set for 12:30pm tomorrow, 12/02/20.  [place] Ilsa Iha, FNP-BC Palliative Medicine Team Team Phone # [phone]  NO CHARGE

## 2020-12-01 NOTE — Progress Notes (Addendum)
Date: 12/01/2020,   MRN# [id number] [name] [birth year]     AdmissionWeight: 44.5 kg                 CurrentWeight: 45.9 kg    Referrng physician: Dr Arbutus Ped  CHIEF COMPLAINT:   Mass of left lung   HISTORY OF PRESENT ILLNESS   [name] is a 87 y.o. female with medical history significant of COPD, depression, myelofibrosis, history of subarachnoid hemorrhage, history of tobacco abuse, left eye blindness, hard of hearing, history of anterior cerebral artery aneurysm, essential hypertension among other things who was apparently sent over from [facility] with congestion, of shortness of breath.  She was found to have COVID19 + She was initially assisted with oxygen sats in the 60s.  After warming up in the ER her oxygen sat was 79% on room air.  Patient placed on oxygen.  She is talking in complete sentences but seem to have exertional dyspnea.  Associated with some fever.  Patient also has some basal crackles with some mild pedal edema.  Work-up showed that patient meets sepsis criteria with a white count of 16.2.  Some tachycardia as well as lactic acid elevated.  She also has hypoxia with oxygen sats 79% on room air.   Temperature is 98.6, blood pressure 178/67, pulse 59, respirate of 19 oxygen sats 79% on room air.  White count 16.2 hemoglobin 10.7 platelets 153.  Sodium 141 potassium 3.4 chloride 105 CO2 is 28 BUN 19 creatinine 0.86 and calcium 8.2.  Troponin is flat at 30.  BNP of 639.  Procalcitonin 0.18.  COVID-19 screen is positive but influenza negative.  Urinalysis currently pending.  Chest x-ray showed nodular opacity in the left upper lobe possibly mass versus mild patchy right lower lobe opacity also trace pleural effusion.  CT chest with contrast showed spiculated left upper lobe 3.2 cm lung mass concerning for carcinoma.  Also additional smaller 1 in the right upper lobe about 11 mm.  There is cardiomegaly with small right greater than left pleural effusions.   Groundglass density and patchy consolidation in the right lower lobe and right middle lobe possibly aspiration pneumonia.  Absent emphysema.  PCCM consultation for additional guidance regarding lung mass. I met with brother of patient and we discussed findings together with patient. We discussed that its probably safest to have full scope of therapy for COVID prior to biopsy of any kind.    Interval HPI: -Overnight Remained Encephalopathic (restless, removing medical equipment), placed on Precedex gtt and intermittent morphine pushes -Afebrile, Hemodynamically stable, no Vasopressors -Dr. Verlee Monte had goals of care discussion with family ~ elected to make pt DNR/DNI -Awaiting family discussion with palliative care regarding direction of care going forward (aggressive care vs. Comfort/home with hospice), and whether they wish to proceed with Thoracentesis  PAST MEDICAL HISTORY   Past Medical History:  Diagnosis Date   Aneurysm of anterior cerebral artery    Blindness of left eye    secondary to cataract surgery complication   Depression    Hypertension    Myelofibrosis ([place])    SAH (subarachnoid hemorrhage) ([place]) 02/2006   Tobacco abuse      SURGICAL HISTORY   Past Surgical History:  Procedure Laterality Date   ABDOMINAL HYSTERECTOMY     ANEURYSM COILING     CATARACT EXTRACTION     CHOLECYSTECTOMY       FAMILY HISTORY   Family History  Problem Relation Age of Onset   Breast cancer  Daughter 67   Breast cancer Sister    Throat cancer Mother    Lung cancer Father    Prostate cancer Brother    Leukemia Brother    Prostate cancer Brother    Leukemia Brother      SOCIAL HISTORY   Social History   Tobacco Use   Smoking status: Former    Types: Cigarettes    Quit date: 07/31/2015    Years since quitting: 5.3   Smokeless tobacco: Never  Vaping Use   Vaping Use: Never used  Substance Use Topics   Alcohol use: Yes    Alcohol/week: 1.0 standard drink    Types: 1 Glasses  of wine per week    Comment: once a month   Drug use: No     MEDICATIONS    Home Medication:  REM   Current Medication:  Current Facility-Administered Medications:    amLODipine (NORVASC) tablet 5 mg, 5 mg, Oral, Daily, Nicole Kindred A, DO, 5 mg at 11/30/20 1257   Chlorhexidine Gluconate Cloth 2 % PADS 6 each, 6 each, Topical, Daily, Ezekiel Slocumb, DO, 6 each at 11/30/20 1037   chlorpheniramine-HYDROcodone (TUSSIONEX) 10-8 MG/5ML suspension 5 mL, 5 mL, Oral, Q12H PRN, Elwyn Reach, MD, 5 mL at 11/27/20 1940   citalopram (CELEXA) tablet 20 mg, 20 mg, Oral, Daily, Nicole Kindred A, DO, 20 mg at 11/30/20 1257   dexamethasone (DECADRON) injection 6 mg, 6 mg, Intravenous, Q24H, Garba, Mohammad L, MD, 6 mg at 12/01/20 0402   dexmedetomidine (PRECEDEX) 400 MCG/100ML (4 mcg/mL) infusion, 0.4-1.2 mcg/kg/hr, Intravenous, Titrated, Maryjane [facility], MD, Last Rate: 9.18 mL/hr at 12/01/20 0900, 0.8 mcg/kg/hr at 12/01/20 0900   enoxaparin (LOVENOX) injection 30 mg, 30 mg, Subcutaneous, Q24H, Garba, Mohammad L, MD, 30 mg at 12/01/20 0800   feeding supplement (ENSURE ENLIVE / ENSURE PLUS) liquid 237 mL, 237 mL, Oral, TID BM, Arbutus Ped, Kelly A, DO, 237 mL at 11/30/20 1256   guaiFENesin-dextromethorphan (ROBITUSSIN DM) 100-10 MG/5ML syrup 10 mL, 10 mL, Oral, Q4H PRN, Jonelle Sidle, Mohammad L, MD   haloperidol lactate (HALDOL) injection 2 mg, 2 mg, Intravenous, Q6H PRN, Nicole Kindred A, DO, 2 mg at 12/01/20 0757   Ipratropium-Albuterol (COMBIVENT) respimat 1 puff, 1 puff, Inhalation, Q6H, Garba, Mohammad L, MD, 1 puff at 12/01/20 0300   lisinopril (ZESTRIL) tablet 30 mg, 30 mg, Oral, Daily, Nicole Kindred A, DO, 30 mg at 11/30/20 1256   morphine 2 MG/ML injection 0.5 mg, 0.5 mg, Intravenous, Q6H PRN, Rust-Chester, Toribio Harbour L, NP, 0.5 mg at 12/01/20 0941   multivitamin with minerals tablet 1 tablet, 1 tablet, Oral, Daily, Arbutus Ped, Kelly A, DO   ondansetron (ZOFRAN) tablet 4 mg, 4 mg, Oral, Q6H PRN  **OR** ondansetron (ZOFRAN) injection 4 mg, 4 mg, Intravenous, Q6H PRN, Jonelle Sidle, Mohammad L, MD   pantoprazole (PROTONIX) EC tablet 40 mg, 40 mg, Oral, Daily, Nicole Kindred A, DO, 40 mg at 11/30/20 1257   polyethylene glycol (MIRALAX / GLYCOLAX) packet 17 g, 17 g, Oral, Daily, Nicole Kindred A, DO, 17 g at 11/30/20 1256   senna (SENOKOT) tablet 8.6 mg, 1 tablet, Oral, QHS, Griffith, Kelly A, DO, 8.6 mg at 11/27/20 2325    ALLERGIES   Patient has no known allergies.     REVIEW OF SYSTEMS    Review of Systems:  Unable to obtain in setting of her encephalopathy   VS: BP 131/71   Pulse (!) 54   Temp (!) 97 F (36.1 C)   Resp 20  Ht 5' (1.524 m)   Wt 45.9 kg   SpO2 100%   BMI 19.76 kg/m      PHYSICAL EXAM   General appearance: 87 y.o., female, lightly sedated on precedex, Acute on chronically ill appearing, very thin and frail, in NAD HENT: NCAT;dry MM, Neck: Trachea midline; no lymphadenopathy, no JVD Lungs: Diminished breath sounds throughout, even, nonlabored CV: Bradycardia, regular rhythm, no MRGs  Abdomen: Soft, non-tender; non-distended, BS present  Extremities: No peripheral edema, radial and DP pulses present bilaterally  Skin: Normal temperature, turgor and texture; some ecchymosis over BLE      IMAGING    DG Chest 1 View  Result Date: 11/29/2020 CLINICAL DATA:  Hypoxia. EXAM: CHEST  1 VIEW COMPARISON:  Chest radiograph dated 12/01/2020. CT dated 11/15/2020 FINDINGS: A 3.5 x 2.8 cm ovoid opacity in the left mid lung field corresponds to the mass seen on the prior CT. There is background of emphysema. Diffuse interstitial coarsening and patchy density in the right upper lobe. Small bilateral pleural effusions and bibasilar atelectasis or infiltrate. Stable cardiac enlargement. Atherosclerotic calcification of the aorta. No pneumothorax. No acute osseous pathology. IMPRESSION: 1. A 3.5 x 2.8 cm ovoid opacity in the left mid lung field corresponds to the  mass seen on the prior CT. 2. Small bilateral pleural effusions and bibasilar atelectasis or infiltrate. 3. Increased diffuse interstitial prominence and airspace density throughout the right lung. Electronically Signed   By: Anner Crete M.D.   On: 11/29/2020 00:55   DG Chest 2 View  Result Date: 11/15/2020 CLINICAL DATA:  Shortness of breath EXAM: CHEST - 2 VIEW COMPARISON:  04/28/2020 FINDINGS: Nodular opacity in the left upper lobe, possibly associated with the anterior left 3rd rib. However, if pulmonary, this would be suspicious for an mass. Mild patchy right lower lobe opacity, chronic, likely atelectasis versus scarring. Trace left pleural effusion. The heart is top-normal in size.  Thoracic aortic atherosclerosis. IMPRESSION: Nodular opacity in the left upper lobe, possibly osseous, mass not excluded. Consider CT chest with contrast for further evaluation lobe. Mild patchy right lower lobe opacity, favoring atelectasis versus scarring. Trace left pleural effusion. Electronically Signed   By: Julian Hy M.D.   On: 12/05/2020 19:39   CT Chest W Contrast  Result Date: 11/15/2020 CLINICAL DATA:  Abnormal chest x-ray EXAM: CT CHEST WITH CONTRAST TECHNIQUE: Multidetector CT imaging of the chest was performed during intravenous contrast administration. CONTRAST:  9m OMNIPAQUE IOHEXOL 300 MG/ML  SOLN COMPARISON:  Chest x-ray 12/05/2020, 07/11/2016, CT abdomen 12/04/2018 FINDINGS: Cardiovascular: Advanced aortic atherosclerosis. No aneurysmal dilatation. No dissection seen. Cardiomegaly. Coronary vascular calcification. No pericardial effusion Mediastinum/Nodes: Midline trachea. No thyroid mass. Nonspecific mediastinal lymph nodes measuring up to 8 mm in the precarinal space. Esophagus within normal limits. Lungs/Pleura: Emphysema. Small right greater than left pleural effusions. 3.2 by 2.7 by 2.2 cm spiculated left upper lobe lung mass. Smaller slightly spiculated right upper lobe lung nodule  measuring 11 by 10 by 10 mm, series 3, image 63. Bilateral lower lobe bronchial wall thickening with mild mucous plugging in the right lower lobe. Patchy consolidation in the right lower lobe and right middle lobe. No visible pneumothorax. Upper Abdomen: Incompletely visualized cyst exophytic to the upper pole right kidney. Adrenal gland adenomas. Incompletely visualized spleen appears enlarged. Musculoskeletal: No acute osseous abnormality. Chronic compression fracture at L1 IMPRESSION: 1. Spiculated left upper lobe 3.2 cm lung mass concerning for carcinoma. There is an additional smaller spiculated suspicious nodule in the right upper  lobe measuring 11 mm. 2. Cardiomegaly with small right greater than left pleural effusions. Lower lobe bronchial wall thickening with mild mucous plugging in right lower lobe. Ground-glass density and patchy consolidation in the right lower lobe and right middle lobe potentially due to aspiration or pneumonia. 3. Emphysema 4. Incompletely visualized splenomegaly.  Adrenal gland adenomas. Aortic Atherosclerosis (ICD10-I70.0) and Emphysema (ICD10-J43.9). Electronically Signed   By: Donavan Foil M.D.   On: 11/19/2020 22:09   DG Chest Port 1 View  Result Date: 11/30/2020 CLINICAL DATA:  Left lung mass EXAM: PORTABLE CHEST 1 VIEW COMPARISON:  Prior chest x-ray 11/29/2020 FINDINGS: Stable masslike opacity in the left mid lung. Persistent pulmonary vascular congestion with at least mild pulmonary edema. Increasing patchy airspace opacities throughout the right perihilar region and right upper lobe. Enlarging layering right pleural effusion and associated right basilar atelectasis. Probable small to moderate left pleural effusion as well. No pneumothorax. Stable cardiomegaly. Atherosclerotic calcifications present in the transverse aorta. IMPRESSION: 1. Worsening multifocal patchy airspace opacities throughout the right lung concerning for multifocal pneumonia. 2. Cardiomegaly,  pulmonary vascular congestion and at least mild pulmonary edema suggests an element of CHF. 3. Enlarging right layering pleural effusion and associated right basilar atelectasis. 4. Suspect small left pleural effusion as well. 5. Redemonstration of left lung mass. Electronically Signed   By: Jacqulynn Cadet M.D.   On: 11/30/2020 09:03   ECHOCARDIOGRAM COMPLETE  Result Date: 11/26/2020    ECHOCARDIOGRAM REPORT   Patient Name:   [name] Date of Exam: 11/26/2020 Medical Rec #:  [id number]          Height:       60.0 in Accession #:    [id number]         Weight:       98.0 lb Date of Birth:  Aug 20, 1928          BSA:          1.378 m Patient Age:    59 years           BP:           139/57 mmHg Patient Gender: F                  HR:           83 bpm. Exam Location:  [facility] Procedure: 2D Echo, Cardiac Doppler and Color Doppler Indications:     CHF-acute diastolic V78.58  History:         Patient has prior history of Echocardiogram examinations, most                  recent 04/29/2020. Risk Factors:Hypertension. Tobacco abuse.  Sonographer:     Sherrie Sport Referring Phys:  [place] Diagnosing Phys: Ida Rogue MD IMPRESSIONS  1. Left ventricular ejection fraction, by estimation, is 55 to 60%. The left ventricle has normal function. The left ventricle has no regional wall motion abnormalities. Left ventricular diastolic parameters are indeterminate.  2. Right ventricular systolic function is low normal. The right ventricular size is severely enlarged. There is moderately elevated pulmonary artery systolic pressure. The estimated right ventricular systolic pressure is 85.0 mmHg.  3. Left atrial size was mildly dilated.  4. Right atrial size was moderately dilated.  5. The mitral valve is normal in structure. No evidence of mitral valve regurgitation. No evidence of mitral stenosis.  6. Tricuspid valve regurgitation is moderate.  7. The aortic valve was not well visualized. Aortic  valve  regurgitation is mild to moderate. Mild to moderate aortic valve sclerosis/calcification is present, without any evidence of aortic stenosis.  8. The inferior vena cava is normal in size with greater than 50% respiratory variability, suggesting right atrial pressure of 3 mmHg.  9. Large left pleural effusion noted FINDINGS  Left Ventricle: Left ventricular ejection fraction, by estimation, is 55 to 60%. The left ventricle has normal function. The left ventricle has no regional wall motion abnormalities. The left ventricular internal cavity size was normal in size. There is  no left ventricular hypertrophy. Left ventricular diastolic parameters are indeterminate. Right Ventricle: The right ventricular size is severely enlarged. No increase in right ventricular wall thickness. Right ventricular systolic function is low normal. There is moderately elevated pulmonary artery systolic pressure. The tricuspid regurgitant velocity is 3.60 m/s, and with an assumed right atrial pressure of 5 mmHg, the estimated right ventricular systolic pressure is 48.5 mmHg. Left Atrium: Left atrial size was mildly dilated. Right Atrium: Right atrial size was moderately dilated. Pericardium: There is no evidence of pericardial effusion. Mitral Valve: The mitral valve is normal in structure. There is mild thickening of the mitral valve leaflet(s). Mild mitral annular calcification. No evidence of mitral valve regurgitation. No evidence of mitral valve stenosis. Tricuspid Valve: The tricuspid valve is normal in structure. Tricuspid valve regurgitation is moderate . No evidence of tricuspid stenosis. Aortic Valve: The aortic valve was not well visualized. Aortic valve regurgitation is mild to moderate. Aortic regurgitation PHT measures 414 msec. Mild to moderate aortic valve sclerosis/calcification is present, without any evidence of aortic stenosis.  Aortic valve mean gradient measures 3.0 mmHg. Aortic valve peak gradient measures 6.7 mmHg.  Aortic valve area, by VTI measures 4.11 cm. Pulmonic Valve: The pulmonic valve was normal in structure. Pulmonic valve regurgitation is not visualized. No evidence of pulmonic stenosis. Aorta: The aortic root is normal in size and structure. Venous: The inferior vena cava is normal in size with greater than 50% respiratory variability, suggesting right atrial pressure of 3 mmHg. IAS/Shunts: No atrial level shunt detected by color flow Doppler.  LEFT VENTRICLE PLAX 2D LVIDd:         5.10 cm   Diastology LVIDs:         2.90 cm   LV e' medial:  6.85 cm/s LV PW:         1.20 cm   LV e' lateral: 11.00 cm/s LV IVS:        0.90 cm LVOT diam:     2.00 cm LV SV:         90 LV SV Index:   65 LVOT Area:     3.14 cm  RIGHT VENTRICLE RV S prime:     11.90 cm/s TAPSE (M-mode): 4.6 cm LEFT ATRIUM              Index        RIGHT ATRIUM           Index LA diam:        3.90 cm  2.83 cm/m   RA Area:     26.60 cm LA Vol (A2C):   102.0 ml 74.00 ml/m  RA Volume:   97.30 ml  70.59 ml/m LA Vol (A4C):   67.4 ml  48.90 ml/m LA Biplane Vol: 82.6 ml  59.92 ml/m  AORTIC VALVE                     PULMONIC VALVE AV  Area (Vmax):    3.92 cm      PV Vmax:        0.63 m/s AV Area (Vmean):   4.63 cm      PV Peak grad:   1.6 mmHg AV Area (VTI):     4.11 cm      RVOT Peak grad: 3 mmHg AV Vmax:           129.00 cm/s AV Vmean:          75.300 cm/s AV VTI:            0.218 m AV Peak Grad:      6.7 mmHg AV Mean Grad:      3.0 mmHg LVOT Vmax:         161.00 cm/s LVOT Vmean:        111.000 cm/s LVOT VTI:          0.285 m LVOT/AV VTI ratio: 1.31 AI PHT:            414 msec  AORTA Ao Root diam: 3.30 cm TRICUSPID VALVE TR Peak grad:   51.8 mmHg TR Vmax:        360.00 cm/s  SHUNTS Systemic VTI:  0.29 m Systemic Diam: 2.00 cm Ida Rogue MD Electronically signed by Ida Rogue MD Signature Date/Time: 11/26/2020/8:49:32 PM    Final       ASSESSMENT/PLAN   Left lung mass RUL lung nodule  3.2 cm spiculated left upper lobe lung mass.  Borderline adenopathy, bilateral R>L effusions. -if sufficient pocket could entertain diagnostic thoracentesis but overall further evaluation TBD pending clinical trajectory and ongoing goals of care discussion  Acute Hypoxic respiratory failure in setting of Acute COVID19 pneumonia, Acute on chronic diastolic heart failure, Bilateral Pleural Effusions, & Pulmonary Hypertension PMHx of COPD -Supplemental O2 as needed to maintain O2 sats >88% -Pt is now DNR/DNI -Follow intermittent CXR & ABG as needed -Ensure pulmonary hygiene with IS and flutter valve as able -Bronchodilators via MDI -Maintain euvolemia to net negative fluid balance as able -Remdesivir, plan for 5 days -IV Steroids (Decadron 6 mg IV daily) -Follow inflammatory markers: Ferritin, D-dimer, CRP -Vitamin C, zinc -Maintain airborne and contact precautions  New onset atrial fibrillation -Continuous cardiac monitoring -Maintain MAP >65 -IV fluids -Vasopressors as needed to maintain MAP goal -Lactic acid normal (0.7) -HS Troponin minimally elevated x2 (30 ~ 30) -Echocardiogram 11/26/20 with LVEF 55-60%, indeterminate diastolic parameters, RV systolic function low normal, moderate pulmonary artery hypertension - AC per primary but given onset in setting acute illness and history of SAH would probably hold off  Bilateral pleural effusions Could be transudate in setting diastolic dysfunction vs parapneumonic vs malignant. Does not appear to have been present on CXR from 04/2020. - would continue to diurese for net negative fluid balance - possible thoracentesis today depending on GoC   Acute Metabolic Encephalopathy, suspect due to COVID PMHx of Vascular Dementia, depression, anxiety -Provide supportive care -Pain control -Precedex as needed -Avoid sedating meds as able  PMHx of Myelofibrosis -Follows with Dr. Tasia Catchings outpatient  Anemia of Chronic Disease -Monitor for S/Sx of bleeding -Trend CBC -Lovenox for VTE  Prophylaxis  -Transfuse for Hgb <7          Care Time: 38 minutes  Darel Hong, AGACNP-BC [facility] epic messenger for cross cover needs If after hours, please call E-link

## 2020-12-02 DIAGNOSIS — F0150 Vascular dementia without behavioral disturbance: Secondary | ICD-10-CM

## 2020-12-02 DIAGNOSIS — J189 Pneumonia, unspecified organism: Secondary | ICD-10-CM

## 2020-12-02 DIAGNOSIS — Z66 Do not resuscitate: Secondary | ICD-10-CM

## 2020-12-02 DIAGNOSIS — J449 Chronic obstructive pulmonary disease, unspecified: Secondary | ICD-10-CM

## 2020-12-02 DIAGNOSIS — U071 COVID-19: Secondary | ICD-10-CM | POA: Diagnosis not present

## 2020-12-02 DIAGNOSIS — I5020 Unspecified systolic (congestive) heart failure: Secondary | ICD-10-CM | POA: Diagnosis not present

## 2020-12-02 DIAGNOSIS — J9621 Acute and chronic respiratory failure with hypoxia: Secondary | ICD-10-CM | POA: Diagnosis not present

## 2020-12-02 DIAGNOSIS — Z7189 Other specified counseling: Secondary | ICD-10-CM

## 2020-12-02 DIAGNOSIS — Z515 Encounter for palliative care: Secondary | ICD-10-CM

## 2020-12-02 DIAGNOSIS — J1282 Pneumonia due to coronavirus disease 2019: Secondary | ICD-10-CM

## 2020-12-02 LAB — COMPREHENSIVE METABOLIC PANEL
ALT: 19 U/L (ref 0–44)
AST: 26 U/L (ref 15–41)
Albumin: 3.2 g/dL — ABNORMAL LOW (ref 3.5–5.0)
Alkaline Phosphatase: 51 U/L (ref 38–126)
Anion gap: 5 (ref 5–15)
BUN: 30 mg/dL — ABNORMAL HIGH (ref 8–23)
CO2: 38 mmol/L — ABNORMAL HIGH (ref 22–32)
Calcium: 8.3 mg/dL — ABNORMAL LOW (ref 8.9–10.3)
Chloride: 100 mmol/L (ref 98–111)
Creatinine, Ser: 0.61 mg/dL (ref 0.44–1.00)
GFR, Estimated: >60 mL/min (ref >60)
Glucose, Bld: 122 mg/dL — ABNORMAL HIGH (ref 70–99)
Potassium: 4.5 mmol/L (ref 3.5–5.1)
Sodium: 143 mmol/L (ref 135–145)
Total Bilirubin: 1 mg/dL (ref 0.3–1.2)
Total Protein: 5.9 g/dL — ABNORMAL LOW (ref 6.5–8.1)

## 2020-12-02 LAB — CBC
HCT: 32 % — ABNORMAL LOW (ref 36.0–46.0)
Hemoglobin: 9.2 g/dL — ABNORMAL LOW (ref 12.0–15.0)
MCH: 29.8 pg (ref 26.0–34.0)
MCHC: 28.8 g/dL — ABNORMAL LOW (ref 30.0–36.0)
MCV: 103.6 fL — ABNORMAL HIGH (ref 80.0–100.0)
Platelets: 177 10*3/uL (ref 150–400)
RBC: 3.09 MIL/uL — ABNORMAL LOW (ref 3.87–5.11)
RDW: 21.8 % — ABNORMAL HIGH (ref 11.5–15.5)
WBC: 12.7 10*3/uL — ABNORMAL HIGH (ref 4.0–10.5)
nRBC: 2.7 % — ABNORMAL HIGH (ref 0.0–0.2)

## 2020-12-02 LAB — FERRITIN: Ferritin: 90 ng/mL (ref 11–307)

## 2020-12-02 LAB — D-DIMER, QUANTITATIVE: D-Dimer, Quant: 1.51 ug/mL-FEU — ABNORMAL HIGH (ref 0.00–0.50)

## 2020-12-02 LAB — MAGNESIUM: Magnesium: 2.3 mg/dL (ref 1.7–2.4)

## 2020-12-02 LAB — C-REACTIVE PROTEIN: CRP: 5.4 mg/dL — ABNORMAL HIGH (ref <1.0)

## 2020-12-02 MED ORDER — ACETAMINOPHEN 325 MG PO TABS: 650.0000 mg | ORAL_TABLET | Freq: Four times a day (QID) | ORAL | Status: DC | PRN

## 2020-12-02 MED ORDER — DEXTROSE 5 % IV SOLN: INTRAVENOUS | Status: DC

## 2020-12-02 MED ORDER — HALOPERIDOL LACTATE 5 MG/ML IJ SOLN: 2.5000 mg | INTRAMUSCULAR | Status: DC | PRN

## 2020-12-02 MED ORDER — LORAZEPAM 2 MG/ML IJ SOLN
2.0000 mg | INTRAMUSCULAR | Status: DC | PRN
  Administered 2020-12-02: 4 mg via INTRAVENOUS
  Administered 2020-12-02: 2 mg via INTRAVENOUS
  Administered 2020-12-03: 4 mg via INTRAVENOUS
  Filled 2020-12-02: qty 1
  Filled 2020-12-02 (×3): qty 2

## 2020-12-02 MED ORDER — POLYVINYL ALCOHOL 1.4 % OP SOLN
1.0000 [drp] | Freq: Four times a day (QID) | OPHTHALMIC | Status: DC | PRN
  Filled 2020-12-02: qty 15

## 2020-12-02 MED ORDER — DIAZEPAM 5 MG/ML IJ SOLN
2.5000 mg | Freq: Four times a day (QID) | INTRAMUSCULAR | Status: DC | PRN
  Administered 2020-12-02: 2.5 mg via INTRAVENOUS
  Filled 2020-12-02: qty 2

## 2020-12-02 MED ORDER — ACETAMINOPHEN 650 MG RE SUPP
650.0000 mg | Freq: Four times a day (QID) | RECTAL | Status: DC | PRN
  Filled 2020-12-02: qty 1

## 2020-12-02 MED ORDER — GLYCOPYRROLATE 0.2 MG/ML IJ SOLN
0.2000 mg | INTRAMUSCULAR | Status: DC | PRN
  Filled 2020-12-02: qty 1

## 2020-12-02 MED ORDER — GLYCOPYRROLATE 1 MG PO TABS
1.0000 mg | ORAL_TABLET | ORAL | Status: DC | PRN
  Filled 2020-12-02: qty 1

## 2020-12-02 MED ORDER — MORPHINE SULFATE (PF) 2 MG/ML IV SOLN
1.0000 mg | Freq: Four times a day (QID) | INTRAVENOUS | Status: DC | PRN
  Administered 2020-12-02: 1 mg via INTRAVENOUS
  Administered 2020-12-03: 2 mg via INTRAVENOUS
  Filled 2020-12-02 (×2): qty 1

## 2020-12-02 MED ORDER — DIPHENHYDRAMINE HCL 50 MG/ML IJ SOLN: 25.0000 mg | INTRAMUSCULAR | Status: DC | PRN

## 2020-12-02 MED ORDER — MORPHINE SULFATE (PF) 2 MG/ML IV SOLN: 1.0000 mg | Freq: Four times a day (QID) | INTRAVENOUS | Status: DC | PRN

## 2020-12-02 NOTE — Progress Notes (Signed)
BRIEF CRITICAL CARE NOTE  Palliative care met with pt's family today, and decision made to focus more on comfort, with NO ESCALATION of Care.  IV Precedex to be weaned off and transitioned IV Ativan, and morphine frequency increased for pain/dyspnea.  Eventual disposition is for discharge to hospice facility.  Given that pt's family has elected to change goals of care, will not pursue Thoracentesis and further workup.   PCCM will sign off at this time.     Darel Hong, AGACNP-BC [facility] Prefer epic messenger for cross cover needs If after hours, please call E-link

## 2020-12-02 NOTE — Assessment & Plan Note (Signed)
Comfort care now °

## 2020-12-02 NOTE — TOC Progression Note (Signed)
Transition of Care [facility]) - Progression Note    Patient Details  Name: [name] MRN: [id number] Date of Birth: Nov 13, 1928  Transition of Care [facility]) CM/SW [place], [place] Phone Number: 12/02/2020, 1:34 PM  Clinical Narrative:     Family spoke with Palliative Care NP, on [place].  Family has decided on hospice house w/ Manufacturing engineer. COVID quarantine period ends on 12/05/2020 and [facility] will evaluate patient for placement once quarantine period ends. Main contact Georgiann Mohs (Daughter) [phone] (Mobile).  Expected Discharge Plan: [place] Barriers to Discharge: Continued Medical Work up  Expected Discharge Plan and Services Expected Discharge Plan: [place] In-house Referral: Clinical Social Work     Living arrangements for the past 2 months: Single Family Home                                       Social Determinants of Health (SDOH) Interventions    Readmission Risk Interventions No flowsheet data found.

## 2020-12-02 NOTE — Progress Notes (Signed)
SLP Cancellation Note  Patient Details Name: [name] MRN: [id number] DOB: 1928/02/18   Cancelled treatment:       Reason Eval/Treat Not Completed:  (chart reviewed; consulted Palliative Care) Due to decline in status and overall presentation since transfer to CCU level of care, Family has decided pt is to admit to the [facility] when COVID+ quarantine is complete. Pt is not eating/drinking but has pleasure po's if desires per chart and Palliative Care GOC.  ST services will sign off at this time w/ MD to reconsult if new needs arise while admitted.      Orinda Kenner, MS, CCC-SLP Speech Language Pathologist Rehab Services [phone] [facility] 12/02/2020, 2:59 PM

## 2020-12-02 NOTE — Assessment & Plan Note (Signed)
Comfort care  °

## 2020-12-02 NOTE — Progress Notes (Signed)
  Progress Note    [name]   OEU:[id number]  DOB: Dec 20, 1928  DOA: 11/08/2020     7 Date of Service: 12/02/2020   Clinical Course  87 y.o. female with medical history significant of COPD, depression, myelofibrosis, history of subarachnoid hemorrhage, history of tobacco abuse, left eye blindness, hard of hearing, history of anterior cerebral artery aneurysm, essential hypertension among other things who was apparently sent over from [place] clinic with congestion, of shortness of breath.  She was initially assisted with oxygen sats in the 60s.  After warming up in the ER her oxygen sat was 79% on room air.  Patient placed on oxygen.  She is talking in complete sentences but seem to have exertional dyspnea.  Associated with some fever.  Patient also has some basal crackles with some mild pedal edema.  Work-up showed that patient meets sepsis criteria with a white count of 16.2.  Some tachycardia as well as lactic acid elevated.  She also has hypoxia with oxygen sats 79% on room air.  She has elevated BNP which is new with findings suggestive of congestive heart failure but more importantly patient was found to have COVID-19 positive results with new lung lesions concerning for new malignancy.  She is therefore being admitted to the hospital for further evaluation and treatment.     Patient was found to be positive for Covid-19.  Chest xray and CT chest with contrast showed RML and RLL consolidations consistent with ?aspiration pneumonia and a LUL mass.  Admitted and started on remdesevir, steroids, antibiotics.  Pulmonology consulted.  10/26 - comfort care initiated after family meeting with Palliative care. Hospice home in 2 days   Assessment and Plan * Sepsis due to COVID-19 [facility]) Comfort care now.   Protein-calorie malnutrition, severe Comfort care   Hospice care After family meeting with Palliative care - Comfort care now. Hospice home D/C on 10/29 due to covid  isolation.    COPD (chronic obstructive pulmonary disease) ([place]) Comfort care   Acute on chronic respiratory failure with hypoxia (HCC) Comfort care     Subjective:  sleepy  Objective Vitals:   12/02/20 1400 12/02/20 1500 12/02/20 1600 12/02/20 1700  BP: (!) 153/63 140/69 (!) 155/64 (!) 163/61  Pulse: (!) 57 (!) 56 (!) 59 (!) 56  Resp: 18 19 18 19   Temp:   (!) 97 F (36.1 C)   TempSrc:   Axillary   SpO2: 94% 90% 92% 100%  Weight:      Height:       45.9 kg  Vital signs were reviewed and unremarkable.   Exam Physical Exam   General exam: 78 y f lying in bed looking comfortable HEENT: moist mucus membranes clear conjunctiva Respiratory system: Exam limited by patient's voice but overall improved rhonchi, respiratory effort appears normal, on 5 L/min HFNC oxygen with sats 100% on monitor Cardiovascular system: normal S1/S2, RRR, lower extremity edema improved   Central nervous system: normal speech, no gross focal neurologic deficits Extremities: Mittens on bilateral hands, diffuse patchy ecchymosis and hyperpigmentation of BLE's, BLE pitting edema resolved Psychiatric: normal mood  Labs / Other Information There are no new results to review at this time.   Disposition Plan: Status is: Inpatient  Remains inpatient appropriate because: waiting for hospice home bed and covid isolation to be done. Likely D/C on 10/29    Time spent: 30 minutes [facility] 12/02/2020, 9:59 PM

## 2020-12-02 NOTE — TOC Initial Note (Addendum)
Transition of Care [facility]) - Initial/Assessment Note    Patient Details  Name: [name] MRN: [id number] Date of Birth: [birth year]  Transition of Care [facility]) CM/SW Contact:    Ova Freshwater Phone Number: [phone] 12/02/2020, 7:55 AM  Clinical Narrative:                  Patient presents from [facility] due to SOB, w/ RA sats in the 60's. Patient stated she was "lying around in bed for a few days". Does not wear O2 at home. Is independent with ADLs.  Main contact Georgiann Mohs (Daughter)  [phone] (Mobile). Palliative Care NP will meet with patient's daughter today 12/02/2020 for [place] care conversation.  TOC will continue to follow.  Expected Discharge Plan: Skilled Nursing Facility Barriers to Discharge: Continued Medical Work up   Patient Goals and CMS Choice        Expected Discharge Plan and Services Expected Discharge Plan: [place] In-house Referral: Clinical Social Work     Living arrangements for the past 2 months: [place]                                      Prior Living Arrangements/Services Living arrangements for the past 2 months: Single Family Home Lives with:: Self Patient language and need for interpreter reviewed:: Yes Do you feel safe going back to the place where you live?: Yes      Need for Family Participation in Patient Care: Yes (Comment) Care giver support system in place?: Yes (comment)   Criminal Activity/Legal Involvement Pertinent to Current Situation/Hospitalization: No - Comment as needed  Activities of Daily Living Home Assistive Devices/Equipment: Environmental consultant (specify type), Shower chair with back, Eyeglasses ADL Screening (condition at time of admission) Patient's cognitive ability adequate to safely complete daily activities?: Yes Is the patient deaf or have difficulty hearing?: No Does the patient have difficulty seeing, even when wearing glasses/contacts?: Yes Does the patient have  difficulty concentrating, remembering, or making decisions?: No Patient able to express need for assistance with ADLs?: Yes Does the patient have difficulty dressing or bathing?: Yes Independently performs ADLs?: No Communication: Independent Dressing (OT): Needs assistance Is this a change from baseline?: Change from baseline, expected to last <3days Grooming: Needs assistance Is this a change from baseline?: Change from baseline, expected to last <3 days Feeding: Independent Bathing: Needs assistance Is this a change from baseline?: Pre-admission baseline Toileting: Needs assistance Is this a change from baseline?: Change from baseline, expected to last <3 days In/Out Bed: Needs assistance Is this a change from baseline?: Change from baseline, expected to last <3 days Walks in Home: Independent Does the patient have difficulty walking or climbing stairs?: Yes Weakness of Legs: Both Weakness of Arms/Hands: Both  Permission Sought/Granted Permission sought to share information with : Family Supports Permission granted to share information with : Yes, Verbal Permission Granted  Share Information with NAME: Georgiann Mohs (Daughter)   [phone] (Mobile)           Emotional Assessment Appearance:: Appears stated age Attitude/Demeanor/Rapport: Unable to Assess Affect (typically observed): Unable to Assess   Alcohol / Substance Use: Not Applicable Psych Involvement: No (comment)  Admission diagnosis:  SOB (shortness of breath) [R06.02] Lung nodule [R91.1] Hypoxia [R09.02] Community acquired pneumonia, unspecified laterality [B71.6] Systolic congestive heart failure, unspecified HF chronicity ([place]) [I50.20] Sepsis due to COVID-19 ([place]) [U07.1, A41.89] COVID [U07.1] Patient Active  Problem List   Diagnosis Date Noted   Protein-calorie malnutrition, severe 11/30/2020   Pneumonia due to COVID-19 virus 66/07/3014   Acute diastolic CHF (congestive heart failure) ([place]) 11/12/2020    Sepsis due to COVID-19 ([place]) 11/30/2020   Hypokalemia 11/29/2020   Lung mass 11/15/2020   Elevated troponin 04/29/2020   Subdural hematoma 04/29/2020   Accidental fall 04/29/2020   Periorbital contusion, left, initial encounter 04/29/2020   Goals of care, counseling/discussion 03/24/2020   Weight loss 03/24/2020   Tobacco use 03/01/2019   Myelofibrosis ([place]) 02/25/2018   JAK-2 gene mutation 02/25/2018   HTN (hypertension) 02/25/2018   Encounter for antineoplastic chemotherapy 12/27/2017   Acute on chronic respiratory failure with hypoxia ([place]) 07/11/2016   COPD (chronic obstructive pulmonary disease) ([place]) 07/11/2016   Vascular dementia without behavioral disturbance ([place]) 12/03/2015   Depression 07/08/2013   PCP:  Tracie Harrier, MD Pharmacy:   [facility] [place], [place] [address] [address] Phone: [phone] Fax: [place], [address] [place] [address] Phone: [phone] Fax: [phone]  [facility] [place], [place] [address] [place] [address] Phone: [phone] Fax: [phone]     Social Determinants of Health (SDOH) Interventions    Readmission Risk Interventions No flowsheet data found.

## 2020-12-02 NOTE — Assessment & Plan Note (Addendum)
After family meeting with Palliative care - Comfort care now. Hospice home D/C on 10/29 due to covid isolation.

## 2020-12-02 NOTE — Hospital Course (Addendum)
87 y.o. female with medical history significant of COPD, depression, myelofibrosis, history of subarachnoid hemorrhage, history of tobacco abuse, left eye blindness, hard of hearing, history of anterior cerebral artery aneurysm, essential hypertension among other things who was apparently sent over from [place] clinic with congestion, of shortness of breath.  She was initially assisted with oxygen sats in the 60s.  After warming up in the ER her oxygen sat was 79% on room air.  Patient placed on oxygen.  She is talking in complete sentences but seem to have exertional dyspnea.  Associated with some fever.  Patient also has some basal crackles with some mild pedal edema.  Work-up showed that patient meets sepsis criteria with a white count of 16.2.  Some tachycardia as well as lactic acid elevated.  She also has hypoxia with oxygen sats 79% on room air.  She has elevated BNP which is new with findings suggestive of congestive heart failure but more importantly patient was found to have COVID-19 positive results with new lung lesions concerning for new malignancy.  She is therefore being admitted to the hospital for further evaluation and treatment.     Patient was found to be positive for Covid-19.  Chest xray and CT chest with contrast showed RML and RLL consolidations consistent with ?aspiration pneumonia and a LUL mass.  Admitted and started on remdesevir, steroids, antibiotics.  Pulmonology consulted.  10/26 - comfort care initiated after family meeting with Palliative care. Hospice home in 2 days 10/27 - Hospice Home on 10/29 due to covid isolation

## 2020-12-02 NOTE — Consult Note (Signed)
Consultation Note Date: 12/02/2020   Patient Name: [name]  DOB: [birth year]  MRN: [id number]  Age / Sex: 87 y.o., female  PCP: Tracie Harrier, MD Referring Physician: Max Sane, MD  Reason for Consultation: Establishing goals of care  HPI/Patient Profile: 87 y.o. female  with past medical history of COPD, depression, myelofibrosis, history of subarachnoid hemorrhage, tobacco use, left eye blindness, hard of hearing, anterior cerebral arterial aneurysm, and hypertension admitted on 11/29/2020 after a visit to the [facility] with complaints of congestion and shortness of breath with.  She was found to be COVID-positive.  Subsequent testing after admission revealed via CT a new left upper lung mass concerning for cancer, a mass in her right upper lobe, and right lower and mid pneumonia.  X-ray also revealed worsening multifocal patchy opacities and an enlarging right pleural effusion.  During her hospitalization patient has deteriorated.  She has become agitated and has increased demands for Haldol, morphine, resulting in use of a Precedex drip.  Palliative medicine was consulted for goals of care.  Patient is a DNR.  Clinical Assessment and Goals of Care: I have reviewed medical records including EPIC notes, labs and imaging, received report from bedside nurse Larene Beach, assessed the patient and then met with patient's family including: daughter and decision maker [name], brother Campbell Lerner, grandson Fleeta Emmer, son and his wifge  to discuss diagnosis prognosis, GOC, EOL wishes, disposition and options.  I introduced Palliative Medicine as specialized medical care for people living with serious illness. It focuses on providing relief from the symptoms and stress of a serious illness. The goal is to improve quality of life for both the patient and the family.  I gave a brief overview of the patient's  complex comorbidities as well as current health status.  I reviewed that in addition to her underlying COPD, her COVID infection, she also has new lung masses and pneumonia that all complicate her respiratory status. We discussed a brief life review of the patient.  As per the patient's daughter [name] patient enjoyed going to the beach and spending time with her family.  [name] shares that the patient would not really listen to any other family member but her brother Sammy who is present today.  As far as functional and nutritional status prior to admission family endorses patient was alert oriented and able to complete all ADLs independently.  She just celebrated her 89th birthday with a large party with family and friends.  Family asked appropriate questions regarding what treatment options are available.  I outlined that aggressive medical measures would include changing the patient to a full CODE STATUS, with the potential for a cardiopulmonary arrest or respiratory arrest resulting in CPR, cardiogenic shock, use of a breathing tube and ventilator, and patient being on life support.  As Dr. Bjorn Loser had explained to [name] a few days ago, these aggressive measures would perhaps do more trauma than benefit to the patient.  I educated the family that this would not change her underlying issues.  I also  shared that there is a small percentage of patients with her age and her comorbidities that survive cardiopulmonary resuscitation and recover to any meaningful level of awareness.  The family had many concerns regarding feeding and hydration.  I outlined the poor prognostic indicators for PEG placement including age being greater than 79, COPD, previous aspiration, hospitalization, low albumin, low BMI, and bedridden state.  I also outlined the burdens and complications associated with PEG tube placement including wound dehiscence, tube migration, pain at the tube site, potential for diarrhea and nausea  vomiting in potential aspiration pneumonia, bowel obstructions, tube malfunction, and use of restraints for patients who could not fully comprehend what a tube in their stomach is therefore.  Also highlighted that the patient is already pulled out all of her IVs and attempts to continually get out of the mittens.  I shared my concern that should we place a PEG tube that she would actually have no better quality of life, her survival rate would not increase, and that PEG tubes would likely not increase mortality in patients like her.  Also shared that should the patient be put on a ventilator and have a PEG tube that her level of care would be such that she would not be able to return home.  The family shared they are not able to care for her as she is and if we did these higher levels of intervention then she would most assuredly have to be placed somewhere.  The family was in agreement that they would not want to place a PEG tube and that the patient would remain a DNR.  The difference between this higher level of medical intervention and a comfort pathway was outlined.  I discussed that a comfort pathway would include measures to keep her clean, dry, and addressed her symptoms such as pain, shortness of breath, anxiety, and agitation.  I educated the family that a comfort pathway would mean no more lab draws, escalation of care, and further diagnostic or radiographic imagery.  I shared that a comfort pathway includes comfort feeds, which means patient can eat and drink whenever she asked for with the understanding that there is a high potential for aspiration.   Given that the family cannot manage the patient's care at home we reviewed that hospice inpatient facility could be a possibility for her at discharge.  Family is familiar with hospice inpatient units.  They are in agreement to have her moved to hospice inpatient facility if patient is eligible.   In summary I reviewed with the family that we  will: -continue to keep the patient comfortable -not escalate care at this time -patient may be moved to a different unit in the hospital but that her care would remain consistent -the hospice liaison to evaluate patient and may be in touch with family -transition off of precedex to Ativan IV -increase frequency of morphine PRN for pain/dyspnea  Hospice liaison shared that patient will need to remain in hospital until at least 10/29 when COVID restrictions are lifted.  Discussed with family the importance of continued conversation with family and the medical providers regarding overall plan of care and treatment options, ensuring decisions are within the context of the patient's values and GOCs.    Questions and concerns were addressed. The family was encouraged to call with questions or concerns.   Primary Decision Maker NEXT OF KIN  Code Status/Advance Care Planning: DNR  Prognosis:   Less than 2 weeks  Discharge Planning: Hospice facility  Primary Diagnoses: Present on Admission:  Acute on chronic respiratory failure with hypoxia (HCC)  COPD (chronic obstructive pulmonary disease) (HCC)  HTN (hypertension)  Myelofibrosis (HCC)  Depression  Vascular dementia without behavioral disturbance (HCC)  Pneumonia due to COVID-19 virus  Acute diastolic CHF (congestive heart failure) ([place])  Sepsis due to COVID-19 ([place])  Hypokalemia  Lung mass   Physical Exam Constitutional:      Appearance: She is ill-appearing. She is not toxic-appearing.  HENT:     Head: Normocephalic and atraumatic.  Cardiovascular:     Rate and Rhythm: Normal rate.  Pulmonary:     Effort: Pulmonary effort is normal.     Comments: Destats quickly without oxygen in place Skin:    General: Skin is warm and dry.  Neurological:     Mental Status: She is disoriented.  Psychiatric:        Mood and Affect: Mood is anxious.        Behavior: Behavior is agitated.    Vital Signs: BP (!) 153/63   Pulse (!)  57   Temp (!) 96.8 F (36 C) (Axillary)   Resp 18   Ht 5' (1.524 m)   Wt 45.9 kg   SpO2 94%   BMI 19.76 kg/m  Pain Scale: PAINAD   Pain Score: 0-No pain SpO2: SpO2: 94 % O2 Device:SpO2: 94 % O2 Flow Rate: .O2 Flow Rate (L/min): 5 L/min  Palliative Assessment/Data: 20%     I discussed this patient's plan of care with Dr. Manuella Ghazi, [place], [place].  Thank you for this consult. Palliative medicine will continue to follow and assist holistically.   Time Total: 70 minutes Greater than 50%  of this time was spent counseling and coordinating care related to the above assessment and plan.  Signed by: Jordan Hawks, DNP, FNP-BC Palliative Medicine    Please contact Palliative Medicine Team phone at [phone] for questions and concerns.  For individual provider: See Shea Evans

## 2020-12-03 LAB — COMPREHENSIVE METABOLIC PANEL
ALT: 21 U/L (ref 0–44)
AST: 36 U/L (ref 15–41)
Albumin: 3.7 g/dL (ref 3.5–5.0)
Alkaline Phosphatase: 67 U/L (ref 38–126)
Anion gap: 8 (ref 5–15)
BUN: 27 mg/dL — ABNORMAL HIGH (ref 8–23)
CO2: 39 mmol/L — ABNORMAL HIGH (ref 22–32)
Calcium: 8.6 mg/dL — ABNORMAL LOW (ref 8.9–10.3)
Chloride: 98 mmol/L (ref 98–111)
Creatinine, Ser: 0.71 mg/dL (ref 0.44–1.00)
GFR, Estimated: >60 mL/min (ref >60)
Glucose, Bld: 97 mg/dL (ref 70–99)
Potassium: 4.1 mmol/L (ref 3.5–5.1)
Sodium: 145 mmol/L (ref 135–145)
Total Bilirubin: 1.2 mg/dL (ref 0.3–1.2)
Total Protein: 6.9 g/dL (ref 6.5–8.1)

## 2020-12-03 LAB — CBC
HCT: 35 % — ABNORMAL LOW (ref 36.0–46.0)
Hemoglobin: 10.3 g/dL — ABNORMAL LOW (ref 12.0–15.0)
MCH: 30.5 pg (ref 26.0–34.0)
MCHC: 29.4 g/dL — ABNORMAL LOW (ref 30.0–36.0)
MCV: 103.6 fL — ABNORMAL HIGH (ref 80.0–100.0)
Platelets: 203 10*3/uL (ref 150–400)
RBC: 3.38 MIL/uL — ABNORMAL LOW (ref 3.87–5.11)
RDW: 22.5 % — ABNORMAL HIGH (ref 11.5–15.5)
WBC: 19.8 10*3/uL — ABNORMAL HIGH (ref 4.0–10.5)
nRBC: 8.5 % — ABNORMAL HIGH (ref 0.0–0.2)

## 2020-12-03 LAB — D-DIMER, QUANTITATIVE: D-Dimer, Quant: 1.72 ug/mL-FEU — ABNORMAL HIGH (ref 0.00–0.50)

## 2020-12-03 LAB — FERRITIN: Ferritin: 103 ng/mL (ref 11–307)

## 2020-12-03 LAB — C-REACTIVE PROTEIN: CRP: 3.7 mg/dL — ABNORMAL HIGH (ref <1.0)

## 2020-12-03 NOTE — Assessment & Plan Note (Signed)
Comfort care  °

## 2020-12-03 NOTE — Assessment & Plan Note (Signed)
Actively dying. Hospice home on 10/29 if still alive

## 2020-12-03 NOTE — Progress Notes (Signed)
Manufacturing engineer (ACC)  Notified by [facility] manager that family would like Hospice Home for Ms. Massoud.  Due to her covid status, we cannot accept her in our facility. Isolation set to end on 10/29.  Once her isolation has ended, we can assess her for eligibility for residential hospice.  ACC will continue to follow.  Venia Carbon BSN, RN [facility] Liaison

## 2020-12-03 NOTE — Progress Notes (Signed)
[name] Informed by nursing ,[name] on comfort care passed away [birth year] on this day 12/27/2022. Death pronounced by nursing staff per order

## 2020-12-03 NOTE — Progress Notes (Signed)
  Progress Note    [name]   HKF:[id number]  DOB: Sep 27, 1928  DOA: 11/08/2020     8 Date of Service: 12/03/2020   Clinical Course  87 y.o. female with medical history significant of COPD, depression, myelofibrosis, history of subarachnoid hemorrhage, history of tobacco abuse, left eye blindness, hard of hearing, history of anterior cerebral artery aneurysm, essential hypertension among other things who was apparently sent over from [place] clinic with congestion, of shortness of breath.  She was initially assisted with oxygen sats in the 60s.  After warming up in the ER her oxygen sat was 79% on room air.  Patient placed on oxygen.  She is talking in complete sentences but seem to have exertional dyspnea.  Associated with some fever.  Patient also has some basal crackles with some mild pedal edema.  Work-up showed that patient meets sepsis criteria with a white count of 16.2.  Some tachycardia as well as lactic acid elevated.  She also has hypoxia with oxygen sats 79% on room air.  She has elevated BNP which is new with findings suggestive of congestive heart failure but more importantly patient was found to have COVID-19 positive results with new lung lesions concerning for new malignancy.  She is therefore being admitted to the hospital for further evaluation and treatment.     Patient was found to be positive for Covid-19.  Chest xray and CT chest with contrast showed RML and RLL consolidations consistent with ?aspiration pneumonia and a LUL mass.  Admitted and started on remdesevir, steroids, antibiotics.  Pulmonology consulted.  10/26 - comfort care initiated after family meeting with Palliative care. Hospice home in 2 days 10/27 - [place] on 10/29 due to covid isolation   Assessment and Plan * Sepsis due to COVID-19 [facility]) Comfort care   Protein-calorie malnutrition, severe Comfort care.  Hospice care Actively dying. Hospice home on 10/29 if still alive   COPD  (chronic obstructive pulmonary disease) ([place]) Comfort care.   Acute on chronic respiratory failure with hypoxia ([place]) Comfort care.     Subjective:  Actively dying  Objective Vitals:   12/02/20 1600 12/02/20 1700 12/02/20 2217 12/03/20 0700  BP: (!) 155/64 (!) 163/61 (!) 157/79 (!) 122/56  Pulse: (!) 59 (!) 56 61 79  Resp: 18 19  18   Temp: (!) 97 F (36.1 C)  97.6 F (36.4 C) 97.6 F (36.4 C)  TempSrc: Axillary     SpO2: 92% 100% 100%   Weight:      Height:       45.9 kg  Vital signs were reviewed and unremarkable except for: Blood pressure: high and low HR    Exam Physical Exam   General exam:89 y f lying in bed looking comfortable, actively dying HEENT: moist mucus membranes clear conjunctiva Respiratory system: Cheyne stokes respirations Cardiovascular system:normal S1/S2, RRR, lower extremity edema  Central nervous system: no gross focal neurologic deficits Extremities:Mittens on bilateral hands,diffuse patchy ecchymosis and hyperpigmentation of BLE's  Labs / Other Information There are no new results to review at this time.   Disposition Plan: Status is: Inpatient  Remains inpatient appropriate because: actively dying, await hospice home placement on 10/29 if still alive    D/w daughter at bedside    Time spent: 25 minutes [facility] 12/03/2020, 5:08 PM

## 2020-12-08 NOTE — Death Summary Note (Signed)
  DEATH SUMMARY   Patient Details  Name: [name] MRN: [id number] DOB: 11/23/1928  Admission/Discharge Information   Admit Date:  12-03-20  Date of Death: Date of Death: Dec 11, 2020  Time of Death: Time of Death: 2098-05-01  Length of Stay: April 24, 2022  Code Status:   Referring Physician: Tracie Harrier, MD    Reason(s) for Hospitalization  Shortness of breath Diagnoses  Preliminary cause of death:   Pneumonia due to COVID-19 Secondary Diagnoses (including complications and co-morbidities):  Principal Problem:   Sepsis due to COVID-19 [facility]) Active Problems:   Acute on chronic respiratory failure with hypoxia ([place])   COPD (chronic obstructive pulmonary disease) ([place])   Myelofibrosis ([place])   HTN (hypertension)   Hospice care   Depression   Vascular dementia without behavioral disturbance ([place])   Pneumonia due to COVID-19 virus   Acute diastolic CHF (congestive heart failure) ([place])   Hypokalemia   Lung mass   Protein-calorie malnutrition, severe    Brief Hospital Course  87 y.o. female with medical history significant of COPD, depression, myelofibrosis, history of subarachnoid hemorrhage, history of tobacco abuse, left eye blindness, hard of hearing, history of anterior cerebral artery aneurysm, essential hypertension among other things who was apparently sent over from [facility] with congestion, of shortness of breath.  She was initially assisted with oxygen sats in the 60s.  After warming up in the ER her oxygen sat was 79% on room air.  Patient placed on oxygen.  She is talking in complete sentences but seem to have exertional dyspnea.  Associated with some fever.  Patient also has some basal crackles with some mild pedal edema.  Work-up showed that patient meets sepsis criteria with a white count of 16.2.  Some tachycardia as well as lactic acid elevated.  She also has hypoxia with oxygen sats 79% on room air.  She has elevated BNP which is new with findings suggestive of  congestive heart failure but more importantly patient was found to have COVID-19 positive results with new lung lesions concerning for new malignancy.  She is therefore being admitted to the hospital for further evaluation and treatment.     Patient was found to be positive for Covid-19.  Chest xray and CT chest with contrast showed RML and RLL consolidations consistent with ?aspiration pneumonia and a LUL mass.  Admitted and started on remdesevir, steroids, antibiotics.  Pulmonology consulted.  10/26 - comfort care initiated after family meeting with Palliative care. Hospice home in 2 days Dec 12, 2022 - Hospice Home on 10/29 due to covid isolation  Patient passed away comfortably.  Shonica Weier Manuella Ghazi 12-12-20, 4:33 PM

## 2020-12-08 DEATH — deceased

## 2021-01-22 ENCOUNTER — Ambulatory Visit: Payer: Medicare Other | Admitting: Oncology

## 2021-01-22 ENCOUNTER — Other Ambulatory Visit: Payer: Medicare Other
